# Patient Record
Sex: Female | Born: 1955 | Race: White | Hispanic: No | Marital: Married | Smoking: Never smoker
Health system: Southern US, Community
[De-identification: ages and names within clinical notes are randomized; demographics above are authoritative.]

## PROBLEM LIST (undated history)

## (undated) DIAGNOSIS — S02609A Fracture of mandible, unspecified, initial encounter for closed fracture: Secondary | ICD-10-CM

## (undated) DIAGNOSIS — M1990 Unspecified osteoarthritis, unspecified site: Secondary | ICD-10-CM

## (undated) DIAGNOSIS — F32A Depression, unspecified: Secondary | ICD-10-CM

## (undated) DIAGNOSIS — F419 Anxiety disorder, unspecified: Secondary | ICD-10-CM

## (undated) DIAGNOSIS — S0990XA Unspecified injury of head, initial encounter: Secondary | ICD-10-CM

## (undated) DIAGNOSIS — F329 Major depressive disorder, single episode, unspecified: Secondary | ICD-10-CM

## (undated) DIAGNOSIS — H269 Unspecified cataract: Secondary | ICD-10-CM

## (undated) DIAGNOSIS — I10 Essential (primary) hypertension: Secondary | ICD-10-CM

## (undated) HISTORY — DX: Depression, unspecified: F32.A

## (undated) HISTORY — DX: Unspecified injury of head, initial encounter: S09.90XA

## (undated) HISTORY — PX: COLONOSCOPY: SHX174

## (undated) HISTORY — DX: Major depressive disorder, single episode, unspecified: F32.9

## (undated) HISTORY — DX: Anxiety disorder, unspecified: F41.9

## (undated) HISTORY — PX: CATARACT EXTRACTION: SUR2

## (undated) HISTORY — PX: POLYPECTOMY: SHX149

## (undated) HISTORY — DX: Unspecified osteoarthritis, unspecified site: M19.90

## (undated) HISTORY — PX: ABDOMINAL HYSTERECTOMY: SHX81

## (undated) HISTORY — DX: Unspecified cataract: H26.9

## (undated) HISTORY — DX: Essential (primary) hypertension: I10

## (undated) HISTORY — PX: TOTAL HIP ARTHROPLASTY: SHX124

---

## 2004-03-07 ENCOUNTER — Other Ambulatory Visit: Admission: RE | Admit: 2004-03-07 | Discharge: 2004-03-07 | Payer: Self-pay | Admitting: Obstetrics & Gynecology

## 2005-01-27 ENCOUNTER — Ambulatory Visit: Payer: Self-pay | Admitting: Family Medicine

## 2005-02-13 ENCOUNTER — Ambulatory Visit: Payer: Self-pay | Admitting: Family Medicine

## 2005-03-24 ENCOUNTER — Ambulatory Visit: Payer: Self-pay | Admitting: Family Medicine

## 2005-03-24 ENCOUNTER — Encounter: Admission: RE | Admit: 2005-03-24 | Discharge: 2005-03-24 | Payer: Self-pay | Admitting: Family Medicine

## 2005-07-22 ENCOUNTER — Encounter: Admission: RE | Admit: 2005-07-22 | Discharge: 2005-07-22 | Payer: Self-pay | Admitting: Sports Medicine

## 2006-02-24 ENCOUNTER — Ambulatory Visit: Payer: Self-pay | Admitting: Family Medicine

## 2006-06-10 ENCOUNTER — Encounter: Admission: RE | Admit: 2006-06-10 | Discharge: 2006-06-10 | Payer: Self-pay | Admitting: Sports Medicine

## 2007-02-17 ENCOUNTER — Ambulatory Visit: Payer: Self-pay | Admitting: Family Medicine

## 2007-03-11 ENCOUNTER — Ambulatory Visit: Payer: Self-pay | Admitting: Family Medicine

## 2007-03-11 LAB — CONVERTED CEMR LAB
ALT: 25 units/L (ref 0–40)
AST: 21 units/L (ref 0–37)
Albumin: 3.8 g/dL (ref 3.5–5.2)
BUN: 7 mg/dL (ref 6–23)
Basophils Absolute: 0 10*3/uL (ref 0.0–0.1)
Eosinophils Absolute: 0.2 10*3/uL (ref 0.0–0.6)
Eosinophils Relative: 2.6 % (ref 0.0–5.0)
GFR calc Af Amer: 98 mL/min
GFR calc non Af Amer: 81 mL/min
HDL: 45.8 mg/dL (ref >39.0)
Hemoglobin: 15.7 g/dL — ABNORMAL HIGH (ref 12.0–15.0)
LDL Cholesterol: 46 mg/dL (ref 0–99)
Lymphocytes Relative: 15.5 % (ref 12.0–46.0)
MCHC: 34.3 g/dL (ref 30.0–36.0)
Monocytes Relative: 10.9 % (ref 3.0–11.0)
Neutro Abs: 4.2 10*3/uL (ref 1.4–7.7)
Neutrophils Relative %: 70.8 % (ref 43.0–77.0)
Platelets: 345 10*3/uL (ref 150–400)
Potassium: 3.9 meq/L (ref 3.5–5.1)
RBC: 4.91 M/uL (ref 3.87–5.11)
RDW: 12.4 % (ref 11.5–14.6)
TSH: 1.91 microintl units/mL (ref 0.35–5.50)
Total CHOL/HDL Ratio: 2.4
Total Protein: 6.7 g/dL (ref 6.0–8.3)
Triglycerides: 94 mg/dL (ref 0–149)
VLDL: 19 mg/dL (ref 0–40)

## 2007-04-05 ENCOUNTER — Ambulatory Visit: Payer: Self-pay | Admitting: Internal Medicine

## 2007-04-05 LAB — CONVERTED CEMR LAB
Amylase: 30 units/L (ref 27–131)
Basophils Relative: 0.3 % (ref 0.0–1.0)
H Pylori IgG: NEGATIVE
Hemoglobin: 15.3 g/dL — ABNORMAL HIGH (ref 12.0–15.0)
Lipase: 19 units/L (ref 11.0–59.0)
Lymphocytes Relative: 14.8 % (ref 12.0–46.0)
MCHC: 34.5 g/dL (ref 30.0–36.0)
Platelets: 338 10*3/uL (ref 150–400)
RBC: 4.7 M/uL (ref 3.87–5.11)
RDW: 12.5 % (ref 11.5–14.6)
WBC: 6.4 10*3/uL (ref 4.5–10.5)

## 2007-04-20 ENCOUNTER — Encounter: Admission: RE | Admit: 2007-04-20 | Discharge: 2007-04-20 | Payer: Self-pay | Admitting: Family Medicine

## 2007-04-21 ENCOUNTER — Ambulatory Visit: Payer: Self-pay | Admitting: Internal Medicine

## 2007-05-03 DIAGNOSIS — F418 Other specified anxiety disorders: Secondary | ICD-10-CM

## 2007-05-03 DIAGNOSIS — Z9079 Acquired absence of other genital organ(s): Secondary | ICD-10-CM | POA: Insufficient documentation

## 2007-05-03 DIAGNOSIS — F329 Major depressive disorder, single episode, unspecified: Secondary | ICD-10-CM | POA: Insufficient documentation

## 2007-05-03 DIAGNOSIS — J309 Allergic rhinitis, unspecified: Secondary | ICD-10-CM | POA: Insufficient documentation

## 2007-05-21 ENCOUNTER — Encounter: Payer: Self-pay | Admitting: Family Medicine

## 2007-06-02 ENCOUNTER — Encounter: Admission: RE | Admit: 2007-06-02 | Discharge: 2007-06-02 | Payer: Self-pay | Admitting: Occupational Medicine

## 2007-06-28 ENCOUNTER — Ambulatory Visit (HOSPITAL_COMMUNITY): Admission: RE | Admit: 2007-06-28 | Discharge: 2007-06-28 | Payer: Self-pay | Admitting: *Deleted

## 2007-06-28 ENCOUNTER — Encounter (INDEPENDENT_AMBULATORY_CARE_PROVIDER_SITE_OTHER): Payer: Self-pay | Admitting: *Deleted

## 2007-07-20 ENCOUNTER — Encounter: Payer: Self-pay | Admitting: Family Medicine

## 2007-11-12 ENCOUNTER — Ambulatory Visit: Payer: Self-pay | Admitting: Family Medicine

## 2007-11-12 DIAGNOSIS — J019 Acute sinusitis, unspecified: Secondary | ICD-10-CM | POA: Insufficient documentation

## 2007-12-02 ENCOUNTER — Ambulatory Visit: Payer: Self-pay | Admitting: Internal Medicine

## 2007-12-02 ENCOUNTER — Telehealth (INDEPENDENT_AMBULATORY_CARE_PROVIDER_SITE_OTHER): Payer: Self-pay | Admitting: *Deleted

## 2007-12-02 DIAGNOSIS — R300 Dysuria: Secondary | ICD-10-CM | POA: Insufficient documentation

## 2007-12-02 DIAGNOSIS — N390 Urinary tract infection, site not specified: Secondary | ICD-10-CM | POA: Insufficient documentation

## 2007-12-02 LAB — CONVERTED CEMR LAB
Bilirubin Urine: NEGATIVE
Nitrite: POSITIVE
Protein, U semiquant: NEGATIVE
Specific Gravity, Urine: 1.02
pH: 6.5

## 2007-12-04 ENCOUNTER — Encounter: Payer: Self-pay | Admitting: Internal Medicine

## 2007-12-10 ENCOUNTER — Telehealth (INDEPENDENT_AMBULATORY_CARE_PROVIDER_SITE_OTHER): Payer: Self-pay | Admitting: *Deleted

## 2007-12-16 HISTORY — PX: CHOLECYSTECTOMY: SHX55

## 2007-12-22 ENCOUNTER — Telehealth (INDEPENDENT_AMBULATORY_CARE_PROVIDER_SITE_OTHER): Payer: Self-pay | Admitting: *Deleted

## 2008-06-12 ENCOUNTER — Telehealth (INDEPENDENT_AMBULATORY_CARE_PROVIDER_SITE_OTHER): Payer: Self-pay | Admitting: *Deleted

## 2008-06-12 ENCOUNTER — Ambulatory Visit: Payer: Self-pay | Admitting: Family Medicine

## 2008-06-12 DIAGNOSIS — M79609 Pain in unspecified limb: Secondary | ICD-10-CM

## 2008-06-12 DIAGNOSIS — M109 Gout, unspecified: Secondary | ICD-10-CM | POA: Insufficient documentation

## 2008-06-13 ENCOUNTER — Encounter: Payer: Self-pay | Admitting: Family Medicine

## 2008-06-14 ENCOUNTER — Encounter (INDEPENDENT_AMBULATORY_CARE_PROVIDER_SITE_OTHER): Payer: Self-pay | Admitting: *Deleted

## 2008-06-15 ENCOUNTER — Encounter (INDEPENDENT_AMBULATORY_CARE_PROVIDER_SITE_OTHER): Payer: Self-pay | Admitting: *Deleted

## 2008-07-18 ENCOUNTER — Ambulatory Visit: Payer: Self-pay | Admitting: Internal Medicine

## 2008-07-22 ENCOUNTER — Encounter: Payer: Self-pay | Admitting: Internal Medicine

## 2008-10-02 ENCOUNTER — Telehealth (INDEPENDENT_AMBULATORY_CARE_PROVIDER_SITE_OTHER): Payer: Self-pay | Admitting: *Deleted

## 2008-10-02 ENCOUNTER — Ambulatory Visit: Payer: Self-pay | Admitting: Family Medicine

## 2008-10-02 DIAGNOSIS — I10 Essential (primary) hypertension: Secondary | ICD-10-CM

## 2008-10-10 ENCOUNTER — Encounter (INDEPENDENT_AMBULATORY_CARE_PROVIDER_SITE_OTHER): Payer: Self-pay | Admitting: *Deleted

## 2008-10-25 ENCOUNTER — Ambulatory Visit: Payer: Self-pay | Admitting: Family Medicine

## 2008-10-29 LAB — CONVERTED CEMR LAB
ALT: 18 units/L (ref 0–35)
AST: 16 units/L (ref 0–37)
Albumin: 3.5 g/dL (ref 3.5–5.2)
Alkaline Phosphatase: 60 units/L (ref 39–117)
HDL: 42.3 mg/dL (ref >39.0)
LDL Cholesterol: 46 mg/dL (ref 0–99)
Total Protein: 6.6 g/dL (ref 6.0–8.3)
Triglycerides: 77 mg/dL (ref 0–149)
VLDL: 15 mg/dL (ref 0–40)

## 2008-10-30 ENCOUNTER — Encounter (INDEPENDENT_AMBULATORY_CARE_PROVIDER_SITE_OTHER): Payer: Self-pay | Admitting: *Deleted

## 2008-11-29 LAB — HM MAMMOGRAPHY

## 2009-02-13 ENCOUNTER — Ambulatory Visit: Payer: Self-pay | Admitting: Family Medicine

## 2009-02-21 ENCOUNTER — Telehealth (INDEPENDENT_AMBULATORY_CARE_PROVIDER_SITE_OTHER): Payer: Self-pay | Admitting: *Deleted

## 2009-05-11 ENCOUNTER — Ambulatory Visit: Payer: Self-pay | Admitting: Family Medicine

## 2009-05-12 ENCOUNTER — Encounter: Payer: Self-pay | Admitting: Family Medicine

## 2009-05-23 ENCOUNTER — Telehealth: Payer: Self-pay | Admitting: Family Medicine

## 2009-06-05 ENCOUNTER — Ambulatory Visit: Payer: Self-pay | Admitting: Family Medicine

## 2009-06-07 ENCOUNTER — Encounter (INDEPENDENT_AMBULATORY_CARE_PROVIDER_SITE_OTHER): Payer: Self-pay | Admitting: *Deleted

## 2009-08-07 ENCOUNTER — Ambulatory Visit: Payer: Self-pay | Admitting: Family Medicine

## 2009-08-28 ENCOUNTER — Telehealth: Payer: Self-pay | Admitting: Family Medicine

## 2009-08-30 ENCOUNTER — Ambulatory Visit: Payer: Self-pay | Admitting: Family Medicine

## 2009-09-20 ENCOUNTER — Ambulatory Visit: Payer: Self-pay | Admitting: Family Medicine

## 2009-09-20 DIAGNOSIS — M2550 Pain in unspecified joint: Secondary | ICD-10-CM | POA: Insufficient documentation

## 2009-10-03 ENCOUNTER — Telehealth (INDEPENDENT_AMBULATORY_CARE_PROVIDER_SITE_OTHER): Payer: Self-pay | Admitting: *Deleted

## 2009-10-05 ENCOUNTER — Ambulatory Visit: Payer: Self-pay | Admitting: Family Medicine

## 2009-10-05 LAB — CONVERTED CEMR LAB
Glucose, Urine, Semiquant: NEGATIVE
Protein, U semiquant: NEGATIVE

## 2009-10-06 ENCOUNTER — Encounter: Payer: Self-pay | Admitting: Family Medicine

## 2009-10-09 ENCOUNTER — Telehealth: Payer: Self-pay | Admitting: Family Medicine

## 2009-10-12 ENCOUNTER — Ambulatory Visit: Payer: Self-pay | Admitting: Family Medicine

## 2009-10-12 LAB — CONVERTED CEMR LAB
ALT: 22 units/L (ref 0–35)
AST: 18 units/L (ref 0–37)
Albumin: 3.7 g/dL (ref 3.5–5.2)
BUN: 11 mg/dL (ref 6–23)
Basophils Absolute: 0 10*3/uL (ref 0.0–0.1)
Basophils Relative: 0.6 % (ref 0.0–3.0)
Bilirubin, Direct: 0 mg/dL (ref 0.0–0.3)
Calcium: 8.5 mg/dL (ref 8.4–10.5)
Chloride: 107 meq/L (ref 96–112)
Eosinophils Absolute: 0.1 10*3/uL (ref 0.0–0.7)
GFR calc non Af Amer: 93.08 mL/min (ref >60)
Glucose, Bld: 88 mg/dL (ref 70–99)
HCT: 44 % (ref 36.0–46.0)
HDL: 43.3 mg/dL (ref >39.00)
Hemoglobin: 15.2 g/dL — ABNORMAL HIGH (ref 12.0–15.0)
LDL Cholesterol: 37 mg/dL (ref 0–99)
Lymphocytes Relative: 15.5 % (ref 12.0–46.0)
Lymphs Abs: 1.1 10*3/uL (ref 0.7–4.0)
Monocytes Relative: 7.5 % (ref 3.0–12.0)
Neutrophils Relative %: 75.1 % (ref 43.0–77.0)
Potassium: 3.7 meq/L (ref 3.5–5.1)
RBC: 4.45 M/uL (ref 3.87–5.11)
RDW: 11.6 % (ref 11.5–14.6)
TSH: 1.76 microintl units/mL (ref 0.35–5.50)
Total Bilirubin: 0.6 mg/dL (ref 0.3–1.2)
Total CHOL/HDL Ratio: 2
Total Protein: 6.6 g/dL (ref 6.0–8.3)
Uric Acid, Serum: 5.7 mg/dL (ref 2.4–7.0)
VLDL: 22.6 mg/dL (ref 0.0–40.0)
WBC: 7.2 10*3/uL (ref 4.5–10.5)

## 2009-10-15 ENCOUNTER — Telehealth (INDEPENDENT_AMBULATORY_CARE_PROVIDER_SITE_OTHER): Payer: Self-pay | Admitting: *Deleted

## 2009-10-15 ENCOUNTER — Encounter (INDEPENDENT_AMBULATORY_CARE_PROVIDER_SITE_OTHER): Payer: Self-pay | Admitting: *Deleted

## 2009-10-16 ENCOUNTER — Encounter (INDEPENDENT_AMBULATORY_CARE_PROVIDER_SITE_OTHER): Payer: Self-pay | Admitting: *Deleted

## 2009-11-02 ENCOUNTER — Telehealth: Payer: Self-pay | Admitting: Family Medicine

## 2009-12-03 ENCOUNTER — Telehealth: Payer: Self-pay | Admitting: Family Medicine

## 2009-12-21 ENCOUNTER — Ambulatory Visit: Payer: Self-pay | Admitting: Family Medicine

## 2010-01-22 ENCOUNTER — Ambulatory Visit: Payer: Self-pay | Admitting: Family Medicine

## 2010-02-06 ENCOUNTER — Telehealth: Payer: Self-pay | Admitting: Family Medicine

## 2010-02-06 ENCOUNTER — Ambulatory Visit: Payer: Self-pay | Admitting: Internal Medicine

## 2010-02-06 DIAGNOSIS — L299 Pruritus, unspecified: Secondary | ICD-10-CM | POA: Insufficient documentation

## 2010-02-07 ENCOUNTER — Telehealth (INDEPENDENT_AMBULATORY_CARE_PROVIDER_SITE_OTHER): Payer: Self-pay | Admitting: *Deleted

## 2010-02-07 ENCOUNTER — Encounter: Payer: Self-pay | Admitting: Internal Medicine

## 2010-04-12 ENCOUNTER — Emergency Department (HOSPITAL_COMMUNITY): Admission: EM | Admit: 2010-04-12 | Discharge: 2010-04-12 | Payer: Self-pay | Admitting: Family Medicine

## 2010-04-12 ENCOUNTER — Telehealth (INDEPENDENT_AMBULATORY_CARE_PROVIDER_SITE_OTHER): Payer: Self-pay | Admitting: *Deleted

## 2010-06-04 ENCOUNTER — Ambulatory Visit: Payer: Self-pay | Admitting: Family Medicine

## 2010-06-04 DIAGNOSIS — R51 Headache: Secondary | ICD-10-CM

## 2010-06-04 DIAGNOSIS — R519 Headache, unspecified: Secondary | ICD-10-CM | POA: Insufficient documentation

## 2010-06-26 ENCOUNTER — Ambulatory Visit: Payer: Self-pay | Admitting: Family Medicine

## 2010-06-28 LAB — CONVERTED CEMR LAB
BUN: 18 mg/dL (ref 6–23)
CO2: 25 meq/L (ref 19–32)
Chloride: 105 meq/L (ref 96–112)

## 2010-07-03 ENCOUNTER — Telehealth (INDEPENDENT_AMBULATORY_CARE_PROVIDER_SITE_OTHER): Payer: Self-pay | Admitting: *Deleted

## 2010-07-08 ENCOUNTER — Ambulatory Visit: Payer: Self-pay | Admitting: Family Medicine

## 2010-07-08 ENCOUNTER — Encounter (INDEPENDENT_AMBULATORY_CARE_PROVIDER_SITE_OTHER): Payer: Self-pay | Admitting: *Deleted

## 2010-07-09 LAB — CONVERTED CEMR LAB
Calcium: 9.1 mg/dL (ref 8.4–10.5)
Chloride: 106 meq/L (ref 96–112)
Glucose, Bld: 88 mg/dL (ref 70–99)
Potassium: 4.3 meq/L (ref 3.5–5.1)
Sodium: 139 meq/L (ref 135–145)

## 2010-07-19 ENCOUNTER — Encounter (INDEPENDENT_AMBULATORY_CARE_PROVIDER_SITE_OTHER): Payer: Self-pay

## 2010-07-23 ENCOUNTER — Ambulatory Visit: Payer: Self-pay | Admitting: Gastroenterology

## 2010-07-31 ENCOUNTER — Ambulatory Visit: Payer: Self-pay | Admitting: Gastroenterology

## 2010-08-02 ENCOUNTER — Encounter: Payer: Self-pay | Admitting: Gastroenterology

## 2010-08-12 ENCOUNTER — Telehealth (INDEPENDENT_AMBULATORY_CARE_PROVIDER_SITE_OTHER): Payer: Self-pay | Admitting: *Deleted

## 2010-09-16 ENCOUNTER — Ambulatory Visit: Payer: Self-pay | Admitting: Family Medicine

## 2010-11-04 ENCOUNTER — Telehealth (INDEPENDENT_AMBULATORY_CARE_PROVIDER_SITE_OTHER): Payer: Self-pay | Admitting: *Deleted

## 2011-01-12 LAB — CONVERTED CEMR LAB
Anti Nuclear Antibody(ANA): NEGATIVE
BUN: 11 mg/dL (ref 6–23)
Chloride: 103 meq/L (ref 96–112)
Creatinine, Ser: 0.7 mg/dL (ref 0.4–1.2)
Pap Smear: NORMAL
Potassium: 3.4 meq/L — ABNORMAL LOW (ref 3.5–5.1)
Sed Rate: 22 mm/hr (ref 0–22)
Sodium: 141 meq/L (ref 135–145)

## 2011-01-14 NOTE — Assessment & Plan Note (Signed)
Summary: FOR A BP CHECK//PH   Vital Signs:  Patient profile:   55 year old female Weight:      229 pounds Temp:     98.4 degrees F oral Pulse rate:   78 / minute Pulse rhythm:   regular BP sitting:   132 / 86  (left arm) Cuff size:   large  Vitals Entered By: Army Fossa CMA (January 22, 2010 4:06 PM) CC: Pt here for follow up on BP    History of Present Illness:  Hypertension follow-up      This is a 55 year old woman who presents for Hypertension follow-up.  The patient denies lightheadedness, urinary frequency, headaches, edema, impotence, rash, and fatigue.  The patient denies the following associated symptoms: chest pain, chest pressure, exercise intolerance, dyspnea, palpitations, syncope, leg edema, and pedal edema.  Compliance with medications (by patient report) has been sporadic.  The patient reports that dietary compliance has been fair.  Adjunctive measures currently used by the patient include salt restriction.    Current Medications (verified): 1)  Cymbalta 30 Mg  Cpep (Duloxetine Hcl) .... Take Three Times A Day 2)  Wellbutrin Xl 150 Mg Xr24h-Tab (Bupropion Hcl) .Marland Kitchen.. 1 By Mouth Once Daily 3)  Ambien 10 Mg  Tabs (Zolpidem Tartrate) .... Prn 4)  Adderall Xr 10 Mg Xr24h-Cap (Amphetamine-Dextroamphetamine) .... Take One Tablet Daily. 5)  Claritin 10 Mg Tabs (Loratadine) .Marland Kitchen.. 1 By Mouth Once Daily 6)  Diovan Hct 320-12.5 Mg Tabs (Valsartan-Hydrochlorothiazide) .Marland Kitchen.. 1 By Mouth Once Daily 7)  Vitamin D (Ergocalciferol) 50000 Unit Caps (Ergocalciferol) .... Take 1 Tab Weekly 8)  Celebrex 200 Mg Caps (Celecoxib) .Marland Kitchen.. 1 By Mouth Once Daily Prn  Allergies (verified): No Known Drug Allergies  Past History:  Past medical, surgical, family and social histories (including risk factors) reviewed for relevance to current acute and chronic problems.  Past Medical History: Reviewed history from 10/02/2008 and no changes required. Depression Allergic  rhinitis Hypertension  Family History: Reviewed history from 10/02/2008 and no changes required. M-- CLL Family History Hypertension  Social History: Reviewed history from 10/02/2008 and no changes required. 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 Lungs are clear to auscultation, no crackles or wheezes. Heart:  normal rate and no murmur.   Extremities:  No clubbing, cyanosis, edema, or deformity noted with normal full range of motion of all joints.   Psych:  Oriented X3 and normally interactive.     Impression & Recommendations:  Problem # 1:  HYPERTENSION (ICD-401.9)  Her updated medication list for this problem includes:    Diovan Hct 320-12.5 Mg Tabs (Valsartan-hydrochlorothiazide) .Marland Kitchen... 1 by mouth once daily  BP today: 132/86 Prior BP: 126/86 (12/21/2009)  Labs Reviewed: K+: 3.7 (10/05/2009) Creat: : 0.7 (10/05/2009)   Chol: 103 (10/05/2009)   HDL: 43.30 (10/05/2009)   LDL: 37 (10/05/2009)   TG: 113.0 (10/05/2009)  Complete Medication List: 1)  Cymbalta 30 Mg Cpep (Duloxetine hcl) .... Take three times a day 2)  Wellbutrin Xl 150 Mg Xr24h-tab (Bupropion hcl) .Marland Kitchen.. 1 by mouth once daily 3)  Ambien 10 Mg Tabs (Zolpidem tartrate) .... Prn 4)  Adderall Xr 10 Mg Xr24h-cap (Amphetamine-dextroamphetamine) .... Take one tablet daily. 5)  Claritin 10 Mg Tabs (Loratadine) .Marland Kitchen.. 1 by mouth once daily 6)  Diovan Hct 320-12.5 Mg Tabs (Valsartan-hydrochlorothiazide) .Marland Kitchen.. 1 by mouth once daily 7)  Vitamin D (ergocalciferol) 50000 Unit Caps (Ergocalciferol) .... Take 1 tab weekly 8)  Celebrex 200 Mg Caps (Celecoxib) .Marland Kitchen.. 1 by mouth once daily prn  Patient Instructions: 1)  rto 2-3 weeks Prescriptions: DIOVAN  HCT 320-12.5 MG TABS (VALSARTAN-HYDROCHLOROTHIAZIDE) 1 by mouth once daily  #30 x 0   Entered and Authorized by:   Loreen Freud DO   Signed by:   Loreen Freud DO on 01/22/2010   Method used:   Historical   RxID:   [id number]

## 2011-01-14 NOTE — Assessment & Plan Note (Signed)
Summary: rto bp/cbs   Vital Signs:  Patient profile:   55 year old female Weight:      223 pounds Pulse rate:   114 / minute BP sitting:   140 / 98  (left arm)  Vitals Entered By: Doristine Devoid CMA (July 08, 2010 1:21 PM) CC: roa bp    History of Present Illness: 55 yo woman here today to f/u BP.  HAs improved w/ addition Losartan, especially after doubling meds.  BP still running 150s/90s.  no CP, SOB, visual changes, edema.  has never been on HCTZ.  Current Medications (verified): 1)  Cymbalta 30 Mg  Cpep (Duloxetine Hcl) .... Take Three Times A Day 2)  Wellbutrin Xl 150 Mg Xr24h-Tab (Bupropion Hcl) .Marland Kitchen.. 1 By Mouth Once Daily 3)  Ambien 10 Mg  Tabs (Zolpidem Tartrate) .... Prn 4)  Adderall Xr 10 Mg Xr24h-Cap (Amphetamine-Dextroamphetamine) .... Take One Tablet Daily. 5)  Vitamin D (Ergocalciferol) 50000 Unit Caps (Ergocalciferol) .... Take 1 Tab Weekly 6)  Fexofenadine Hcl 180 Mg Tabs (Fexofenadine Hcl) .Marland Kitchen.. 1 Once Daily 7)  Knee Sleeve R Knee .... Dx Knee Pain 8)  Losartan Potassium 100 Mg Tabs (Losartan Potassium) .... Take One Tablet Daily 9)  Hydrochlorothiazide 12.5 Mg  Tabs (Hydrochlorothiazide) .... Take 1 Tab  By Mouth Every Morning  Allergies (verified): 1)  ! Lisinopril (Lisinopril) 2)  ! Metoprolol Tartrate (Metoprolol Tartrate) 3)  ! 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 Lungs are clear to auscultation, no crackles or wheezes. Heart:  normal rate and no murmur.   Pulses:  +2 carotid, radial,  DP Extremities:  no C/C/E   Impression & Recommendations:  Problem # 1:  HYPERTENSION (ICD-401.9) Assessment Unchanged pt now asymptomatic but BP control not adequate.  add HCTZ and follow closely.  repeat BMP to assess Cr since starting ARB. Her updated medication list for this problem includes:    Losartan Potassium 100 Mg Tabs (Losartan potassium) .Marland Kitchen... Take one tablet daily    Hydrochlorothiazide 12.5 Mg Tabs (Hydrochlorothiazide) .Marland Kitchen... Take 1 tab  by mouth every morning  Orders: Venipuncture (04540) Specimen Handling (98119) TLB-BMP (Basic Metabolic Panel-BMET) (80048-METABOL)  Problem # 2:  SCREENING, COLON CANCER (ICD-V76.51) Assessment: New needs referral, would like to have this done before school starts. Orders: Gastroenterology Referral (GI)  Complete Medication List: 1)  Cymbalta 30 Mg Cpep (Duloxetine hcl) .... Take three times a day 2)  Wellbutrin Xl 150 Mg Xr24h-tab (Bupropion hcl) .Marland Kitchen.. 1 by mouth once daily 3)  Ambien 10 Mg Tabs (Zolpidem tartrate) .... Prn 4)  Adderall Xr 10 Mg Xr24h-cap (Amphetamine-dextroamphetamine) .... Take one tablet daily. 5)  Vitamin D (ergocalciferol) 50000 Unit Caps (Ergocalciferol) .... Take 1 tab weekly 6)  Fexofenadine Hcl 180 Mg Tabs (Fexofenadine hcl) .Marland Kitchen.. 1 once daily 7)  Knee Sleeve R Knee  .... Dx knee pain 8)  Losartan Potassium 100 Mg Tabs (Losartan potassium) .... Take one tablet daily 9)  Hydrochlorothiazide 12.5 Mg Tabs (Hydrochlorothiazide) .... Take 1 tab  by mouth every morning  Patient Instructions: 1)  Please schedule a follow-up appointment in 1 month to recheck BP. 2)  Continue the Losartan 3)  Add the Hydrochlorothiazide 4)  Continue to drink plenty of fluids 5)  Someone will call you with your GI appt 6)  Hang in there!!  Prescriptions: HYDROCHLOROTHIAZIDE 12.5 MG  TABS (HYDROCHLOROTHIAZIDE) Take 1 tab  by mouth every morning  #30 x 3   Entered and Authorized by:   Neena Rhymes MD   Signed by:    Neena Rhymes MD on 07/08/2010   Method used:   Electronically to        [facility] [address] #[id number]* (retail)       [address]       [place], [place]  [address]       Ph: [phone]       Fax: [phone]   RxID:   [id number]

## 2011-01-14 NOTE — Procedures (Signed)
Summary: Colonoscopy  Patient: Diana Vincent Note: All result statuses are Final unless otherwise noted.  Tests: (1) Colonoscopy (COL)   COL Colonoscopy           DONE     [facility]     [address]     [place], [place]  [address]           COLONOSCOPY PROCEDURE REPORT           PATIENT:  Diana Vincent, Diana Vincent  MR#:  [id number]     BIRTHDATE:  19-Apr-1956, 53 yrs. old  GENDER:  female     ENDOSCOPIST:  Vania Rea. Jarold Motto, MD, [facility]     REF. BY:  Marga Melnick, M.D.     PROCEDURE DATE:  07/31/2010     PROCEDURE:  Colonoscopy with snare polypectomy     ASA CLASS:  Class II     INDICATIONS:  Routine Risk Screening     MEDICATIONS:   Fentanyl 75 mcg IV, Versed 7 mg IV           DESCRIPTION OF PROCEDURE:   After the risks benefits and     alternatives of the procedure were thoroughly explained, informed     consent was obtained.  Digital rectal exam was performed and     revealed no abnormalities.   The LB [id number] endoscope     was introduced through the anus and advanced to the cecum, which     was identified by both the appendix and ileocecal valve, without     limitations.  The quality of the prep was adequate, using     MoviPrep.  The instrument was then slowly withdrawn as the colon     was fully examined.     <<PROCEDUREIMAGES>>           FINDINGS:  A sessile polyp was found in the descending colon. 4mm     flat polyp cold snare excised.  Scattered diverticula were found     in the sigmoid colon.  This was otherwise a normal examination of     the colon.   Retroflexed views in the rectum revealed no     abnormalities.    The scope was then withdrawn from the patient     and the procedure completed.           COMPLICATIONS:  None     ENDOSCOPIC IMPRESSION:     1) Sessile polyp in the descending colon     2) Diverticula, scattered in the sigmoid colon     3) Otherwise normal examination     R/O ADENOMA.     RECOMMENDATIONS:     1) Repeat colonoscopy in 5 years  if polyp adenomatous; otherwise     10 years     2) metamucil or benefiber     3) high fiber diet     REPEAT EXAM:  No           ______________________________     Vania Rea. Jarold Motto, MD, [facility]           CC:           n.     eSIGNED:   Vania Rea. Patterson at 07/31/2010 11:27 AM           [name], [name], [id number]  Note: An exclamation mark (!) indicates a result that was not dispersed into the flowsheet. Document Creation Date: 07/31/2010 11:29 AM _______________________________________________________________________  (1) Order result status: Final Collection  or observation date-time: 07/31/2010 11:21 Requested date-time:  Receipt date-time:  Reported date-time:  Referring Physician:   Ordering Physician: Sheryn Bison [phone]) Specimen Source:  Source: Launa Grill Order Number: [id number] Lab site:   Appended Document: Colonoscopy CC: Dr. Beverely Low  Appended Document: Colonoscopy 5Y F/U  Appended Document: Colonoscopy     Procedures Next Due Date:    Colonoscopy: 07/2015

## 2011-01-14 NOTE — Progress Notes (Signed)
Summary: BP MED NOT WORKING  Phone Note Call from Patient Call back at Home Phone [phone]   Caller: Patient Call For: Loreen Freud DO Reason for Call: Talk to Nurse Summary of Call: PATIENT CALLING, WANTS AN APPT FOR BP RECHECK, STATES THE CURRENT BP MED DIOVAN SHE IS ON IS "WEARING HER OUT".  PATIENT IS ALSO SICK W/FLU LIKE SYPMTOMS.  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 Initial call taken by: Magdalen Spatz [facility],  February 06, 2010 9:45 AM  Follow-up for Phone Call        pt c/o of itching all over body and scalp, falling into deep sleep easily since starting diovan. pt states that all symptoms started a week after starting new BP med. Diovan is controlling BP. pt does have pending OV this afternoon with dr hopper to discuss reaction to med. pls advise ....................Marland KitchenFelecia Deloach CMA  February 06, 2010 10:01 AM

## 2011-01-14 NOTE — Progress Notes (Signed)
Summary: wants to increase bp med dose  Phone Note Call from Patient Call back at Home Phone [phone]   Summary of Call: patient started taking losartin 50 mg - bp 155/98 she wants to know if she can increase dose Initial call taken by: Okey Regal Spring,  July 03, 2010 4:44 PM  Follow-up for Phone Call        can double up and take 2 pills (100mg  daily) Follow-up by: Neena Rhymes MD,  July 03, 2010 4:50 PM  Additional Follow-up for Phone Call Additional follow up Details #1::        spoke w/ patient aware she can increase to 100mg  of losartan and let us know how that improves bp....Marland KitchenMarland KitchenDoristine Devoid CMA  July 03, 2010 4:55 PM     New/Updated Medications: LOSARTAN POTASSIUM 100 MG TABS (LOSARTAN POTASSIUM) take one tablet daily

## 2011-01-14 NOTE — Letter (Signed)
Summary: Patient Notice- Polyp Results  [facility]  [address] [place], [place] [address]   Phone: [phone]  Fax: [phone]        August 02, 2010 MRN: [id number]    [facility] [address] [place], [place]  [address]    Dear Ms. Vane,  I am pleased to inform you that the colon polyp(s) removed during your recent colonoscopy was (were) found to be benign (no cancer detected) upon pathologic examination.  I recommend you have a repeat colonoscopy examination in 5_ years to look for recurrent polyps, as having colon polyps increases your risk for having recurrent polyps or even colon cancer in the future.  Should you develop new or worsening symptoms of abdominal pain, bowel habit changes or bleeding from the rectum or bowels, please schedule an evaluation with either your primary care physician or with me.  Additional information/recommendations:  _X_ No further action with gastroenterology is needed at this time. Please      follow-up with your primary care physician for your other healthcare      needs.  __ Please call [phone] to schedule a return visit to review your      situation.  __ Please keep your follow-up visit as already scheduled.  __ Continue treatment plan as outlined the day of your exam.  Please call us if you are having persistent problems or have questions about your condition that have not been fully answered at this time.  Sincerely,  Mardella Layman MD [facility]  This letter has been electronically signed by your physician.  Appended Document: Patient Notice- Polyp Results letter mailed

## 2011-01-14 NOTE — Progress Notes (Signed)
Summary: hctz refill   Phone Note Refill Request Message from:  Fax from Pharmacy on November 04, 2010 8:29 AM  Refills Requested: Medication #1:  HYDROCHLOROTHIAZIDE 12.5 MG  TABS Take 1 tab  by mouth every morning. [facility] [id number] Jani Files [id number]  Initial call taken by: Okey Regal Spring,  November 04, 2010 8:30 AM    Prescriptions: HYDROCHLOROTHIAZIDE 12.5 MG  TABS (HYDROCHLOROTHIAZIDE) Take 1 tab  by mouth every morning  #30 x 6   Entered by:   Doristine Devoid CMA   Authorized by:   Neena Rhymes MD   Signed by:   Doristine Devoid CMA on 11/04/2010   Method used:   Electronically to        [facility] [address] #[id number]* (retail)       [address]       [place], [place]  [address]       Ph: [phone]       Fax: [phone]   RxID:   [id number]

## 2011-01-14 NOTE — Assessment & Plan Note (Signed)
Summary: FOR A BP CHECK//PH   Vital Signs:  Patient profile:   55 year old female Weight:      229 pounds Temp:     100 degrees F oral Pulse rate:   77 / minute Pulse rhythm:   regular BP sitting:   126 / 86  (left arm) Cuff size:   large  Vitals Entered By: Army Fossa CMA (December 21, 2009 3:57 PM) CC: check bp   History of Present Illness:  Hypertension follow-up      This is a 55 year old woman who presents for Hypertension follow-up.  The patient denies lightheadedness, urinary frequency, headaches, edema, impotence, rash, and fatigue.  Associated symptoms include leg edema.  The patient denies the following associated symptoms: chest pain, chest pressure, exercise intolerance, dyspnea, palpitations, syncope, and pedal edema.  Compliance with medications (by patient report) has been near 100%.  The patient reports that dietary compliance has been good.  Adjunctive measures currently used by the patient include salt restriction.    Current Medications (verified): 1)  Cymbalta 30 Mg  Cpep (Duloxetine Hcl) .... Take Three Times A Day 2)  Wellbutrin Xl 300 Mg  Tb24 (Bupropion Hcl) .... Take One Tablet Daily 3)  Ambien 10 Mg  Tabs (Zolpidem Tartrate) .... Prn 4)  Adderall Xr 10 Mg Xr24h-Cap (Amphetamine-Dextroamphetamine) .... Take One Tablet Daily. 5)  Claritin 10 Mg Tabs (Loratadine) .Marland Kitchen.. 1 By Mouth Once Daily 6)  Diovan Hct 160-12.5 Mg Tabs (Valsartan-Hydrochlorothiazide) .Marland Kitchen.. 1 By Mouth Once Daily 7)  Vitamin D (Ergocalciferol) 50000 Unit Caps (Ergocalciferol) .... Take 1 Tab Weekly 8)  Celebrex 200 Mg Caps (Celecoxib) .Marland Kitchen.. 1 By Mouth Once Daily Prn  Allergies (verified): No Known Drug Allergies  Past History:  Past medical, surgical, family and social histories (including risk factors) reviewed for relevance to current acute and chronic problems.  Past Medical History: Reviewed history from 10/02/2008 and no changes required. Depression Allergic  rhinitis Hypertension  Family History: Reviewed history from 10/02/2008 and no changes required. M-- CLL Family History Hypertension  Social History: Reviewed history from 10/02/2008 and no changes required. 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   Psych:  Oriented X3 and normally interactive.     Impression & Recommendations:  Problem # 1:  HYPERTENSION (ICD-401.9)  The following medications were removed from the medication list:    Toprol Xl 50 Mg Xr24h-tab (Metoprolol succinate) .Marland Kitchen... 1/2  by mouth once daily Her updated medication list for this problem includes:    Diovan Hct 160-12.5 Mg Tabs (Valsartan-hydrochlorothiazide) .Marland Kitchen... 1 by mouth once daily  Problem # 2:  PAIN IN JOINT, MULTIPLE SITES (ICD-719.49) ? OA-- celebrex 200mg  once daily---    Complete Medication List: 1)  Cymbalta 30 Mg Cpep (Duloxetine hcl) .... Take three times a day 2)  Wellbutrin Xl 300 Mg Tb24 (Bupropion hcl) .... Take one tablet daily 3)  Ambien 10 Mg Tabs (Zolpidem tartrate) .... Prn 4)  Adderall Xr 10 Mg Xr24h-cap (Amphetamine-dextroamphetamine) .... Take one tablet daily. 5)  Claritin 10 Mg Tabs (Loratadine) .Marland Kitchen.. 1 by mouth once daily 6)  Diovan Hct 160-12.5 Mg Tabs (Valsartan-hydrochlorothiazide) .Marland Kitchen.. 1 by mouth once daily 7)  Vitamin D (ergocalciferol) 50000 Unit Caps (Ergocalciferol) .... Take 1 tab weekly 8)  Celebrex 200  Mg Caps (Celecoxib) .Marland Kitchen.. 1 by mouth once daily prn  Patient Instructions: 1)  rto 2-3 weeks Prescriptions: CELEBREX 200 MG CAPS (CELECOXIB) 1 by mouth once daily prn  #30 x 5   Entered and Authorized by:   Loreen Freud DO   Signed by:   Loreen Freud DO on 12/21/2009   Method used:   Print  then Give to Patient   RxID:   [id number]

## 2011-01-14 NOTE — Letter (Signed)
Summary: Previsit letter  [facility]  [address] [place], [place] [address]   Phone: [phone]  Fax: [phone]       07/08/2010 MRN: [id number]  [facility] [address] [place], [place]  [address]  Dear Diana Vincent,  Welcome to the Gastroenterology Division at [facility].    You are scheduled to see a nurse for your pre-procedure visit on 07/23/2010 at 8:00AM on the 3rd floor at [facility], [address]  We ask that you try to arrive at our office 15 minutes prior to your appointment time to allow for check-in.  Your nurse visit will consist of discussing your medical and surgical history, your immediate family medical history, and your medications.    Please bring a complete list of all your medications or, if you prefer, bring the medication bottles and we will list them.  We will need to be aware of both prescribed and over the counter drugs.  We will need to know exact dosage information as well.  If you are on blood thinners (Coumadin, Plavix, Aggrenox, Ticlid, etc.) please call our office today/prior to your appointment, as we need to consult with your physician about holding your medication.   Please be prepared to read and sign documents such as consent forms, a financial agreement, and acknowledgement forms.  If necessary, and with your consent, a friend or relative is welcome to sit-in on the nurse visit with you.  Please bring your insurance card so that we may make a copy of it.  If your insurance requires a referral to see a specialist, please bring your referral form from your primary care physician.  No co-pay is required for this nurse visit.     If you cannot keep your appointment, please call [phone] to cancel or reschedule prior to your appointment date.  This allows [place] the opportunity to schedule an appointment for another patient in need of care.    Thank you for choosing [facility] for your medical needs.   We appreciate the opportunity to care for you.  Please visit [place] at our website  to learn more about our practice.                     Sincerely.                                                                                                                   The Gastroenterology Division

## 2011-01-14 NOTE — Assessment & Plan Note (Signed)
Summary: reaction to Bp med//fd   Vital Signs:  Patient profile:   55 year old female Weight:      228 pounds BMI:     35.84 Temp:     98.6 degrees F oral Pulse rate:   94 / minute Resp:     15 per minute BP sitting:   122 / 80  (left arm) Cuff size:   large  Vitals Entered By: Shonna Chock (February 06, 2010 4:24 PM) CC: 1.) Patient  ?'s if she is having a reaction to Diovan (still taking as of today)  2.) Cold/ Sinus Infection x 2-3 days, URI symptoms Comments REVIEWED MED LIST, PATIENT AGREED DOSE AND INSTRUCTION CORRECT    Primary Care Provider:  Laury Axon  CC:  1.) Patient  ?'s if she is having a reaction to Diovan (still taking as of today)  2.) Cold/ Sinus Infection x 2-3 days and URI symptoms.  History of Present Illness: She is having pruritis w/o visable rash  of scalp X 1 week ;no treatment except Scalpitan.  Diovan was initiated 3 weeks ago. No skin issues beyond scalp. Lice  infestations @ her school last week; Beautician saw no nits. PMH of seasonal allergies; Claritin as needed.  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  The patient denies fever, stiff neck, dyspnea, wheezing, rash, vomiting, and diarrhea.  The patient also reports itchy watery eyes, itchy throat, sneezing, seasonal symptoms, headache, muscle aches, and severe fatigue.  The patient denies response to antihistamine (Claritin) but Nasachrom did somewhat.  Risk factors for Strep sinusitis include bilateral facial pain.  The patient denies the following risk factors for Strep sinusitis: unilateral nasal discharge, double sickening, tooth pain, Strep exposure, and tender adenopathy.  Tinnitus worse with URI. Issues with BP meds  reviewed & entered into Allery List  Allergies (verified): 1)  ! Lisinopril (Lisinopril) 2)  ! Metoprolol Tartrate  (Metoprolol Tartrate) 3)  ! Amlodipine Besylate (Amlodipine Besylate)  Review of Systems Derm:  Complains of changes in color of skin; denies lesion(s).  Physical Exam  General:  Appears fatigued well-nourished,in no acute distress; alert,appropriate and cooperative throughout examination Ears:  External ear exam shows no significant lesions or deformities.  Otoscopic examination reveals clear canals, tympanic membranes are intact bilaterally without bulging, retraction, inflammation or discharge. Hearing is grossly normal bilaterally. Nose:  External nasal examination shows no deformity or inflammation. Nasal mucosa are dry  without lesions or exudates. Constantly sniffling. Mouth:  Oral mucosa and oropharynx without lesions or exudates.  Teeth in good repair. Lungs:  Normal respiratory effort, chest expands symmetrically. Lungs are clear to auscultation, no crackles or wheezes. Skin:  Intact without suspicious lesions or rashes; scalp clear Cervical Nodes:  No lymphadenopathy noted Axillary Nodes:  No palpable lymphadenopathy   Impression & Recommendations:  Problem # 1:  PRURITUS (ICD-698.9) of scalp, doubt drug reaction  Problem # 2:  ALLERGIC RHINITIS (ICD-477.9) doubt sinusitis Her updated medication list for this problem includes:    Claritin 10 Mg Tabs (Loratadine) .Marland Kitchen... 1 by mouth once daily    Fexofenadine Hcl 180 Mg Tabs (Fexofenadine hcl) .Marland Kitchen... 1 once daily  Problem # 3:  HYPERTENSION (ICD-401.9)  The following medications were removed from the medication list:    Diovan Hct 320-12.5 Mg Tabs (Valsartan-hydrochlorothiazide) .Marland Kitchen... 1 by mouth once daily Her updated  medication list for this problem includes:    Clonidine Hcl 0.1 Mg Tabs (Clonidine hcl) .Marland Kitchen... 1 two times a day  Complete Medication List: 1)  Cymbalta 30 Mg Cpep (Duloxetine hcl) .... Take three times a day 2)  Wellbutrin Xl 150 Mg Xr24h-tab (Bupropion hcl) .Marland Kitchen.. 1 by mouth once daily 3)  Ambien 10 Mg Tabs  (Zolpidem tartrate) .... Prn 4)  Adderall Xr 10 Mg Xr24h-cap (Amphetamine-dextroamphetamine) .... Take one tablet daily. 5)  Claritin 10 Mg Tabs (Loratadine) .Marland Kitchen.. 1 by mouth once daily 6)  Vitamin D (ergocalciferol) 50000 Unit Caps (Ergocalciferol) .... Take 1 tab weekly 7)  Clonidine Hcl 0.1 Mg Tabs (Clonidine hcl) .Marland Kitchen.. 1 two times a day 8)  Fexofenadine Hcl 180 Mg Tabs (Fexofenadine hcl) .Marland Kitchen.. 1 once daily  Patient Instructions: 1)  Drink as much fluid as you can tolerate for the next few days. Neti pot once daily for head congestion. Patanase sample 2 sprays two times a day ; Singulair 10 mg once daily .Substitute Fexofenedine for Loratidine. Coal Tar shampoo 2X/week for itching scalp Prescriptions: FEXOFENADINE HCL 180 MG TABS (FEXOFENADINE HCL) 1 once daily  #30 x 11   Entered and Authorized by:   Marga Melnick MD   Signed by:   Marga Melnick MD on 02/06/2010   Method used:   Faxed to ...       [facility] [phone]* (retail)       [address]       [place], [place]  [address]       Ph: [phone]       Fax: [phone]   RxID:   [id number] CLONIDINE HCL 0.1 MG TABS (CLONIDINE HCL) 1 two times a day  #60 x 2   Entered and Authorized by:   Marga Melnick MD   Signed by:   Marga Melnick MD on 02/06/2010   Method used:   Faxed to ...       [facility] #[id number]* (retail)       [address]       [place], [place]  [address]       Ph: [phone]       Fax: [phone]   RxID:   [id number]

## 2011-01-14 NOTE — Assessment & Plan Note (Signed)
Summary: HAVING BP PROBLEMS GOING UP, HAS HEADACHE///SPH   Vital Signs:  Patient profile:   55 year old female Weight:      228 pounds Pulse rate:   113 / minute BP sitting:   154 / 98  (left arm)  Vitals Entered By: Doristine Devoid CMA (June 26, 2010 2:28 PM) CC: BP ELEVATED and HA   History of Present Illness: 56 yo woman here today for  1) HTN- BP has been as high as 160/100.  now taking Clonidine 'religiously'.  denies AM HAs, they start later in the day.  c/o fatigue due to the clonidine.  'i'm so sleepy'.  pt reports 'i felt best on Lisinopril but i was coughing'.  no CP, SOB, visual changes, edema.  2) HAs- no N/V w/ HA.  notes HA w/ increased BP.  HA encompasses the whole head.  will have increased ringing in the ear w/ HA and elevated BP.  no photo or phonophobia.  no dizziness. no focal weakness.  Current Medications (verified): 1)  Cymbalta 30 Mg  Cpep (Duloxetine Hcl) .... Take Three Times A Day 2)  Wellbutrin Xl 150 Mg Xr24h-Tab (Bupropion Hcl) .Marland Kitchen.. 1 By Mouth Once Daily 3)  Ambien 10 Mg  Tabs (Zolpidem Tartrate) .... Prn 4)  Adderall Xr 10 Mg Xr24h-Cap (Amphetamine-Dextroamphetamine) .... Take One Tablet Daily. 5)  Vitamin D (Ergocalciferol) 50000 Unit Caps (Ergocalciferol) .... Take 1 Tab Weekly 6)  Fexofenadine Hcl 180 Mg Tabs (Fexofenadine Hcl) .Marland Kitchen.. 1 Once Daily 7)  Knee Sleeve R Knee .... Dx Knee Pain 8)  Losartan Potassium 50 Mg Tabs (Losartan Potassium) .Marland Kitchen.. 1 Tab By Mouth Daily  Allergies (verified): 1)  ! Lisinopril (Lisinopril) 2)  ! Metoprolol Tartrate (Metoprolol Tartrate) 3)  ! Amlodipine Besylate (Amlodipine Besylate)  Past History:  Past Medical History: Last updated: 10/02/2008 Depression Allergic rhinitis Hypertension  Social History: Last updated: 10/02/2008 Occupation:teacher Never Smoked Alcohol use-yes Drug use-no Regular exercise-yes  Review of Systems      See HPI  Physical Exam  General:  Well-developed,well-nourished,in no  acute distress; alert,appropriate and cooperative throughout examination Head:  Normocephalic and atraumatic without obvious abnormalities. No apparent alopecia or balding. Eyes:  vision grossly intact, pupils equal, pupils round, pupils reactive to light, and no injection.   Neck:  No deformities, masses, or tenderness noted. Lungs:  Normal respiratory effort, chest expands symmetrically. Lungs are clear to auscultation, no crackles or wheezes. Heart:  normal rate and no murmur.   Pulses:  +2 carotid, radial, DP Extremities:  no C/C/E Neurologic:  alert & oriented X3, cranial nerves II-XII intact, strength normal in all extremities, gait normal, and DTRs symmetrical and normal.     Impression & Recommendations:  Problem # 1:  HYPERTENSION (ICD-401.9) Assessment Unchanged BP again elevated.  clonidine not working and pt c/o side effects.  switch to ARB as pt felt 'good' on ACE but had cough.  will follow. The following medications were removed from the medication list:    Clonidine Hcl 0.1 Mg Tabs (Clonidine hcl) .Marland Kitchen... 1 two times a day Her updated medication list for this problem includes:    Losartan Potassium 50 Mg Tabs (Losartan potassium) .Marland Kitchen... 1 tab by mouth daily  Orders: Venipuncture (57846) TLB-BMP (Basic Metabolic Panel-BMET) (80048-METABOL)  Problem # 2:  HEADACHE (ICD-784.0) Assessment: Unchanged likely related to elevated BP.  pt feels this 'has to be the reason'.  neuro exam WNL.  no red flags on hx or PE.  will follow.  Complete  Medication List: 1)  Cymbalta 30 Mg Cpep (Duloxetine hcl) .... Take three times a day 2)  Wellbutrin Xl 150 Mg Xr24h-tab (Bupropion hcl) .Marland Kitchen.. 1 by mouth once daily 3)  Ambien 10 Mg Tabs (Zolpidem tartrate) .... Prn 4)  Adderall Xr 10 Mg Xr24h-cap (Amphetamine-dextroamphetamine) .... Take one tablet daily. 5)  Vitamin D (ergocalciferol) 50000 Unit Caps (Ergocalciferol) .... Take 1 tab weekly 6)  Fexofenadine Hcl 180 Mg Tabs (Fexofenadine hcl)  .Marland Kitchen.. 1 once daily 7)  Knee Sleeve R Knee  .... Dx knee pain 8)  Losartan Potassium 50 Mg Tabs (Losartan potassium) .Marland Kitchen.. 1 tab by mouth daily  Patient Instructions: 1)  Schedule a follow up with Dr Laury Axon in 1-2 weeks to recheck BP 2)  Start the Losartan (Cozaar) daily 3)  Drink plenty of fluids 4)  Tylenol/Ibuprofen as needed for headache 5)  Call with any questions or concerns 6)  Hang in there! Prescriptions: LOSARTAN POTASSIUM 50 MG TABS (LOSARTAN POTASSIUM) 1 tab by mouth daily  #30 x 3   Entered and Authorized by:   Neena Rhymes MD   Signed by:   Neena Rhymes MD on 06/26/2010   Method used:   Electronically to        [facility] #[id number]* (retail)       [address]       [place], [place]  [address]       Ph: [phone]       Fax: [phone]   RxID:   [id number]

## 2011-01-14 NOTE — Letter (Signed)
Summary: [facility] Instructions  [facility]  [address] [place], [place] [address]   Phone: [phone]  Fax: [phone]       Diana Vincent    [birth year]    MRN: [id number]        Procedure Day /Date:  07/31/10  Wednesday     Arrival Time:  9:30am         Procedure Time:  10:30am     Location of Procedure:                    _x _  [facility] (4th Floor)   PREPARATION FOR COLONOSCOPY WITH MOVIPREP   Starting 5 days prior to your procedure _8/12/11 _ do not eat nuts, seeds, popcorn, corn, beans, peas,  salads, or any raw vegetables.  Do not take any fiber supplements (e.g. Metamucil, Citrucel, and Benefiber).  THE DAY BEFORE YOUR PROCEDURE         DATE:   07/30/10   DAY:  Tuesday  1.  Drink clear liquids the entire day-NO SOLID FOOD  2.  Do not drink anything colored red or purple.  Avoid juices with pulp.  No orange juice.  3.  Drink at least 64 oz. (8 glasses) of fluid/clear liquids during the day to prevent dehydration and help the prep work efficiently.  CLEAR LIQUIDS INCLUDE: Water Jello Ice Popsicles Tea (sugar ok, no milk/cream) Powdered fruit flavored drinks Coffee (sugar ok, no milk/cream) Gatorade Juice: apple, white grape, white cranberry  Lemonade Clear bullion, consomm, broth Carbonated beverages (any kind) Strained chicken noodle soup Hard Candy                             4.  In the morning, mix first dose of MoviPrep solution:    Empty 1 Pouch A and 1 Pouch B into the disposable container    Add lukewarm drinking water to the top line of the container. Mix to dissolve    Refrigerate (mixed solution should be used within 24 hrs)  5.  Begin drinking the prep at 5:00 p.m. The MoviPrep container is divided by 4 marks.   Every 15 minutes drink the solution down to the next mark (approximately 8 oz) until the full liter is complete.   6.  Follow completed prep with 16 oz of clear liquid of your choice (Nothing red or  purple).  Continue to drink clear liquids until bedtime.  7.  Before going to bed, mix second dose of MoviPrep solution:    Empty 1 Pouch A and 1 Pouch B into the disposable container    Add lukewarm drinking water to the top line of the container. Mix to dissolve    Refrigerate  THE DAY OF YOUR PROCEDURE      DATE:   07/31/10  DAY:   Wednesday  Beginning at  5:30 a.m. (5 hours before procedure):         1. Every 15 minutes, drink the solution down to the next mark (approx 8 oz) until the full liter is complete.  2. Follow completed prep with 16 oz. of clear liquid of your choice.    3. You may drink clear liquids until   8:30am  (2 HOURS BEFORE PROCEDURE).   MEDICATION INSTRUCTIONS  Unless otherwise instructed, you should take regular prescription medications with a small sip of water   as early as possible the morning of your procedure.  Additional medication instructions: Do not take HCTZ am of procedure.         OTHER INSTRUCTIONS  You will need a responsible adult at least 54 years of age to accompany you and drive you home.   This person must remain in the waiting room during your procedure.  Wear loose fitting clothing that is easily removed.  Leave jewelry and other valuables at home.  However, you may wish to bring a book to read or  an iPod/MP3 player to listen to music as you wait for your procedure to start.  Remove all body piercing jewelry and leave at home.  Total time from sign-in until discharge is approximately 2-3 hours.  You should go home directly after your procedure and rest.  You can resume normal activities the  day after your procedure.  The day of your procedure you should not:   Drive   Make legal decisions   Operate machinery   Drink alcohol   Return to work  You will receive specific instructions about eating, activities and medications before you leave.    The above instructions have been reviewed and explained to me by    Ulis Rias RN  July 30, 2010 7:27 AM     I fully understand and can verbalize these instructions _____________________________ Date _________

## 2011-01-14 NOTE — Progress Notes (Signed)
Summary: losartan refill   Phone Note Refill Request Call back at [phone] Message from:  Pharmacy on August 12, 2010 8:32 AM  Refills Requested: Medication #1:  LOSARTAN POTASSIUM 100 MG TABS take one tablet daily   Dosage confirmed as above?Dosage Confirmed   Supply Requested: 1 month   Last Refilled: 06/26/2010   Notes: Patient states she should be on 100 mg. Is this correct? [facility]  Next Appointment Scheduled: none Initial call taken by: Lavell Islam,  August 12, 2010 8:33 AM    Prescriptions: LOSARTAN POTASSIUM 100 MG TABS (LOSARTAN POTASSIUM) take one tablet daily  #30 x 6   Entered by:   Doristine Devoid CMA   Authorized by:   Neena Rhymes MD   Signed by:   Doristine Devoid CMA on 08/12/2010   Method used:   Electronically to        [facility] #[id number]* (retail)       [address]       [place], [place]  [address]       Ph: [phone]       Fax: [phone]   RxID:   [id number]

## 2011-01-14 NOTE — Progress Notes (Signed)
Summary: congested ref ov  Phone Note Call from Patient Call back at Home Phone [phone]   Caller: Patient Summary of Call: patient is congested - cough / headache ear pressure - wanted to know if med could be called - [facility] - i her [facility] is [facility] - she doesnt want to leave him  Initial call taken by: Okey Regal [place],  April 12, 2010 10:52 AM  Follow-up for Phone Call        left message to call office................Marland KitchenFelecia Deloach CMA  April 12, 2010 11:26 AM  pt return call, advise pt that OV will be needed in order for med to be rx. Offer appt pt decline stating that she does not want to leave husband. Advise pt to explain symptoms so that i can possible suggest some OTC meds to see if she can get some relief. Pt refused stating that she did not want to try anything OTC and will tough it out. Pt thank me for trying.................Marland KitchenFelecia Deloach CMA  April 12, 2010 11:54 AM

## 2011-01-14 NOTE — Miscellaneous (Signed)
Summary: Lec previsit  Clinical Lists Changes  Medications: Added new medication of MOVIPREP 100 GM  SOLR (PEG-KCL-NACL-NASULF-NA ASC-C) As per prep instructions. - Signed Rx of MOVIPREP 100 GM  SOLR (PEG-KCL-NACL-NASULF-NA ASC-C) As per prep instructions.;  #1 x 0;  Signed;  Entered by: Ulis Rias RN;  Authorized by: Mardella Layman MD [facility];  Method used: Electronically to [facility] #[id number]*, [address] [place], [place], [place]  [address], Ph: [phone], Fax: [phone] Observations: Added new observation of ALLERGY REV: Done (07/23/2010 7:55)    Prescriptions: MOVIPREP 100 GM  SOLR (PEG-KCL-NACL-NASULF-NA ASC-C) As per prep instructions.  #1 x 0   Entered by:   Ulis Rias RN   Authorized by:   Mardella Layman MD [facility]   Signed by:   Ulis Rias RN on 07/23/2010   Method used:   Electronically to        [facility] #[id number]* (retail)       [address]       [place], [place]  [address]       Ph: [phone]       Fax: [phone]   RxID:   [id number]

## 2011-01-14 NOTE — Assessment & Plan Note (Signed)
Summary: bp check//lch   Vital Signs:  Patient profile:   55 year old female Weight:      224 pounds Pulse rate:   86 / minute Pulse rhythm:   regular BP sitting:   124 / 84  (left arm) Cuff size:   large  Vitals Entered By: Army Fossa CMA (June 04, 2010 1:38 PM) CC: Pt here for BP follow up, Headaches   History of Present Illness:  Headaches      This is a 55 year old woman who presents with Headaches.  Pt states headaches started shortly after starting clonidine. She admits to not taking it regularly--she forgets to take the 2nd dose.  The patient denies nausea, vomiting, sweats, tearing of eyes, nasal congestion, sinus pain, sinus pressure, photophobia, and phonophobia.  The headache is described as intermittent and band-like.  The location of the pain is bitemporal.  The patient denies the following high-risk features: fever, neck pain/stiffness, vision loss or change, focal weakness, altered mental status, rash, trauma, pain worse with exertion, new type of headache, age >50 years, immunosuppression, concomitant infection, and anticoagulation use.    Hypertension follow-up      The patient also presents for Hypertension follow-up.  The patient complains of headaches.  The patient denies the following associated symptoms: chest pain, chest pressure, exercise intolerance, dyspnea, palpitations, syncope, leg edema, and pedal edema.  Compliance with medications (by patient report) has been near 100%.  The patient reports that dietary compliance has been fair.  The patient reports exercising 3-4X per week.  Adjunctive measures currently used by the patient include salt restriction.    R knee  Pt c/o right knee pain for 6-8 months.  No known injury.  Pain is worse going up stairs.          Current Medications (verified): 1)  Cymbalta 30 Mg  Cpep (Duloxetine Hcl) .... Take Three Times A Day 2)  Wellbutrin Xl 150 Mg Xr24h-Tab (Bupropion Hcl) .Marland Kitchen.. 1 By Mouth Once Daily 3)  Ambien 10  Mg  Tabs (Zolpidem Tartrate) .... Prn 4)  Adderall Xr 10 Mg Xr24h-Cap (Amphetamine-Dextroamphetamine) .... Take One Tablet Daily. 5)  Vitamin D (Ergocalciferol) 50000 Unit Caps (Ergocalciferol) .... Take 1 Tab Weekly 6)  Clonidine Hcl 0.1 Mg Tabs (Clonidine Hcl) .Marland Kitchen.. 1 Two Times A Day 7)  Fexofenadine Hcl 180 Mg Tabs (Fexofenadine Hcl) .Marland Kitchen.. 1 Once Daily 8)  Knee Sleeve R Knee .... Dx Knee Pain  Allergies: 1)  ! Lisinopril (Lisinopril) 2)  ! Metoprolol Tartrate (Metoprolol Tartrate) 3)  ! Amlodipine Besylate (Amlodipine Besylate)  Past History:  Past medical, surgical, family and social histories (including risk factors) reviewed for relevance to current acute and chronic problems.  Past Medical History: Reviewed history from 10/02/2008 and no changes required. Depression Allergic rhinitis Hypertension  Family History: Reviewed history from 10/02/2008 and no changes required. M-- CLL Family History Hypertension  Social History: Reviewed history from 10/02/2008 and no changes required. 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 Lungs are clear to auscultation, no crackles or wheezes. Heart:  normal rate and no murmur.   Msk:  R knee ---normal ROM, no joint tenderness, no joint swelling, no joint warmth, and no redness over joints.  pain only with weight bearing Extremities:  No clubbing, cyanosis,  edema, or deformity noted with normal full range of motion of all joints.   Psych:  Oriented X3 and normally interactive.     Impression & Recommendations:  Problem # 1:  HYPERTENSION (ICD-401.9)  Her updated medication list for this problem includes:    Clonidine Hcl 0.1 Mg Tabs (Clonidine hcl) .Marland Kitchen... 1 two  times a day  BP today: 124/84 Prior BP: 122/80 (02/06/2010)  Labs Reviewed: K+: 3.7 (10/05/2009) Creat: : 0.7 (10/05/2009)   Chol: 103 (10/05/2009)   HDL: 43.30 (10/05/2009)   LDL: 37 (10/05/2009)   TG: 113.0 (10/05/2009)  Problem # 2:  HEADACHE (ICD-784.0) ? if secondary to pt not taking bp med consistently wanted to chang pt to bystolic but she is reluctant to change because her insurance will only pay for generics---pt will double check with ins and let us know  Problem # 3:  KNEE PAIN, RIGHT (ICD-719.46) knee sleeve Discussed strengthening exercises, use of ice or heat, and medications.   Complete Medication List: 1)  Cymbalta 30 Mg Cpep (Duloxetine hcl) .... Take three times a day 2)  Wellbutrin Xl 150 Mg Xr24h-tab (Bupropion hcl) .Marland Kitchen.. 1 by mouth once daily 3)  Ambien 10 Mg Tabs (Zolpidem tartrate) .... Prn 4)  Adderall Xr 10 Mg Xr24h-cap (Amphetamine-dextroamphetamine) .... Take one tablet daily. 5)  Vitamin D (ergocalciferol) 50000 Unit Caps (Ergocalciferol) .... Take 1 tab weekly 6)  Clonidine Hcl 0.1 Mg Tabs (Clonidine hcl) .Marland Kitchen.. 1 two times a day 7)  Fexofenadine Hcl 180 Mg Tabs (Fexofenadine hcl) .Marland Kitchen.. 1 once daily 8)  Knee Sleeve R Knee  .... Dx knee pain  Patient Instructions: 1)  check to see where bystolic is on your formulary--we may be able to change your bp med to that 2)  Please schedule a follow-up appointment in 6 months-- pt due for physical in October Prescriptions: KNEE SLEEVE R KNEE DX knee pain  #1 x 0   Entered and Authorized by:   Loreen Freud DO   Signed by:   Loreen Freud DO on 06/04/2010   Method used:   Print then Give to Patient   RxID:   [id number] CLONIDINE HCL 0.1 MG TABS (CLONIDINE HCL) 1 two times a day  #60 Each x 5   Entered and Authorized by:   Loreen Freud DO   Signed by:   Loreen Freud DO on 06/04/2010   Method used:   Electronically to        [facility] #[id number]* (retail)       [address]        [place], [place]  [address]       Ph: [phone]       Fax: [phone]   RxID:   [id number]

## 2011-01-14 NOTE — Progress Notes (Signed)
Summary: Prior Auth APPROVED FEXOFENADINE [employer]  Phone Note Refill Request Message from:  Pharmacy on [facility] on [address] Fax #: [phone]  Refills Requested: Medication #1:  FEXOFENADINE HCL 180 MG TABS 1 once daily.   Dosage confirmed as above?Dosage Confirmed   Supply Requested: 1 month   Last Refilled: 02/06/2010 Plan does not cover this medication. Please call plan at [phone] to initiate prior auth. Dennie Bible ID # is [id number].  Initial call taken by: Harold Barban,  February 07, 2010 9:28 AM  Follow-up for Phone Call        prior auth in process awaiting fax............Marland KitchenFelecia Deloach CMA  February 07, 2010 10:25 AM  prior auth faxed back awaiting response.........Marland KitchenFelecia Deloach CMA  February 07, 2010 10:54 AM   Additional Follow-up for Phone Call Additional follow up Details #1::        prior auth approved 01-17-10 until 02-07-11, pharmacy faxed, approval letter scan to chart................Marland KitchenFelecia Deloach CMA  February 08, 2010 8:29 AM

## 2011-01-14 NOTE — Assessment & Plan Note (Signed)
Summary: COUGHING ALL NIGHT, HEADACHE--BP MEDS CAUSING PROB??///SPH   Vital Signs:  Patient profile:   55 year old female Height:      67 inches Weight:      226 pounds BMI:     35.52 Temp:     97.7 degrees F oral BP sitting:   122 / 84  (left arm)  Vitals Entered By: Doristine Devoid CMA (September 16, 2010 2:42 PM) CC: cough w/ PND along having HA and pressure    History of Present Illness: 55 yo woman here today for cough.  having excessive PND- waking pt at night.  despite PND cough is nonproductive.  + hoarseness.  + facial pain and pressure over frontal sinuses.  + HA.  intermittant ear pressure.  taking Allegra regularly.  Current Medications (verified): 1)  Cymbalta 30 Mg  Cpep (Duloxetine Hcl) .... Take Three Times A Day 2)  Wellbutrin Xl 150 Mg Xr24h-Tab (Bupropion Hcl) .Marland Kitchen.. 1 By Mouth Once Daily 3)  Ambien 10 Mg  Tabs (Zolpidem Tartrate) .... Prn 4)  Vitamin D (Ergocalciferol) 50000 Unit Caps (Ergocalciferol) .... Take 1 Tab Weekly 5)  Fexofenadine Hcl 180 Mg Tabs (Fexofenadine Hcl) .Marland Kitchen.. 1 Once Daily 6)  Knee Sleeve R Knee .... Dx Knee Pain 7)  Losartan Potassium 100 Mg Tabs (Losartan Potassium) .... Take One Tablet Daily 8)  Hydrochlorothiazide 12.5 Mg  Tabs (Hydrochlorothiazide) .... Take 1 Tab  By Mouth Every Morning  Allergies (verified): 1)  ! Lisinopril (Lisinopril) 2)  ! Metoprolol Tartrate (Metoprolol Tartrate) 3)  ! Amlodipine Besylate (Amlodipine Besylate)  Past History:  Past Medical History: Last updated: 10/02/2008 Depression Allergic rhinitis Hypertension  Review of Systems      See HPI  Physical Exam  General:  Well-developed,well-nourished,in no acute distress; alert,appropriate and cooperative throughout examination Head:  NCAT, + TTP over frontal and maxillary sinuses Eyes:  no injxn or inflammation Ears:  External ear exam shows no significant lesions or deformities.  Otoscopic examination reveals clear canals, tympanic membranes are intact  bilaterally without bulging, retraction, inflammation or discharge. Hearing is grossly normal bilaterally. Nose:  + congestion and turbinate edema Mouth:  + PND Neck:  No deformities, masses, or tenderness noted. Lungs:  Normal respiratory effort, chest expands symmetrically. Lungs are clear to auscultation, no crackles or wheezes. Heart:  normal rate and no murmur.     Impression & Recommendations:  Problem # 1:  ACUTE SINUSITIS, UNSPECIFIED (ICD-461.9) Assessment Unchanged pt's hx and PE consistent w/ sinus infxn.  given turbinates and PND needs to resume nasal steroid spray during peak allergy season.  (has nasonex from ENT).  start amox.  reviewed supportive care and red flags that should prompt return.  Pt expresses understanding and is in agreement w/ this plan. Her updated medication list for this problem includes:    Amoxicillin 500 Mg Tabs (Amoxicillin) .Marland Kitchen... 2 tabs by mouth two times a day x10 days  Complete Medication List: 1)  Cymbalta 30 Mg Cpep (Duloxetine hcl) .... Take three times a day 2)  Wellbutrin Xl 150 Mg Xr24h-tab (Bupropion hcl) .Marland Kitchen.. 1 by mouth once daily 3)  Ambien 10 Mg Tabs (Zolpidem tartrate) .... Prn 4)  Vitamin D (ergocalciferol) 50000 Unit Caps (Ergocalciferol) .... Take 1 tab weekly 5)  Fexofenadine Hcl 180 Mg Tabs (Fexofenadine hcl) .Marland Kitchen.. 1 once daily 6)  Knee Sleeve R Knee  .... Dx knee pain 7)  Losartan Potassium 100 Mg Tabs (Losartan potassium) .... Take one tablet daily 8)  Hydrochlorothiazide 12.5  Mg Tabs (Hydrochlorothiazide) .... Take 1 tab  by mouth every morning 9)  Amoxicillin 500 Mg Tabs (Amoxicillin) .... 2 tabs by mouth two times a day x10 days  Patient Instructions: 1)  Follow up as previously scheduled 2)  Continue the Allegra- add the nasonex for the allergy component 3)  Take the Amoxicillin as directed for the sinus infection- take w/ food to avoid upset stomach 4)  Drink plenty of fluids 5)  Call with any questions or concerns 6)   Hang in there!!! Prescriptions: AMOXICILLIN 500 MG TABS (AMOXICILLIN) 2 tabs by mouth two times a day x10 days  #40 x 0   Entered and Authorized by:   Neena Rhymes MD   Signed by:   Neena Rhymes MD on 09/16/2010   Method used:   Electronically to        [facility] [address] #[id number]* (retail)       [address]       [place], [place]  [address]       Ph: [phone]       Fax: [phone]   RxID:   [id number]

## 2011-01-14 NOTE — Medication Information (Signed)
Summary: Prior Authorization & Approval for Fexofenadine/[employer]  Prior Authorization & Approval for Fexofenadine/[employer]   Imported By: Lanelle Bal 02/13/2010 13:08:20  _____________________________________________________________________  External Attachment:    Type:   Image     Comment:   External Document

## 2011-01-22 ENCOUNTER — Encounter: Payer: Self-pay | Admitting: Family Medicine

## 2011-01-22 ENCOUNTER — Other Ambulatory Visit: Payer: Self-pay | Admitting: Family Medicine

## 2011-01-22 ENCOUNTER — Ambulatory Visit (INDEPENDENT_AMBULATORY_CARE_PROVIDER_SITE_OTHER): Payer: BC Managed Care – PPO | Admitting: Family Medicine

## 2011-01-22 DIAGNOSIS — M2550 Pain in unspecified joint: Secondary | ICD-10-CM

## 2011-01-22 DIAGNOSIS — R5381 Other malaise: Secondary | ICD-10-CM

## 2011-01-22 DIAGNOSIS — R5383 Other fatigue: Secondary | ICD-10-CM

## 2011-01-22 DIAGNOSIS — F329 Major depressive disorder, single episode, unspecified: Secondary | ICD-10-CM

## 2011-01-22 DIAGNOSIS — G44209 Tension-type headache, unspecified, not intractable: Secondary | ICD-10-CM

## 2011-01-23 LAB — CBC WITH DIFFERENTIAL/PLATELET
Eosinophils Absolute: 0.1 10*3/uL (ref 0.0–0.7)
Lymphs Abs: 1.5 10*3/uL (ref 0.7–4.0)
Monocytes Relative: 6.3 % (ref 3.0–12.0)
Neutro Abs: 6.6 10*3/uL (ref 1.4–7.7)
Neutrophils Relative %: 75.3 % (ref 43.0–77.0)

## 2011-01-23 LAB — URIC ACID: Uric Acid, Serum: 4.6 mg/dL (ref 2.4–7.0)

## 2011-01-23 LAB — SEDIMENTATION RATE: Sed Rate: 17 mm/hr (ref 0–22)

## 2011-01-23 LAB — CONVERTED CEMR LAB
Anti Nuclear Antibody(ANA): NEGATIVE
Rhuematoid fact SerPl-aCnc: <10 intl units/mL (ref <=14)

## 2011-01-30 NOTE — Assessment & Plan Note (Signed)
Summary: headaches  (bp meds?)///sph  Medications Added ROBAXIN-750 750 MG TABS (METHOCARBAMOL) 1-2 tabs three times a day as needed for pain/spasm      Allergies Added:  Nurse Visit   Vital Signs:  Patient profile:   55 year old female Weight:      231 pounds BMI:     36.31 Pulse rate:   105 / minute BP sitting:   126 / 88  (left arm)  Vitals Entered By: Doristine Devoid CMA (January 22, 2011 3:33 PM) CC: HA daily x1 wk    History of Present Illness: 55 yo woman here today for daily HA.  sxs started last week.  HAs will start  ~2-3pm and last until bed.  having nasal congestion.  using Allegra daily.  no N/V associated w/ HAs.  no photo or phonophobia.  HAs will start in neck and creep up into head, eventually encircling head.  BP has been well controlled.  admits to increased stress- husband has been ill.  doesn't feel depression is well controlled.  has been in therapy before but not currently.  on both cymbalta and wellbutrin.  increased fatigue- will get home from work and fall asleep.  ? gout- will have intermittant pain in R big toe.  has nodules in DIP joints of fingers and on L medial surface of wrist.  nodules will be intermittantly painful.  unable to sleep on either side unless on tempurpedic mattress due to hip pain.  pain in fingers is worsening, nodules are progressive.   Current Medications (verified): 1)  Cymbalta 30 Mg  Cpep (Duloxetine Hcl) .... Take Three Times A Day 2)  Wellbutrin Xl 150 Mg Xr24h-Tab (Bupropion Hcl) .Marland Kitchen.. 1 By Mouth Once Daily 3)  Ambien 10 Mg  Tabs (Zolpidem Tartrate) .... Prn 4)  Fexofenadine Hcl 180 Mg Tabs (Fexofenadine Hcl) .Marland Kitchen.. 1 Once Daily 5)  Knee Sleeve R Knee .... Dx Knee Pain 6)  Losartan Potassium 100 Mg Tabs (Losartan Potassium) .... Take One Tablet Daily 7)  Hydrochlorothiazide 12.5 Mg  Tabs (Hydrochlorothiazide) .... Take 1 Tab  By Mouth Every Morning  Allergies (verified): 1)  ! Lisinopril (Lisinopril) 2)  ! Metoprolol  Tartrate (Metoprolol Tartrate) 3)  ! Amlodipine Besylate (Amlodipine Besylate)  Past History:  Past medical, surgical, family and social histories (including risk factors) reviewed for relevance to current acute and chronic problems.  Past Medical History: Reviewed history from 10/02/2008 and no changes required. Depression Allergic rhinitis Hypertension   Review of Systems      See HPI   Physical Exam  General:  Well-developed,well-nourished,in no acute distress; alert,appropriate and cooperative throughout examination Head:  NCAT Neck:  No deformities, masses, or tenderness noted. Lungs:  Normal respiratory effort, chest expands symmetrically. Lungs are clear to auscultation, no crackles or wheezes. Heart:  normal rate and no murmur.   Msk:  + trap spasm bilaterally  no joint warmth, no redness over joints, joint tenderness, joint swelling, enlarged DIP joints, and ulnar deviation of fingers.   Pulses:  +2 carotid, radial, DP Extremities:  nodules on DIP joints bilaterally, nodule on L wrist Neurologic:  No cranial nerve deficits noted. Station and gait are normal. Plantar reflexes are down-going bilaterally. DTRs are symmetrical throughout. Sensory, motor and coordinative functions appear intact. Psych:  Oriented X3, memory intact for recent and remote, normally interactive, good eye contact, not anxious appearing, and not depressed appearing.      Impression & Recommendations:  Problem # 1:  HEADACHE, TENSION (ICD-307.81)  Assessment New pt's HA is classic for tension.  normal neuro exam.  reviewed importance of stress relief, massage, good posture.  heat, muscle relaxers, and NSAIDs as needed.  reviewed supportive care and red flags that should prompt return.  Pt expresses understanding and is in agreement w/ this plan.  Problem # 2:  PAIN IN JOINT, MULTIPLE SITES (ICD-719.49) Assessment: Deteriorated pt w/ ? rheumatoid nodules.  given progressive nature of deformity  and worsening of sxs will refer to rheumatology.  check labs. Orders: Venipuncture ([address]) TLB-Uric Acid, Blood (84550-URIC) TLB-Sedimentation Rate (ESR) (85652-ESR) T-Antinuclear Antib (ANA) (60454-09811) T-Rheumatoid Factor (91478-29562) TLB-CBC Platelet - w/Differential (85025-CBCD) Specimen Handling (13086) Rheumatology Referral (Rheumatology)  Problem # 3:  FATIGUE (ICD-780.79) Assessment: New most likely related to poorly controlled depression.  check TSH to r/o thyroid involvement. Orders: TLB-TSH (Thyroid Stimulating Hormone) (84443-TSH) Specimen Handling (57846)  Problem # 4:  DEPRESSION (ICD-311) Assessment: Deteriorated pt's depression is poorly controlled.  seeing psych for meds.  encouraged her to discuss this w/ them.  encouraged counseling. Her updated medication list for this problem includes:    Cymbalta 30 Mg Cpep (Duloxetine hcl) .Marland Kitchen... Take three times a day    Wellbutrin Xl 150 Mg Xr24h-tab (Bupropion hcl) .Marland Kitchen... 1 by mouth once daily  Complete Medication List: 1)  Cymbalta 30 Mg Cpep (Duloxetine hcl) .... Take three times a day 2)  Wellbutrin Xl 150 Mg Xr24h-tab (Bupropion hcl) .Marland Kitchen.. 1 by mouth once daily 3)  Ambien 10 Mg Tabs (Zolpidem tartrate) .... Prn 4)  Fexofenadine Hcl 180 Mg Tabs (Fexofenadine hcl) .Marland Kitchen.. 1 once daily 5)  Knee Sleeve R Knee  .... Dx knee pain 6)  Losartan Potassium 100 Mg Tabs (Losartan potassium) .... Take one tablet daily 7)  Hydrochlorothiazide 12.5 Mg Tabs (Hydrochlorothiazide) .... Take 1 tab  by mouth every morning 8)  Robaxin-750 750 Mg Tabs (Methocarbamol) .Marland Kitchen.. 1-2 tabs three times a day as needed for pain/spasm   Patient Instructions: 1)  Please schedule your complete physical at your convenience- do not eat before this appt 2)  We will notify you of your lab results 3)  Your headaches are consistent w/ tension headaches 4)  Take the Robaxin as needed for muscle spasm 5)  Use Excedrin Migraine/Tension Headache or Aleve early  in the course of the headache 6)  Use heat or ice to relieve muscle spasm 7)  Try and get a massage 8)  We'll refer you to rheumatology for further work up of your joint pains/deformity 9)  Consider counseling as a stress outlet 10)  Hang in there!   Orders Added: 1)  Venipuncture [36415] 2)  TLB-Uric Acid, Blood [84550-URIC] 3)  TLB-Sedimentation Rate (ESR) [85652-ESR] 4)  T-Antinuclear Antib (ANA) [96295-28413] 5)  T-Rheumatoid Factor [24401-02725] 6)  TLB-CBC Platelet - w/Differential [85025-CBCD] 7)  TLB-TSH (Thyroid Stimulating Hormone) [84443-TSH] 8)  Specimen Handling [99000] 9)  Rheumatology Referral [Rheumatology] 10)  Est. Patient Level IV [36644] Prescriptions: ROBAXIN-750 750 MG TABS (METHOCARBAMOL) 1-2 tabs three times a day as needed for pain/spasm  #45 x 1   Entered and Authorized by:   Neena Rhymes MD   Signed by:   Neena Rhymes MD on 01/22/2011   Method used:   Electronically to        [facility] [address] [phone]* (retail)       [address]       [place], [place]  [address]  Ph: [phone]       Fax: [phone]   RxID:   [id number]

## 2011-02-05 ENCOUNTER — Encounter: Payer: Self-pay | Admitting: Family Medicine

## 2011-02-11 ENCOUNTER — Telehealth: Payer: Self-pay | Admitting: Family Medicine

## 2011-02-20 NOTE — Progress Notes (Signed)
Summary: reaction to med  Phone Note Refill Request Call back at Home Phone [phone] Message from:  Patient  Refills Requested: Medication #1:  HYDROCHLOROTHIAZIDE 12.5 MG  TABS Take 1 tab  by mouth every morning Pt states that med is causing her to be very fatigue, sleepy, headache, and hair loss. Pt would like to know if med can be decreased, Pt notes that when she was on losartan she did not experience any of these symptoms. Pls advise...........Marland KitchenFelecia Deloach CMA  February 11, 2011 12:18 PM   Call after 2:30 pm today.Marti Sleigh Deloach CMA  February 11, 2011 12:19 PM    Follow-up for Phone Call        pt has been on this med since July- is this the 1st she's experiencing these problems?  i saw her recently and she didn't mention any of these concerns.  if she would like to stop the HCTZ she can but she will need a visit in the next 2 weeks to check BP and make sure that it is controlled. Follow-up by: Neena Rhymes MD,  February 11, 2011 12:42 PM  Additional Follow-up for Phone Call Additional follow up Details #1::        Discuss with patient, will D/C med and call back to schedule f/u appt for BP check.Marti Sleigh Deloach CMA  February 11, 2011 12:58 PM

## 2011-02-25 NOTE — Consult Note (Signed)
Summary: [facility]   Imported By: Maryln Gottron 02/18/2011 12:41:08  _____________________________________________________________________  External Attachment:    Type:   Image     Comment:   External Document

## 2011-03-10 ENCOUNTER — Other Ambulatory Visit: Payer: Self-pay | Admitting: Family Medicine

## 2011-03-10 MED ORDER — LOSARTAN POTASSIUM 100 MG PO TABS: 100.0000 mg | ORAL_TABLET | Freq: Every day | ORAL | Status: DC

## 2011-03-10 NOTE — Telephone Encounter (Signed)
Done

## 2011-03-10 NOTE — Telephone Encounter (Signed)
Last BMP 06/2010. Please advise.

## 2011-03-10 NOTE — Telephone Encounter (Signed)
Ok for #30, no refills.  Pt was told in phone note from 2/28 that she needed to schedule f/u visit to check BP in 2 weeks.  Must schedule BP f/u.

## 2011-03-11 ENCOUNTER — Encounter: Payer: Self-pay | Admitting: Family Medicine

## 2011-03-13 ENCOUNTER — Ambulatory Visit (INDEPENDENT_AMBULATORY_CARE_PROVIDER_SITE_OTHER): Payer: BC Managed Care – PPO | Admitting: Family Medicine

## 2011-03-13 ENCOUNTER — Encounter: Payer: Self-pay | Admitting: Family Medicine

## 2011-03-13 VITALS — BP 140/100 | Temp 97.3°F | Ht 64.0 in | Wt 231.0 lb

## 2011-03-13 DIAGNOSIS — I10 Essential (primary) hypertension: Secondary | ICD-10-CM

## 2011-03-13 DIAGNOSIS — H6690 Otitis media, unspecified, unspecified ear: Secondary | ICD-10-CM

## 2011-03-13 MED ORDER — VALSARTAN 160 MG PO TABS: 160.0000 mg | ORAL_TABLET | Freq: Every day | ORAL | Status: DC

## 2011-03-13 MED ORDER — FLUOCINOLONE ACETONIDE 0.01 % OT OIL: 5.0000 [drp] | TOPICAL_OIL | Freq: Two times a day (BID) | OTIC | Status: DC

## 2011-03-13 MED ORDER — AMOXICILLIN 500 MG PO TABS: 500.0000 mg | ORAL_TABLET | Freq: Two times a day (BID) | ORAL | Status: AC

## 2011-03-13 NOTE — Patient Instructions (Signed)
Follow up in 3-4 weeks to recheck blood pressure STOP the Losartan START the Diovan daily Take the Amoxicillin (w/ food) for the ear infection Use the Dermotic ear drops for the itching and pain in the ear canal Try and limit your salt intake Increase your water intake Hang in there!  We'll get you where you want to be!

## 2011-03-13 NOTE — Progress Notes (Signed)
  Subjective:    Patient ID: Diana Vincent, female    DOB: [birth year], 55 y.o.   MRN: [id number]  HPI HTN- started HCTZ in July and called in Feb reporting fatigue, hairloss and attributed this to HCTZ use.  Stopped med 3 weeks ago.  HTN is chronic problem for pt, not well controlled.  Multiple intolerances to meds.  Hair loss has stopped.  Fatigue has mildly improved.  No CP, SOB, HAs, visual changes.  + edema.  Eating [employer] daily for lunch.  L ear pain- sxs started Saturday after shower.  Has used peroxide w/out results.  Unable to hear out of L ear.  Able to hear heartbeat in ear.  Denies fever.   Review of Systems For ROS see HPI     Objective:   Physical Exam  Constitutional: She appears well-developed and well-nourished. No distress.  HENT:  Head: Normocephalic and atraumatic.  Nose: Nose normal.       R TM normal L TM erythematous, dull w/ visible clear fluid L canal erythematous, outer tender w/ manipulation  Neck: Normal range of motion. Neck supple.  Cardiovascular: Normal rate, regular rhythm and normal heart sounds.   Pulmonary/Chest: Effort normal and breath sounds normal. No respiratory distress. She has no wheezes.  Lymphadenopathy:    She has no cervical adenopathy.          Assessment & Plan:

## 2011-03-13 NOTE — Assessment & Plan Note (Signed)
Pt w/ otitis media and given pain w/ manipulation of outer ear- likely early otitis externa.  Start oral Amox and topical dermotic to tx sxs.  Reviewed supportive care and red flags that should prompt return.  Pt expressed understanding and is in agreement w/ plan.

## 2011-03-13 NOTE — Assessment & Plan Note (Signed)
BP not well controlled.  Switch from losartan to Diovan given pt's multiple med intolerances.  Will follow closely and adjust meds prn.

## 2011-04-25 ENCOUNTER — Ambulatory Visit: Payer: BC Managed Care – PPO | Admitting: Family Medicine

## 2011-04-25 DIAGNOSIS — Z0289 Encounter for other administrative examinations: Secondary | ICD-10-CM

## 2011-04-29 NOTE — Op Note (Signed)
Diana Vincent, Diana Vincent                 ACCOUNT NO.:  [id number]   MEDICAL RECORD NO.:  [id number]          PATIENT TYPE:  AMB   LOCATION:  DAY                          FACILITY:  [facility]   PHYSICIAN:  Alfonse Ras, MD   DATE OF BIRTH:  04-Mar-1956   DATE OF PROCEDURE:  06/28/2007  DATE OF DISCHARGE:                               OPERATIVE REPORT   PREOPERATIVE DIAGNOSIS:  Symptomatic cholelithiasis   POSTOPERATIVE DIAGNOSIS:  Symptomatic cholelithiasis   PROCEDURE:  Laparoscopic cholecystectomy with intraoperative  cholangiogram.   FINDINGS:  Normal cholangiogram.   ANESTHESIA:  General.   SURGEON:  Baruch Merl.   ASSISTANT:  Kendrick Ranch.   DESCRIPTION:  The patient was taken to the operating room and placed in  the supine position.  After adequate general anesthesia was induced  using the endotracheal tube, the abdomen was prepped and draped in a  normal sterile fashion.  Using a transverse infraumbilical incision, I  dissected down to a very small umbilical hernia sac which was dissected  free.  A 0 Vicryl pursestring suture was placed around the fascial  defect.  Peritoneal access was obtained.  Hassan trocar was placed in  the abdomen.  Pneumoperitoneum was obtained.  Under direct vision, an 11-  mm trocar was placed in subxiphoid region, and two 5-mm trocars were  placed in the right abdomen.  The gallbladder was identified and  retracted cephalad.  There were a number of adhesions from the duodenum  to the gallbladder which were taken down with sharp dissection.  There  were a number of omental adhesions that were taken down with blunt  dissection.  The gallbladder was then retracted far cephalad and  laterally.  A tedious dissection of the cystic duct was then undertaken  until a critical view was obtained.  A clip was placed in the proximal  cystic duct, and a small ductotomy was made.  There were few stones that  were released from the cystic duct.  Those were  extracted from the  abdomen.  A Reddick angiocatheter was placed down to the cystic duct,  clipped, and cholangiogram was performed.  This showed free flow into  the duodenum and good filling of both right and left hepatic ducts as  well as an accessory hepatic duct coming off the right hepatic duct.  There were no filling defects.  The catheter was removed.  The cystic  duct was then triply clipped and divided.  The cystic artery was  dissected in similar fashion, triply clipped and divided.  The  gallbladder was taken off the gallbladder bed using Bovie  electrocautery, placed in EndoCatch bag and removed through the  umbilical port.  That fascial defect was  closed with the previously placed 0 Vicryl pursestring suture.  Pneumoperitoneum was released.  Trocars were removed.  Skin incisions  were closed with subcuticular 4-0 Monocryl.  Steri-Strips and sterile  dressings were applied.  The patient tolerated the procedure well and  went to PACU in good condition.      Alfonse Ras, MD  Electronically Signed  [facility]/MEDQ  D:  06/28/2007  T:  06/29/2007  Job:  [id number]

## 2011-05-02 NOTE — Assessment & Plan Note (Signed)
[facility]                        [place] OFFICE NOTE   NAME:Battiste, EVAN OSBURN                        MRN:          [id number]  DATE:04/05/2007                            DOB:          [birth year]    Asherah Lavoy was seen on April 05, 2007, to evaluate epigastric and right  upper quadrant pain.   The initial episode was April 17, and manifested as severe pain, more so  in the right upper quadrant, but also in the epigastrium with radiation  to the back.The pain was present all night and prevented sleep.   She had a second episode on April 19, which was associated with some  shortness of breath and inability to achieve a comfortable position. She  has had some residual soreness in the abdomen since. This has been  associated with nausea, bloating, belching and dyspepsia.   She denies dysphagia or significant weight loss. Stool has been somewhat  looser than usual.   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 Her mother had similar bowel  problems as well as irritable bowel and also had a cholecystectomy.  There is no family history of coronary artery disease.   Mrs. Duarte does not exercise on a regular basis, but when active,  denies any chest pain.   She will have an occasional ibuprofen and an occasional glass of wine.  She will have 2 to 3 cups of coffee daily; she did have chocolate before  both of these episodes.   She used TUMS, Rolaids and Prilosec over-the-counter with no benefit.   Constitutional symptoms are negative except for night sweats.   Her other past history includes:  1. Total abdominal hysterectomy and bilateral salpingo-oophorectomy.  2. Tubal ligation.   She is presently on:  1. Cymbalta 30 mg daily.  2. Wellbutrin XL 300 mg daily.  3. Claritin D daily.  4. Estratest at bedtime.  5. Lorazepam at bedtime.  6. She takes Ambien as  needed.   She has no known drug allergies.   She does not smoke.   Weight is essentially stable. She was afebrile. Pulse 64. Respiratory  rate 16, blood pressure 140/100.  She has no scleral icterus or jaundice.  Skin turgor is normal;oral mucosa reveals no evidence of dehydration.  CHEST: Clear; she has an S4 without significant murmurs or gallops.   She has tenderness particularly in the right upper quadrant, less so in  the epigastrium. There is no chest wall tenderness. Homan's sign is  negative and all pulses are intact.   EKG: Is normal.   Her symptoms are very suggestive of gallbladder disease; differential  would include hiatal hernia with esophageal reflux.   CBC, stool cards, amylase, lipase, H. pylori will all be collected. She  will be placed on Nexium before breakfast and evening meals. She will be  asked to avoid the triggers of coffee, ibuprofen, wine or chocolate  pending further evaluation. Ultrasound of the gallbladder will be  completed with subsequent referral to gastroenterologist or general  surgeon based on  those findings.     Titus Dubin. Alwyn Ren, MD,FACP,FCCP  Electronically Signed    [facility]  DD: 04/05/2007  DT: 04/05/2007  Job #: [id number]

## 2011-05-02 NOTE — Assessment & Plan Note (Signed)
[facility]                                 ON-CALL NOTE   NAME:Vincent, Diana                          MRN:          [id number]  DATE:04/04/2007                            DOB:          [birth year]    TELEPHONE NUMBER:  [phone]   PRIMARY CARE PHYSICIAN:  Dr. Laury Axon.   SUBJECTIVE:  Starting this evening at around 7pm, she began having  extreme abdominal pain over her epigastric area, as well as right upper  quadrant. She denies fever, chills, and diarrhea. She does have nausea.  She has taken ibuprofen without relief. She is unable to get comfortable  and cannot sit still. She denies any blood in her stool or any other  abdominal pain. She has no history of gall bladder disease or other  abdominal issues. She did have a previous episode on Thursday night that  was similar, but lasted several hours.   ASSESSMENT AND PLAN:  Possible gall bladder colic, gastritis,  pancreatitis, or other etiology of epigastric and right upper quadrant  abdominal pain. Given the fact that her pain has continued from 7pm now  until 3am without relief, I recommended evaluation at the emergency room  to make sure that there is no evidence of cholecystitis or other  concern. If she decides not to go to the emergency room, she will  contact Dr. Laury Axon for further follow up on Monday.     Kerby Nora, MD  Electronically Signed    AB/MedQ  DD: 04/04/2007  DT: 04/04/2007  Job #: [id number]

## 2011-05-30 ENCOUNTER — Encounter: Payer: Self-pay | Admitting: Family Medicine

## 2011-06-02 ENCOUNTER — Ambulatory Visit (INDEPENDENT_AMBULATORY_CARE_PROVIDER_SITE_OTHER): Payer: BC Managed Care – PPO | Admitting: Family Medicine

## 2011-06-02 DIAGNOSIS — H7390 Unspecified disorder of tympanic membrane, unspecified ear: Secondary | ICD-10-CM

## 2011-06-02 DIAGNOSIS — I10 Essential (primary) hypertension: Secondary | ICD-10-CM

## 2011-06-02 NOTE — Progress Notes (Signed)
  Subjective:    Patient ID: Diana Vincent, female    DOB: Mar 09, 1956, 55 y.o.   MRN: [id number]  HPI L TM rupture- saw Dr Annalee Genta at [facility] and a paper patch was applied after 6 weeks of non-healing.  Some pain intermittently.  Continued ear fullness.  Has ENT f/u in a few weeks.  HTN- chronic problem for pt, taking Diovan daily.  No CP, SOB, visual changes, edema.  + HAs- 'it's like a vise around my head'.  [place] and often has this w/ sinus pressure.  Has been taking BP at home and values consistently running 140/80s.     Review of Systems For ROS see HPI     Objective:   Physical Exam  Constitutional: She is oriented to person, place, and time. She appears well-developed and well-nourished. No distress.  HENT:  Head: Normocephalic and atraumatic.       R TM WNL L TM w/ visible paper patch in place.  No evidence of infxn  Eyes: Conjunctivae and EOM are normal. Pupils are equal, round, and reactive to light.  Neck: Normal range of motion. Neck supple. No thyromegaly present.  Cardiovascular: Normal rate, regular rhythm, normal heart sounds and intact distal pulses.   No murmur heard. Pulmonary/Chest: Effort normal and breath sounds normal. No respiratory distress.  Abdominal: Soft. She exhibits no distension. There is no tenderness.  Musculoskeletal: She exhibits no edema.  Lymphadenopathy:    She has no cervical adenopathy.  Neurological: She is alert and oriented to person, place, and time.  Skin: Skin is warm and dry.  Psychiatric: She has a normal mood and affect. Her behavior is normal.          Assessment & Plan:

## 2011-06-02 NOTE — Patient Instructions (Signed)
Schedule your complete physical at your convenience- do not eat before this appt Continue the Diovan daily- if your blood pressures are consistently higher than 140/90, call me and we'll adjust the meds!!! Call with any questions or concerns Hang in there!  You look great!!!

## 2011-06-03 NOTE — Assessment & Plan Note (Signed)
No evidence of current OM.  TM patch in place.  Pt to f/u w/ ENT as scheduled.

## 2011-06-03 NOTE — Assessment & Plan Note (Signed)
BP control is not ideal but pt reports it typically runs lower at home.  Asymptomatic.  No med changes at this time but pt to continue checking at home and let me know if BP remains elevated.

## 2011-07-15 ENCOUNTER — Encounter: Payer: Self-pay | Admitting: Family Medicine

## 2011-07-15 ENCOUNTER — Ambulatory Visit (INDEPENDENT_AMBULATORY_CARE_PROVIDER_SITE_OTHER): Payer: BC Managed Care – PPO | Admitting: Family Medicine

## 2011-07-15 DIAGNOSIS — I10 Essential (primary) hypertension: Secondary | ICD-10-CM

## 2011-07-15 DIAGNOSIS — Z0000 Encounter for general adult medical examination without abnormal findings: Secondary | ICD-10-CM | POA: Insufficient documentation

## 2011-07-15 DIAGNOSIS — R5383 Other fatigue: Secondary | ICD-10-CM

## 2011-07-15 DIAGNOSIS — R5381 Other malaise: Secondary | ICD-10-CM

## 2011-07-15 LAB — CBC WITH DIFFERENTIAL/PLATELET
Basophils Absolute: 0 10*3/uL (ref 0.0–0.1)
Eosinophils Absolute: 0.4 10*3/uL (ref 0.0–0.7)
HCT: 47.3 % — ABNORMAL HIGH (ref 36.0–46.0)
Lymphs Abs: 1.3 10*3/uL (ref 0.7–4.0)
MCV: 96.3 fl (ref 78.0–100.0)
Monocytes Absolute: 0.8 10*3/uL (ref 0.1–1.0)
Neutrophils Relative %: 67.3 % (ref 43.0–77.0)
Platelets: 327 10*3/uL (ref 150.0–400.0)
RDW: 13.5 % (ref 11.5–14.6)

## 2011-07-15 LAB — LDL CHOLESTEROL, DIRECT: Direct LDL: 66.2 mg/dL

## 2011-07-15 LAB — LIPID PANEL
HDL: 44.4 mg/dL (ref >39.00)
Triglycerides: 208 mg/dL — ABNORMAL HIGH (ref 0.0–149.0)
VLDL: 41.6 mg/dL — ABNORMAL HIGH (ref 0.0–40.0)

## 2011-07-15 LAB — BASIC METABOLIC PANEL
Calcium: 8.5 mg/dL (ref 8.4–10.5)
GFR: 80.41 mL/min (ref >60.00)
Sodium: 140 mEq/L (ref 135–145)

## 2011-07-15 LAB — HEPATIC FUNCTION PANEL
Albumin: 3.8 g/dL (ref 3.5–5.2)
Alkaline Phosphatase: 43 U/L (ref 39–117)
Bilirubin, Direct: 0.1 mg/dL (ref 0.0–0.3)
Total Bilirubin: 0.6 mg/dL (ref 0.3–1.2)

## 2011-07-15 NOTE — Patient Instructions (Signed)
Your exam looks good- keep up the good work! We'll notify you of your lab results Try and get regular exercise- this will be hard at the beginning but will hopefully pay big dividends Call with any questions or concerns Hang in there!!!

## 2011-07-15 NOTE — Assessment & Plan Note (Signed)
Excellent control today.  Asymptomatic.  No changes. 

## 2011-07-15 NOTE — Progress Notes (Signed)
  Subjective:    Patient ID: Diana Vincent, female    DOB: February 05, 1956, 55 y.o.   MRN: [id number]  HPI CPE- concerned that she is developing DM b/c of 'my weight, i'm tired, moody- i just don't feel right'.  UTD on pap and mammo Ronna Polio).  UTD on colonoscopy.   Review of Systems Patient reports no vision/ hearing changes, adenopathy, fever, weight change,  persistant/recurrent hoarseness, swallowing issues, chest pain, palpitations, edema, persistant/recurrent cough, hemoptysis, dyspnea (rest/exertional/paroxysmal nocturnal), gastrointestinal bleeding (melena, rectal bleeding), abdominal pain, significant heartburn, bowel changes, GU symptoms (dysuria, hematuria, incontinence), Gyn symptoms (abnormal  bleeding, pain),  syncope, focal weakness, memory loss, numbness & tingling, skin/hair/nail changes, abnormal bruising or bleeding, anxiety, or depression.     Objective:   Physical Exam  General Appearance:    Alert, cooperative, no distress, appears stated age, overweight  Head:    Normocephalic, without obvious abnormality, atraumatic  Eyes:    PERRL, conjunctiva/corneas clear, EOM's intact, fundi    benign, both eyes  Ears:    Normal TM on R, clot obscuring view of L TM (f/u w/ ENT today)  Nose:   Nares normal, septum midline, mucosa normal, no drainage    or sinus tenderness  Throat:   Lips, mucosa, and tongue normal; teeth and gums normal  Neck:   Supple, symmetrical, trachea midline, no adenopathy;    Thyroid: no enlargement/tenderness/nodules  Back:     Symmetric, no curvature, ROM normal, no CVA tenderness  Lungs:     Clear to auscultation bilaterally, respirations unlabored  Chest Wall:    No tenderness or deformity   Heart:    Regular rate and rhythm, S1 and S2 normal, no murmur, rub   or gallop  Breast Exam:    Deferred to GYN  Abdomen:     Soft, non-tender, bowel sounds active all four quadrants,    no masses, no organomegaly  Genitalia:    Deferred to GYN  Rectal:     Deferred to GYN  Extremities:   Extremities normal, atraumatic, no cyanosis or edema  Pulses:   2+ and symmetric all extremities  Skin:   Skin color, texture, turgor normal, no rashes or lesions  Lymph nodes:   Cervical, supraclavicular, and axillary nodes normal  Neurologic:   CNII-XII intact, normal strength, sensation and reflexes    throughout          Assessment & Plan:   No problem-specific assessment & plan notes found for this encounter.

## 2011-07-15 NOTE — Assessment & Plan Note (Signed)
Pt's PE WNL w/ exception of weight.  UTD on health maintenance.  Check labs.  Anticipatory guidance provided.  

## 2011-07-15 NOTE — Assessment & Plan Note (Signed)
No obvious abnormalities on PE.  Will check labs to assess for underlying cause.  Encouraged regular exercise to improve energy levels.  Will follow.

## 2011-07-16 ENCOUNTER — Encounter: Payer: Self-pay | Admitting: *Deleted

## 2011-07-18 LAB — VITAMIN D 1,25 DIHYDROXY
Vitamin D 1, 25 (OH)2 Total: 45 pg/mL (ref 18–72)
Vitamin D2 1, 25 (OH)2: <8 pg/mL
Vitamin D3 1, 25 (OH)2: 45 pg/mL

## 2011-08-15 ENCOUNTER — Encounter: Payer: Self-pay | Admitting: Family Medicine

## 2011-08-15 ENCOUNTER — Ambulatory Visit (INDEPENDENT_AMBULATORY_CARE_PROVIDER_SITE_OTHER): Payer: BC Managed Care – PPO | Admitting: Family Medicine

## 2011-08-15 VITALS — BP 130/84 | HR 100 | Temp 98.8°F | Wt 228.6 lb

## 2011-08-15 DIAGNOSIS — K5289 Other specified noninfective gastroenteritis and colitis: Secondary | ICD-10-CM

## 2011-08-15 DIAGNOSIS — N390 Urinary tract infection, site not specified: Secondary | ICD-10-CM

## 2011-08-15 LAB — POCT URINALYSIS DIPSTICK
Nitrite, UA: POSITIVE
Spec Grav, UA: 1.025
pH, UA: 5

## 2011-08-15 LAB — HEPATIC FUNCTION PANEL
AST: 29 U/L (ref 0–37)
Albumin: 3.4 g/dL — ABNORMAL LOW (ref 3.5–5.2)
Alkaline Phosphatase: 43 U/L (ref 39–117)
Total Protein: 6.6 g/dL (ref 6.0–8.3)

## 2011-08-15 LAB — CBC WITH DIFFERENTIAL/PLATELET
Basophils Absolute: 0 10*3/uL (ref 0.0–0.1)
Basophils Relative: 0 % (ref 0.0–3.0)
Hemoglobin: 15.7 g/dL — ABNORMAL HIGH (ref 12.0–15.0)
Lymphocytes Relative: 4.6 % — ABNORMAL LOW (ref 12.0–46.0)
Monocytes Relative: 4.8 % (ref 3.0–12.0)
Neutro Abs: 11.9 10*3/uL — ABNORMAL HIGH (ref 1.4–7.7)
RBC: 4.89 Mil/uL (ref 3.87–5.11)

## 2011-08-15 LAB — BASIC METABOLIC PANEL
Calcium: 8.6 mg/dL (ref 8.4–10.5)
GFR: 74.89 mL/min (ref >60.00)
Sodium: 140 mEq/L (ref 135–145)

## 2011-08-15 MED ORDER — CIPROFLOXACIN HCL 500 MG PO TABS: 500.0000 mg | ORAL_TABLET | Freq: Two times a day (BID) | ORAL | Status: DC

## 2011-08-15 MED ORDER — CEFTRIAXONE SODIUM 1 G IJ SOLR
1.0000 g | Freq: Once | INTRAMUSCULAR | Status: AC
  Administered 2011-08-15: 1 g via INTRAMUSCULAR

## 2011-08-15 MED ORDER — PROMETHAZINE HCL 25 MG PO TABS: 25.0000 mg | ORAL_TABLET | Freq: Four times a day (QID) | ORAL | Status: AC | PRN

## 2011-08-15 NOTE — Progress Notes (Signed)
  Subjective:     Diana Vincent is a 55 y.o. female who presents for evaluation of nonbilious vomiting 1 times per day, aching pain located in in the lower abdomen, diarrhea 2 times per day and nausea. Symptoms have been present for 1 day. Patient denies blood in stool, constipation, dark urine, dysuria, fever, heartburn, hematemesis, hematuria and melena. Patient's oral intake has been decreased for liquids and decreased for solids. Patient's urine output has been adequate. Other contacts with similar symptoms include: none. Patient denies recent travel history. Patient has had recent ingestion of possible contaminated food, toxic plants, or inappropriate medications/poisons.   + dysuria--- no more NV----last episode diarrhea early this am  The following portions of the patient's history were reviewed and updated as appropriate: allergies, current medications, past family history, past medical history, past social history, past surgical history and problem list.  Review of Systems Pertinent items are noted in HPI.    Objective:     BP 130/84  Pulse 100  Temp(Src) 98.8 F (37.1 C) (Oral)  Wt 228 lb 9.6 oz (103.692 kg)  SpO2 96% General appearance: alert, cooperative, appears stated age and no distress Throat: lips, mucosa, and tongue normal; teeth and gums normal Neck: no adenopathy, no carotid bruit, no JVD, supple, symmetrical, trachea midline and thyroid not enlarged, symmetric, no tenderness/mass/nodules Lungs: clear to auscultation bilaterally Heart: S1, S2 normal Skin: Skin color, texture, turgor normal. No rashes or lesions Lymph nodes: Cervical, supraclavicular, and axillary nodes normal.    Assessment:    Acute Gastroenteritis    Plan:    1. Discussed oral rehydration, reintroduction of solid foods, signs of dehydration. 2. Return or go to emergency department if worsening symptoms, blood or bile, signs of dehydration, diarrhea lasting longer than 5 days or any new  concerns. 3. Follow up in a few days or sooner as needed.

## 2011-08-17 LAB — URINE CULTURE

## 2011-08-26 ENCOUNTER — Other Ambulatory Visit: Payer: Self-pay | Admitting: *Deleted

## 2011-08-26 ENCOUNTER — Other Ambulatory Visit: Payer: Self-pay | Admitting: Family Medicine

## 2011-08-26 DIAGNOSIS — D7289 Other specified disorders of white blood cells: Secondary | ICD-10-CM

## 2011-08-26 MED ORDER — VALSARTAN 160 MG PO TABS: 160.0000 mg | ORAL_TABLET | Freq: Every day | ORAL | Status: DC

## 2011-08-26 NOTE — Telephone Encounter (Signed)
Rx sent 

## 2011-08-27 ENCOUNTER — Other Ambulatory Visit (INDEPENDENT_AMBULATORY_CARE_PROVIDER_SITE_OTHER): Payer: BC Managed Care – PPO

## 2011-08-27 DIAGNOSIS — D7289 Other specified disorders of white blood cells: Secondary | ICD-10-CM

## 2011-08-28 LAB — CBC WITH DIFFERENTIAL/PLATELET
Basophils Absolute: 0 10*3/uL (ref 0.0–0.1)
Eosinophils Absolute: 0.1 10*3/uL (ref 0.0–0.7)
Lymphocytes Relative: 14.7 % (ref 12.0–46.0)
Monocytes Relative: 7 % (ref 3.0–12.0)
Platelets: 340 10*3/uL (ref 150.0–400.0)
RDW: 13.1 % (ref 11.5–14.6)

## 2011-08-29 ENCOUNTER — Encounter: Payer: Self-pay | Admitting: *Deleted

## 2011-09-30 ENCOUNTER — Ambulatory Visit (INDEPENDENT_AMBULATORY_CARE_PROVIDER_SITE_OTHER): Payer: BC Managed Care – PPO | Admitting: Family Medicine

## 2011-09-30 ENCOUNTER — Other Ambulatory Visit: Payer: Self-pay | Admitting: Family Medicine

## 2011-09-30 ENCOUNTER — Ambulatory Visit (HOSPITAL_BASED_OUTPATIENT_CLINIC_OR_DEPARTMENT_OTHER)
Admission: RE | Admit: 2011-09-30 | Discharge: 2011-09-30 | Disposition: A | Discharge status: Home / Self Care | Payer: BC Managed Care – PPO | Source: Ambulatory Visit | Attending: Family Medicine | Admitting: Family Medicine

## 2011-09-30 ENCOUNTER — Encounter: Payer: Self-pay | Admitting: Family Medicine

## 2011-09-30 DIAGNOSIS — R109 Unspecified abdominal pain: Secondary | ICD-10-CM | POA: Insufficient documentation

## 2011-09-30 DIAGNOSIS — N390 Urinary tract infection, site not specified: Secondary | ICD-10-CM

## 2011-09-30 DIAGNOSIS — R319 Hematuria, unspecified: Secondary | ICD-10-CM | POA: Insufficient documentation

## 2011-09-30 DIAGNOSIS — R110 Nausea: Secondary | ICD-10-CM

## 2011-09-30 DIAGNOSIS — R911 Solitary pulmonary nodule: Secondary | ICD-10-CM | POA: Insufficient documentation

## 2011-09-30 DIAGNOSIS — J984 Other disorders of lung: Secondary | ICD-10-CM

## 2011-09-30 LAB — COMPREHENSIVE METABOLIC PANEL
AST: 22
Albumin: 3.6
Alkaline Phosphatase: 62
Chloride: 104
Creatinine, Ser: 0.79
GFR calc Af Amer: >60
Potassium: 4
Total Bilirubin: 0.8
Total Protein: 6.4

## 2011-09-30 MED ORDER — HYDROCODONE-ACETAMINOPHEN 5-500 MG PO TABS: 1.0000 | ORAL_TABLET | ORAL | Status: DC | PRN

## 2011-09-30 MED ORDER — CIPROFLOXACIN HCL 500 MG PO TABS: 500.0000 mg | ORAL_TABLET | Freq: Two times a day (BID) | ORAL | Status: AC

## 2011-09-30 NOTE — Patient Instructions (Signed)
We'll notify you of your CT results- my guess is you have a stone Start the Cipro for presumed infection Take Vicodin as needed for pain We will likely need to refer you to urology Call with any questions or concerns Hang in there!!!

## 2011-09-30 NOTE — Progress Notes (Signed)
  Subjective:    Patient ID: Diana Vincent, female    DOB: 05/08/1956, 55 y.o.   MRN: [id number]  HPI Urinary urgency- started this AM, took AZO.  At 11 am felt 'a hot flush', got 'so weak i can't stand up', reports difficulty breathing right now.  This has happened 3 separate times.  Feels 'lightheaded'.  + LBP.  + body aches.  Some chest tightness but 'feels more respiratory'.  Episodes will last 'several hours'.  Back pain is most bothersome right now- pt unable to walk upright.   Review of Systems For ROS see HPI     Objective:   Physical Exam  Vitals reviewed. Constitutional: She is oriented to person, place, and time. She appears well-developed and well-nourished. She appears distressed (obviously uncomfortable).  HENT:  Head: Normocephalic and atraumatic.  Neck: Normal range of motion. Neck supple.  Cardiovascular: Normal rate, regular rhythm and normal heart sounds.   Pulmonary/Chest: Effort normal and breath sounds normal. No respiratory distress. She has no wheezes. She has no rales.  Abdominal: Soft. She exhibits no distension. There is tenderness (CVA tenderness bilaterally, no suprapubic pain). There is no rebound and no guarding.  Lymphadenopathy:    She has no cervical adenopathy.  Neurological: She is alert and oriented to person, place, and time.  Skin: Skin is warm and dry.          Assessment & Plan:

## 2011-10-01 ENCOUNTER — Telehealth: Payer: Self-pay

## 2011-10-01 LAB — BASIC METABOLIC PANEL
BUN: 13 mg/dL (ref 6–23)
CO2: 25 mEq/L (ref 19–32)
Calcium: 8.8 mg/dL (ref 8.4–10.5)
Chloride: 103 mEq/L (ref 96–112)
Creatinine, Ser: 0.7 mg/dL (ref 0.4–1.2)

## 2011-10-01 LAB — CBC WITH DIFFERENTIAL/PLATELET
Eosinophils Relative: 0.7 % (ref 0.0–5.0)
HCT: 47.7 % — ABNORMAL HIGH (ref 36.0–46.0)
Hemoglobin: 15.7 g/dL — ABNORMAL HIGH (ref 12.0–15.0)
Lymphs Abs: 0.5 10*3/uL — ABNORMAL LOW (ref 0.7–4.0)
MCV: 95.8 fl (ref 78.0–100.0)
Monocytes Absolute: 0.1 10*3/uL (ref 0.1–1.0)
Monocytes Relative: 0.5 % — ABNORMAL LOW (ref 3.0–12.0)
Neutro Abs: 16.2 10*3/uL — ABNORMAL HIGH (ref 1.4–7.7)
Platelets: 322 10*3/uL (ref 150.0–400.0)
WBC: 16.9 10*3/uL — ABNORMAL HIGH (ref 4.5–10.5)

## 2011-10-01 NOTE — Telephone Encounter (Signed)
Pt aware. Pt requesting referral for urology now.  Referral placed

## 2011-10-01 NOTE — Telephone Encounter (Signed)
Message copied by Beverely Low on Wed Oct 01, 2011  2:15 PM ------      Message from: Sheliah Hatch      Created: Tue Sep 30, 2011  7:34 PM       No evidence of kidney stone- this is good news.  She does have a small nodule at the base of her R lung- they recommend f/u CT in 6 months to reassess and make sure it has not grown or changed.

## 2011-10-02 NOTE — Assessment & Plan Note (Addendum)
Pt's level of pain and bright red appearance of urine (despite AZO) is concerning for kidney stone.  Pt's acute onset and multiple episodes in 6 weeks is also concerning.  Will need CT scan to assess.  Start Cipro to cover for possible infxn.  Abdominal exam benign- unlikely to have abdominal process causing pain.  vicodin provided for pain relief.  CT scan was negative for stone, cx was negative for infxn.  WBC indicates inflammatory/infectious process.  Unclear as to what is causing these episodes for pt.  Will need referral to Uro to r/o urinary tract issue.

## 2011-11-05 ENCOUNTER — Encounter: Payer: Self-pay | Admitting: Family Medicine

## 2011-11-05 ENCOUNTER — Ambulatory Visit (INDEPENDENT_AMBULATORY_CARE_PROVIDER_SITE_OTHER): Payer: BC Managed Care – PPO | Admitting: Family Medicine

## 2011-11-05 VITALS — BP 126/88 | HR 85 | Temp 99.3°F | Wt 220.0 lb

## 2011-11-05 DIAGNOSIS — R3915 Urgency of urination: Secondary | ICD-10-CM

## 2011-11-05 DIAGNOSIS — N390 Urinary tract infection, site not specified: Secondary | ICD-10-CM

## 2011-11-05 LAB — POCT URINALYSIS DIPSTICK
Bilirubin, UA: NEGATIVE
Glucose, UA: NEGATIVE
Ketones, UA: NEGATIVE
Spec Grav, UA: 1.02

## 2011-11-05 MED ORDER — CIPROFLOXACIN HCL 500 MG PO TABS: 500.0000 mg | ORAL_TABLET | Freq: Two times a day (BID) | ORAL | Status: AC

## 2011-11-05 NOTE — Progress Notes (Signed)
  Subjective:    Diana Vincent is a 55 y.o. female who complains of burning with urination and frequency. She has had symptoms for 2 days. Patient also complains of back pain. Patient denies congestion, cough, fever, headache, rhinitis, sorethroat, stomach ache and vaginal discharge. Patient does have a history of recurrent UTI. Patient does not have a history of pyelonephritis.   The following portions of the patient's history were reviewed and updated as appropriate: allergies, current medications, past family history, past medical history, past social history, past surgical history and problem list.  Review of Systems Pertinent items are noted in HPI.    Objective:    BP 126/88  Pulse 85  Temp(Src) 99.3 F (37.4 C) (Oral)  Wt 220 lb (99.791 kg)  SpO2 95% General appearance: alert, cooperative, appears stated age and no distress Lungs: clear to auscultation bilaterally Abdomen: abnormal findings:  mild tenderness in the lower abdomen  Laboratory:  Urine dipstick: trace for hemoglobin and trace for leukocyte esterase.   Micro exam: not done.    Assessment:    Acute cystitis and UTI     Plan:    Medications: ciprofloxacin. Maintain adequate hydration. Follow up if symptoms not improving, and as needed.

## 2011-11-05 NOTE — Patient Instructions (Signed)

## 2011-11-07 LAB — URINE CULTURE

## 2011-11-18 ENCOUNTER — Other Ambulatory Visit: Payer: Self-pay | Admitting: Family Medicine

## 2011-11-18 ENCOUNTER — Other Ambulatory Visit (INDEPENDENT_AMBULATORY_CARE_PROVIDER_SITE_OTHER): Payer: BC Managed Care – PPO

## 2011-11-18 DIAGNOSIS — N390 Urinary tract infection, site not specified: Secondary | ICD-10-CM

## 2011-11-18 LAB — POCT URINALYSIS DIPSTICK
Ketones, UA: NEGATIVE
Protein, UA: NEGATIVE
Spec Grav, UA: 1.015
pH, UA: 5

## 2011-11-19 ENCOUNTER — Other Ambulatory Visit: Payer: BC Managed Care – PPO

## 2011-11-21 ENCOUNTER — Telehealth: Payer: Self-pay | Admitting: *Deleted

## 2011-11-21 DIAGNOSIS — N3941 Urge incontinence: Secondary | ICD-10-CM

## 2011-11-21 DIAGNOSIS — IMO0001 Reserved for inherently not codable concepts without codable children: Secondary | ICD-10-CM

## 2011-11-21 NOTE — Telephone Encounter (Signed)
Urine showed no signs of infxn.  May have interstitial cystitis.  Needs referral to URO (in [place]) for 2nd opinion

## 2011-11-21 NOTE — Telephone Encounter (Signed)
Spoke to pt to advise results/instructions. Pt understood. Referral sent to Urology. Pt aware someone will call her at the beginning of the week about an appt with URO.pt understood

## 2011-11-21 NOTE — Telephone Encounter (Signed)
Pt called into to say she is still having urinary urgency-pressure-and frequency, she is post ABT, she came in on Tuesday to have a recheck of urine. What are the next steps she can take?

## 2011-12-11 ENCOUNTER — Ambulatory Visit (INDEPENDENT_AMBULATORY_CARE_PROVIDER_SITE_OTHER): Payer: BC Managed Care – PPO | Admitting: Family Medicine

## 2011-12-11 ENCOUNTER — Encounter: Payer: Self-pay | Admitting: Family Medicine

## 2011-12-11 VITALS — BP 120/82 | HR 77 | Temp 97.5°F | Ht 65.0 in | Wt 219.4 lb

## 2011-12-11 DIAGNOSIS — J019 Acute sinusitis, unspecified: Secondary | ICD-10-CM

## 2011-12-11 MED ORDER — CLARITHROMYCIN ER 500 MG PO TB24: 1000.0000 mg | ORAL_TABLET | Freq: Every day | ORAL | Status: AC

## 2011-12-11 NOTE — Patient Instructions (Signed)
You have a sinus infection Start the Biaxin- 2 tabs at the same time- w/ food Drink plenty of fluids REST! Hang in there! Happy New Year!

## 2011-12-11 NOTE — Progress Notes (Signed)
  Subjective:    Patient ID: Diana Vincent, female    DOB: [birth year], 55 y.o.   MRN: [id number]  HPI URI- sxs started w/ a cold 3 weeks ago.  Congestion has 'steadily worsened'.  Now w/ facial pain and pressure.  + tooth pain, ear pain.  No fevers.  + sick contacts.  Minimal cough.   Review of Systems For ROS see HPI     Objective:   Physical Exam  Vitals reviewed. Constitutional: She appears well-developed and well-nourished. No distress.  HENT:  Head: Normocephalic and atraumatic.  Right Ear: Tympanic membrane normal.  Left Ear: Tympanic membrane normal.  Nose: Mucosal edema and rhinorrhea present. Right sinus exhibits maxillary sinus tenderness and frontal sinus tenderness. Left sinus exhibits maxillary sinus tenderness and frontal sinus tenderness.  Mouth/Throat: Uvula is midline and mucous membranes are normal. Posterior oropharyngeal erythema present. No oropharyngeal exudate.  Eyes: Conjunctivae and EOM are normal. Pupils are equal, round, and reactive to light.  Neck: Normal range of motion. Neck supple.  Cardiovascular: Normal rate, regular rhythm and normal heart sounds.   Pulmonary/Chest: Effort normal and breath sounds normal. No respiratory distress. She has no wheezes.  Lymphadenopathy:    She has no cervical adenopathy.          Assessment & Plan:

## 2011-12-11 NOTE — Assessment & Plan Note (Signed)
Pt's sxs and PE consistent w/ infxn.  Start abx.  Reviewed supportive care and red flags that should prompt return.  Pt expressed understanding and is in agreement w/ plan.  

## 2011-12-15 ENCOUNTER — Other Ambulatory Visit: Payer: Self-pay | Admitting: Family Medicine

## 2011-12-15 MED ORDER — VALSARTAN 160 MG PO TABS: 160.0000 mg | ORAL_TABLET | Freq: Every day | ORAL | Status: DC

## 2011-12-15 NOTE — Telephone Encounter (Signed)
rx sent to pharmacy by e-script  

## 2012-04-19 ENCOUNTER — Other Ambulatory Visit: Payer: Self-pay | Admitting: Family Medicine

## 2012-04-19 DIAGNOSIS — R911 Solitary pulmonary nodule: Secondary | ICD-10-CM

## 2012-04-19 MED ORDER — VALSARTAN 160 MG PO TABS: 160.0000 mg | ORAL_TABLET | Freq: Every day | ORAL | Status: DC

## 2012-04-19 NOTE — Telephone Encounter (Signed)
rx sent to pharmacy by e-script Also contacted pt to advise that MD Beverely Low has set her up for another CT Scan per last CT Scan was abnormal and she is in need of a follow up CT Scan, someone will contact her about the scan apt.

## 2012-04-19 NOTE — Telephone Encounter (Signed)
refill diovan 160mg  tablets Qty 30 Take one tablet by mouth every day  Last filled 4.5.13 Last ov 12.27.12

## 2012-04-23 ENCOUNTER — Other Ambulatory Visit (HOSPITAL_BASED_OUTPATIENT_CLINIC_OR_DEPARTMENT_OTHER): Payer: BC Managed Care – PPO

## 2012-04-26 ENCOUNTER — Other Ambulatory Visit (HOSPITAL_BASED_OUTPATIENT_CLINIC_OR_DEPARTMENT_OTHER): Payer: BC Managed Care – PPO

## 2012-04-28 ENCOUNTER — Encounter: Payer: Self-pay | Admitting: *Deleted

## 2012-04-28 ENCOUNTER — Ambulatory Visit (HOSPITAL_BASED_OUTPATIENT_CLINIC_OR_DEPARTMENT_OTHER)
Admission: RE | Admit: 2012-04-28 | Discharge: 2012-04-28 | Disposition: A | Discharge status: Home / Self Care | Payer: BC Managed Care – PPO | Source: Ambulatory Visit | Attending: Family Medicine | Admitting: Family Medicine

## 2012-04-28 DIAGNOSIS — R911 Solitary pulmonary nodule: Secondary | ICD-10-CM

## 2012-04-28 DIAGNOSIS — J984 Other disorders of lung: Secondary | ICD-10-CM | POA: Insufficient documentation

## 2012-05-19 ENCOUNTER — Other Ambulatory Visit: Payer: Self-pay | Admitting: Obstetrics and Gynecology

## 2012-06-21 ENCOUNTER — Ambulatory Visit (INDEPENDENT_AMBULATORY_CARE_PROVIDER_SITE_OTHER): Payer: BC Managed Care – PPO | Admitting: Family Medicine

## 2012-06-21 ENCOUNTER — Encounter: Payer: Self-pay | Admitting: Family Medicine

## 2012-06-21 VITALS — BP 125/78 | HR 85 | Temp 98.1°F | Ht 65.5 in | Wt 224.0 lb

## 2012-06-21 DIAGNOSIS — S0100XA Unspecified open wound of scalp, initial encounter: Secondary | ICD-10-CM

## 2012-06-21 DIAGNOSIS — S0101XA Laceration without foreign body of scalp, initial encounter: Secondary | ICD-10-CM | POA: Insufficient documentation

## 2012-06-21 DIAGNOSIS — R6884 Jaw pain: Secondary | ICD-10-CM | POA: Insufficient documentation

## 2012-06-21 MED ORDER — CYCLOBENZAPRINE HCL 10 MG PO TABS: 10.0000 mg | ORAL_TABLET | Freq: Three times a day (TID) | ORAL | Status: AC | PRN

## 2012-06-21 NOTE — Patient Instructions (Addendum)
Call your dentist and discuss the jaw pain Start the flexeril to relax the large muscles of the jaw The incision looks great- no restrictions at this time Call with any questions or concerns Hang in there!!!

## 2012-06-21 NOTE — Progress Notes (Signed)
  Subjective:    Patient ID: Diana Vincent, female    DOB: [birth year], 56 y.o.   MRN: [id number]  HPI Fall- was in [place] visiting parents when porch swing gave way.  Fall backwards, scraping head down porch railing before hitting concrete.  Went to ER- had CT scan that was negative.  Required 6 staples.  Got tetanus shot and abx.  Pt reports persistent jaw pain bilaterally since falling.  HA has improved w/ time and ibuprofen- never filled script for pain meds.  Limited mouth opening due to pain.   Review of Systems For ROS see HPI     Objective:   Physical Exam  Constitutional: She appears well-developed and well-nourished. No distress.  HENT:       Well healing head lac w/out evidence of infxn- 6 staples present Some tenderness to palpation over masseter bilaterally          Assessment & Plan:

## 2012-06-28 ENCOUNTER — Encounter (HOSPITAL_COMMUNITY): Payer: Self-pay | Admitting: Pharmacist

## 2012-07-01 ENCOUNTER — Encounter (HOSPITAL_COMMUNITY): Payer: Self-pay

## 2012-07-01 ENCOUNTER — Encounter (HOSPITAL_COMMUNITY)
Admission: RE | Admit: 2012-07-01 | Discharge: 2012-07-01 | Disposition: A | Discharge status: Home / Self Care | Payer: BC Managed Care – PPO | Source: Ambulatory Visit | Attending: Obstetrics and Gynecology | Admitting: Obstetrics and Gynecology

## 2012-07-01 ENCOUNTER — Other Ambulatory Visit: Payer: Self-pay

## 2012-07-01 HISTORY — DX: Unspecified injury of head, initial encounter: S09.90XA

## 2012-07-01 HISTORY — DX: Fracture of mandible, unspecified, initial encounter for closed fracture: S02.609A

## 2012-07-01 LAB — BASIC METABOLIC PANEL
CO2: 30 mEq/L (ref 19–32)
Calcium: 9.3 mg/dL (ref 8.4–10.5)
GFR calc Af Amer: >90 mL/min (ref >90)
GFR calc non Af Amer: >90 mL/min (ref >90)
Sodium: 140 mEq/L (ref 135–145)

## 2012-07-01 LAB — CBC
HCT: 47.8 % — ABNORMAL HIGH (ref 36.0–46.0)
Hemoglobin: 15.8 g/dL — ABNORMAL HIGH (ref 12.0–15.0)
WBC: 8.4 10*3/uL (ref 4.0–10.5)

## 2012-07-01 NOTE — Patient Instructions (Addendum)
YOUR PROCEDURE IS SCHEDULED ON:07/07/12  ENTER THROUGH THE MAIN ENTRANCE OF [facility] AT:10 am  USE DESK PHONE AND DIAL [phone] TO INFORM us OF YOUR ARRIVAL  CALL [phone] IF YOU HAVE ANY QUESTIONS OR PROBLEMS PRIOR TO YOUR ARRIVAL.  REMEMBER: DO NOT EAT OR DRINK AFTER MIDNIGHT : Tuesday  Water ok until 7am on Wed.  YOU MAY BRUSH YOUR TEETH THE MORNING OF SURGERY   TAKE THESE MEDICINES THE DAY OF SURGERY WITH SIP OF WATER: Diovan, Cymbalta, Wellbutrin   DO NOT WEAR JEWELRY, EYE MAKEUP, LIPSTICK OR DARK FINGERNAIL POLISH DO NOT WEAR LOTIONS  DO NOT SHAVE FOR 48 HOURS PRIOR TO SURGERY  YOU WILL NOT BE ALLOWED TO DRIVE YOURSELF HOME.

## 2012-07-02 ENCOUNTER — Other Ambulatory Visit: Payer: Self-pay | Admitting: Obstetrics and Gynecology

## 2012-07-06 NOTE — H&P (Signed)
56 y.o. yo complains of SUI, cystocele and rectocele.  Pt c/o leaking urine with coughing, jumping, and orgasm.  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  She does not have any leaking with urge but has significant leaking with stress on the pelvic floor.  She also has to splint in order to have a bowel movement.  Past Medical History  Diagnosis Date  . Depression   . Hypertension   . Allergic rhinitis   . Head injury without skull fracture     no LOC  . Mandible fracture June 2013    hairline fx from fall out of swing-    Past Surgical History  Procedure Date  . Abdominal hysterectomy 1990s  . Cholecystectomy     History   Social History  . Marital Status: Married    Spouse Name: N/A    Number of Children: N/A  . Years of Education: N/A   Occupational History  . Not on file.   Social History Main Topics  . Smoking status: Never Smoker   . Smokeless tobacco: Not on file  . Alcohol Use: Yes     5 per week  . Drug Use: No  . Sexually Active:    Other Topics Concern  . Not on file   Social History Narrative  . No narrative on file    No current facility-administered medications on file prior to encounter.   Current Outpatient Prescriptions on File Prior to Encounter  Medication Sig Dispense Refill  . buPROPion (WELLBUTRIN XL) 150 MG 24 hr tablet Take 300 mg by mouth daily.       . fluticasone (FLONASE) 50 MCG/ACT nasal spray Place 1-2 sprays into the nose at bedtime as needed. For allergies      . loratadine (CLARITIN) 10 MG tablet Take 10 mg by mouth daily.      . valsartan (DIOVAN) 160 MG tablet Take 1 tablet (160 mg total) by mouth daily.  30 tablet  3  . VESICARE 10 MG tablet Take 10 mg by mouth daily.       Marland Kitchen zolpidem (AMBIEN) 10 MG tablet Take 5 mg by mouth at bedtime as needed.         Allergies  Allergen Reactions  . Amlodipine Besylate     REACTION: swimmy headed  . Lisinopril     REACTION:  cough  . Metoprolol Tartrate     REACTION: fatigue    @VITALS2 @  Lungs: clear to ascultation Cor:  RRR Abdomen:  soft, nontender, nondistended. Ex:  no cords, erythema Pelvic:  Surgically absent uterus; cuff suspended, cystocele to 0, rectocele to 0, >45 degree urethra.  No masses.  Cystometrics:  Detrusor unstable even on vessicare but not severe.  LPP 120; normal compliance and volume.  A:  For TVT/A and P repair and cysto for SUI, cystocele, rectocele.  Pt understands that DI may become worse for a while after surgery and we will continue vessicare.   P:  All risks, benefits and alternatives d/w patient and she desires to proceed .  Patient will receive preop antibiotics and SCDs during the operation.     Naw Lasala A

## 2012-07-06 NOTE — Assessment & Plan Note (Signed)
New.  Start flexeril to relax masseters and f/u w/ dentist regarding possible malocclusion since fall.  Pt expressed understanding and is in agreement w/ plan.

## 2012-07-06 NOTE — Assessment & Plan Note (Signed)
New.  Well healing.  6 staples successfully removed and pt tolerated procedure w/out difficulty.

## 2012-07-07 ENCOUNTER — Ambulatory Visit (HOSPITAL_COMMUNITY): Payer: BC Managed Care – PPO | Admitting: Anesthesiology

## 2012-07-07 ENCOUNTER — Encounter (HOSPITAL_COMMUNITY): Payer: Self-pay | Admitting: Anesthesiology

## 2012-07-07 ENCOUNTER — Observation Stay (HOSPITAL_COMMUNITY)
Admission: RE | Admit: 2012-07-07 | Discharge: 2012-07-07 | Disposition: A | Discharge status: Home / Self Care | Payer: BC Managed Care – PPO | Source: Ambulatory Visit | Attending: Obstetrics and Gynecology | Admitting: Obstetrics and Gynecology

## 2012-07-07 ENCOUNTER — Encounter (HOSPITAL_COMMUNITY)
Admission: RE | Disposition: A | Discharge status: Home / Self Care | Payer: Self-pay | Source: Ambulatory Visit | Attending: Obstetrics and Gynecology

## 2012-07-07 DIAGNOSIS — Z9889 Other specified postprocedural states: Secondary | ICD-10-CM

## 2012-07-07 DIAGNOSIS — N993 Prolapse of vaginal vault after hysterectomy: Secondary | ICD-10-CM | POA: Insufficient documentation

## 2012-07-07 DIAGNOSIS — N393 Stress incontinence (female) (male): Principal | ICD-10-CM | POA: Insufficient documentation

## 2012-07-07 DIAGNOSIS — Z01812 Encounter for preprocedural laboratory examination: Secondary | ICD-10-CM | POA: Insufficient documentation

## 2012-07-07 DIAGNOSIS — Z01818 Encounter for other preprocedural examination: Secondary | ICD-10-CM | POA: Insufficient documentation

## 2012-07-07 HISTORY — PX: CYSTOSCOPY: SHX5120

## 2012-07-07 HISTORY — PX: BLADDER SUSPENSION: SHX72

## 2012-07-07 HISTORY — PX: ANTERIOR AND POSTERIOR REPAIR: SHX5121

## 2012-07-07 SURGERY — TRANSVAGINAL TAPE (TVT) PROCEDURE
Anesthesia: General | Wound class: Clean Contaminated

## 2012-07-07 MED ORDER — SODIUM CHLORIDE 0.9 % IV SOLN: 250.0000 mL | INTRAVENOUS | Status: DC | PRN

## 2012-07-07 MED ORDER — MEPERIDINE HCL 25 MG/ML IJ SOLN: 6.2500 mg | INTRAMUSCULAR | Status: DC | PRN

## 2012-07-07 MED ORDER — KETOROLAC TROMETHAMINE 30 MG/ML IJ SOLN
15.0000 mg | Freq: Once | INTRAMUSCULAR | Status: AC | PRN
  Administered 2012-07-07: 30 mg via INTRAVENOUS

## 2012-07-07 MED ORDER — SODIUM CHLORIDE 0.9 % IJ SOLN: 3.0000 mL | INTRAMUSCULAR | Status: DC | PRN

## 2012-07-07 MED ORDER — ROCURONIUM BROMIDE 50 MG/5ML IV SOLN
INTRAVENOUS | Status: AC
  Filled 2012-07-07: qty 1

## 2012-07-07 MED ORDER — ESTRADIOL 0.1 MG/GM VA CREA
TOPICAL_CREAM | VAGINAL | Status: AC
  Filled 2012-07-07: qty 42.5

## 2012-07-07 MED ORDER — OXYCODONE-ACETAMINOPHEN 5-325 MG PO TABS
1.0000 | ORAL_TABLET | ORAL | Status: DC | PRN
  Administered 2012-07-07: 2 via ORAL

## 2012-07-07 MED ORDER — NEOSTIGMINE METHYLSULFATE 1 MG/ML IJ SOLN
INTRAMUSCULAR | Status: AC
  Filled 2012-07-07: qty 10

## 2012-07-07 MED ORDER — FENTANYL CITRATE 0.05 MG/ML IJ SOLN
INTRAMUSCULAR | Status: AC
  Filled 2012-07-07: qty 2

## 2012-07-07 MED ORDER — GLYCOPYRROLATE 0.2 MG/ML IJ SOLN
INTRAMUSCULAR | Status: AC
  Filled 2012-07-07: qty 1

## 2012-07-07 MED ORDER — MIDAZOLAM HCL 5 MG/5ML IJ SOLN
INTRAMUSCULAR | Status: DC | PRN
  Administered 2012-07-07: 2 mg via INTRAVENOUS

## 2012-07-07 MED ORDER — FENTANYL CITRATE 0.05 MG/ML IJ SOLN
INTRAMUSCULAR | Status: AC
  Filled 2012-07-07: qty 5

## 2012-07-07 MED ORDER — METOCLOPRAMIDE HCL 5 MG/ML IJ SOLN
INTRAMUSCULAR | Status: AC
  Administered 2012-07-07: 10 mg via INTRAVENOUS
  Filled 2012-07-07: qty 2

## 2012-07-07 MED ORDER — ONDANSETRON HCL 4 MG/2ML IJ SOLN
INTRAMUSCULAR | Status: DC | PRN
  Administered 2012-07-07: 4 mg via INTRAVENOUS

## 2012-07-07 MED ORDER — DEXAMETHASONE SODIUM PHOSPHATE 10 MG/ML IJ SOLN
INTRAMUSCULAR | Status: AC
  Filled 2012-07-07: qty 1

## 2012-07-07 MED ORDER — OXYCODONE-ACETAMINOPHEN 5-325 MG PO TABS
ORAL_TABLET | ORAL | Status: AC
  Administered 2012-07-07: 2 via ORAL
  Filled 2012-07-07: qty 2

## 2012-07-07 MED ORDER — FENTANYL CITRATE 0.05 MG/ML IJ SOLN
INTRAMUSCULAR | Status: DC | PRN
  Administered 2012-07-07 (×5): 50 ug via INTRAVENOUS

## 2012-07-07 MED ORDER — LIDOCAINE-EPINEPHRINE 0.5 %-1:200000 IJ SOLN
INTRAMUSCULAR | Status: DC | PRN
  Administered 2012-07-07: 6 mL

## 2012-07-07 MED ORDER — GLYCOPYRROLATE 0.2 MG/ML IJ SOLN
INTRAMUSCULAR | Status: AC
  Filled 2012-07-07: qty 2

## 2012-07-07 MED ORDER — STERILE WATER FOR IRRIGATION IR SOLN
Status: DC | PRN
  Administered 2012-07-07: 1000 mL

## 2012-07-07 MED ORDER — EPHEDRINE SULFATE 50 MG/ML IJ SOLN
INTRAMUSCULAR | Status: DC | PRN
  Administered 2012-07-07: 5 mg via INTRAVENOUS

## 2012-07-07 MED ORDER — INDIGOTINDISULFONATE SODIUM 8 MG/ML IJ SOLN
INTRAMUSCULAR | Status: DC | PRN
  Administered 2012-07-07: 3 mL via INTRAVENOUS

## 2012-07-07 MED ORDER — CEFAZOLIN SODIUM-DEXTROSE 2-3 GM-% IV SOLR
INTRAVENOUS | Status: AC
  Filled 2012-07-07: qty 50

## 2012-07-07 MED ORDER — ESTRADIOL 0.1 MG/GM VA CREA
TOPICAL_CREAM | VAGINAL | Status: DC | PRN
  Administered 2012-07-07: 1 via VAGINAL

## 2012-07-07 MED ORDER — FENTANYL CITRATE 0.05 MG/ML IJ SOLN
INTRAMUSCULAR | Status: AC
  Administered 2012-07-07: 50 ug via INTRAVENOUS
  Filled 2012-07-07: qty 2

## 2012-07-07 MED ORDER — DEXAMETHASONE SODIUM PHOSPHATE 4 MG/ML IJ SOLN
INTRAMUSCULAR | Status: DC | PRN
  Administered 2012-07-07: 10 mg via INTRAVENOUS

## 2012-07-07 MED ORDER — PROPOFOL 10 MG/ML IV EMUL
INTRAVENOUS | Status: AC
  Filled 2012-07-07: qty 20

## 2012-07-07 MED ORDER — LIDOCAINE-EPINEPHRINE 0.5 %-1:200000 IJ SOLN
INTRAMUSCULAR | Status: AC
  Filled 2012-07-07: qty 1

## 2012-07-07 MED ORDER — BUTORPHANOL TARTRATE 1 MG/ML IJ SOLN: 1.0000 mg | INTRAMUSCULAR | Status: DC | PRN

## 2012-07-07 MED ORDER — GLYCOPYRROLATE 0.2 MG/ML IJ SOLN
INTRAMUSCULAR | Status: DC | PRN
  Administered 2012-07-07: 0.1 mg via INTRAVENOUS

## 2012-07-07 MED ORDER — EPHEDRINE 5 MG/ML INJ
INTRAVENOUS | Status: AC
  Filled 2012-07-07: qty 10

## 2012-07-07 MED ORDER — SODIUM CHLORIDE 0.9 % IJ SOLN: 3.0000 mL | Freq: Two times a day (BID) | INTRAMUSCULAR | Status: DC

## 2012-07-07 MED ORDER — LACTATED RINGERS IV SOLN
INTRAVENOUS | Status: DC
  Administered 2012-07-07 (×2): via INTRAVENOUS

## 2012-07-07 MED ORDER — LIDOCAINE HCL (CARDIAC) 20 MG/ML IV SOLN
INTRAVENOUS | Status: AC
  Filled 2012-07-07: qty 5

## 2012-07-07 MED ORDER — ONDANSETRON HCL 4 MG/2ML IJ SOLN
INTRAMUSCULAR | Status: AC
  Filled 2012-07-07: qty 2

## 2012-07-07 MED ORDER — LIDOCAINE HCL (CARDIAC) 20 MG/ML IV SOLN
INTRAVENOUS | Status: DC | PRN
  Administered 2012-07-07: 80 mg via INTRAVENOUS

## 2012-07-07 MED ORDER — PRENATAL MULTIVITAMIN CH: 1.0000 | ORAL_TABLET | Freq: Every day | ORAL | Status: DC

## 2012-07-07 MED ORDER — KETOROLAC TROMETHAMINE 30 MG/ML IJ SOLN
INTRAMUSCULAR | Status: AC
  Administered 2012-07-07: 30 mg via INTRAVENOUS
  Filled 2012-07-07: qty 1

## 2012-07-07 MED ORDER — FENTANYL CITRATE 0.05 MG/ML IJ SOLN
25.0000 ug | INTRAMUSCULAR | Status: DC | PRN
  Administered 2012-07-07 (×4): 50 ug via INTRAVENOUS

## 2012-07-07 MED ORDER — OXYCODONE-ACETAMINOPHEN 5-325 MG PO TABS: 1.0000 | ORAL_TABLET | ORAL | Status: AC | PRN

## 2012-07-07 MED ORDER — PROPOFOL 10 MG/ML IV EMUL
INTRAVENOUS | Status: DC | PRN
  Administered 2012-07-07: 200 mg via INTRAVENOUS

## 2012-07-07 MED ORDER — MIDAZOLAM HCL 2 MG/2ML IJ SOLN: 0.5000 mg | Freq: Once | INTRAMUSCULAR | Status: DC | PRN

## 2012-07-07 MED ORDER — PROMETHAZINE HCL 25 MG/ML IJ SOLN: 6.2500 mg | INTRAMUSCULAR | Status: DC | PRN

## 2012-07-07 MED ORDER — CEFAZOLIN SODIUM-DEXTROSE 2-3 GM-% IV SOLR
2.0000 g | INTRAVENOUS | Status: AC
  Administered 2012-07-07: 2 g via INTRAVENOUS

## 2012-07-07 MED ORDER — MIDAZOLAM HCL 2 MG/2ML IJ SOLN
INTRAMUSCULAR | Status: AC
  Filled 2012-07-07: qty 2

## 2012-07-07 MED ORDER — METOCLOPRAMIDE HCL 5 MG/ML IJ SOLN
10.0000 mg | Freq: Once | INTRAMUSCULAR | Status: AC
  Administered 2012-07-07: 10 mg via INTRAVENOUS

## 2012-07-07 SURGICAL SUPPLY — 34 items
BLADE SURG 15 STRL LF C SS BP (BLADE) ×2 IMPLANT
BLADE SURG 15 STRL SS (BLADE) ×1
CANISTER SUCTION 2500CC (MISCELLANEOUS) ×3 IMPLANT
CATH BONANNO SUPRAPUBIC 14G (CATHETERS) IMPLANT
CLOTH BEACON ORANGE TIMEOUT ST (SAFETY) ×3 IMPLANT
CONT PATH 16OZ SNAP LID 3702 (MISCELLANEOUS) IMPLANT
DECANTER SPIKE VIAL GLASS SM (MISCELLANEOUS) IMPLANT
DERMABOND ADVANCED (GAUZE/BANDAGES/DRESSINGS) ×1
DERMABOND ADVANCED .7 DNX12 (GAUZE/BANDAGES/DRESSINGS) ×2 IMPLANT
GAUZE PACKING 1 X5 YD ST (GAUZE/BANDAGES/DRESSINGS) IMPLANT
GAUZE PACKING 2X5 YD STERILE (GAUZE/BANDAGES/DRESSINGS) ×3 IMPLANT
GLOVE BIO SURGEON STRL SZ7 (GLOVE) ×6 IMPLANT
GOWN PREVENTION PLUS LG XLONG (DISPOSABLE) ×12 IMPLANT
GOWN STRL REIN XL XLG (GOWN DISPOSABLE) ×3 IMPLANT
NEEDLE HYPO 22GX1.5 SAFETY (NEEDLE) ×3 IMPLANT
NEEDLE SPNL 22GX3.5 QUINCKE BK (NEEDLE) IMPLANT
NS IRRIG 1000ML POUR BTL (IV SOLUTION) ×3 IMPLANT
PACK VAGINAL WOMENS (CUSTOM PROCEDURE TRAY) ×3 IMPLANT
PLUG CATH AND CAP STER (CATHETERS) ×3 IMPLANT
SET CYSTO W/LG BORE CLAMP LF (SET/KITS/TRAYS/PACK) ×3 IMPLANT
SLING TRANS VAGINAL TAPE (Sling) ×1 IMPLANT
SLING UTERINE/ABD GYNECARE TVT (Sling) ×2 IMPLANT
SPONGE SURGIFOAM ABS GEL 12-7 (HEMOSTASIS) ×3 IMPLANT
SUT VIC AB 0 CT1 18XCR BRD8 (SUTURE) ×2 IMPLANT
SUT VIC AB 0 CT1 8-18 (SUTURE) ×1
SUT VIC AB 2-0 CT1 27 (SUTURE) ×4
SUT VIC AB 2-0 CT1 TAPERPNT 27 (SUTURE) ×8 IMPLANT
SUT VIC AB 2-0 SH 27 (SUTURE)
SUT VIC AB 2-0 SH 27XBRD (SUTURE) IMPLANT
SUT VIC AB 2-0 UR6 27 (SUTURE) IMPLANT
SYR 50ML LL SCALE MARK (SYRINGE) IMPLANT
TOWEL OR 17X24 6PK STRL BLUE (TOWEL DISPOSABLE) ×6 IMPLANT
TRAY FOLEY CATH 14FR (SET/KITS/TRAYS/PACK) ×3 IMPLANT
WATER STERILE IRR 1000ML POUR (IV SOLUTION) ×3 IMPLANT

## 2012-07-07 NOTE — Transfer of Care (Signed)
Immediate Anesthesia Transfer of Care Note  Patient: Diana Vincent  Procedure(s) Performed: Procedure(s) (LRB): TRANSVAGINAL TAPE (TVT) PROCEDURE (N/A) ANTERIOR (CYSTOCELE) AND POSTERIOR REPAIR (RECTOCELE) (N/A) CYSTOSCOPY ()  Patient Location: PACU  Anesthesia Type: General  Level of Consciousness: awake, alert  and oriented  Airway & Oxygen Therapy: Patient Spontanous Breathing and Patient connected to nasal cannula oxygen  Post-op Assessment: Report given to PACU RN and Post -op Vital signs reviewed and stable  Post vital signs: Reviewed and stable  Complications: No apparent anesthesia complications

## 2012-07-07 NOTE — Anesthesia Preprocedure Evaluation (Signed)
Anesthesia Evaluation  Patient identified by MRN, date of birth, ID band Patient awake    Reviewed: Allergy & Precautions, H&P , Patient's Chart, lab work & pertinent test results, reviewed documented beta blocker date and time   History of Anesthesia Complications Negative for: history of anesthetic complications  Airway Mallampati: III TM Distance: >3 FB Neck ROM: full    Dental No notable dental hx.    Pulmonary neg pulmonary ROS,  breath sounds clear to auscultation  Pulmonary exam normal       Cardiovascular Exercise Tolerance: Good hypertension, negative cardio ROS  Rhythm:regular Rate:Normal     Neuro/Psych  Headaches, PSYCHIATRIC DISORDERS Depression negative neurological ROS  negative psych ROS   GI/Hepatic negative GI ROS, Neg liver ROS,   Endo/Other  negative endocrine ROSMorbid obesity  Renal/GU negative Renal ROS     Musculoskeletal   Abdominal   Peds  Hematology negative hematology ROS (+)   Anesthesia Other Findings Allergic rhinitis     Head injury without skull fracture   no LOC    Mandible fracture June 2013 hairline fx from fall out of swing-     Reproductive/Obstetrics negative OB ROS                           Anesthesia Physical Anesthesia Plan  ASA: III  Anesthesia Plan: General LMA   Post-op Pain Management:    Induction:   Airway Management Planned:   Additional Equipment:   Intra-op Plan:   Post-operative Plan:   Informed Consent: I have reviewed the patients History and Physical, chart, labs and discussed the procedure including the risks, benefits and alternatives for the proposed anesthesia with the patient or authorized representative who has indicated his/her understanding and acceptance.   Dental Advisory Given  Plan Discussed with: CRNA, Surgeon and Anesthesiologist  Anesthesia Plan Comments:         Anesthesia Quick Evaluation

## 2012-07-07 NOTE — Brief Op Note (Signed)
07/07/2012  11:18 AM  PATIENT:  Diana Vincent  56 y.o. female  PRE-OPERATIVE DIAGNOSIS:  Stress Urinary Incontinence; Cystocele and Rectocele  POST-OPERATIVE DIAGNOSIS:  Stress Urinary Incontinence; Cystocele; Rectocele  PROCEDURE:  Procedure(s) (LRB): TRANSVAGINAL TAPE (TVT) PROCEDURE (N/Vincent) ANTERIOR (CYSTOCELE) AND POSTERIOR REPAIR (RECTOCELE) (N/Vincent) CYSTOSCOPY ()  SURGEON:  Surgeon(s) and Role:    * Loney Laurence, MD - Primary    * W Lodema Hong, MD - Assisting  ANESTHESIA:   general  EBL:  Total I/O In: 1000 [I.V.:1000] Out: 200 [Urine:100; Blood:100]   LOCAL MEDICATIONS USED:  LIDOCAINE, gelfoam   SPECIMEN:  No Specimen  DISPOSITION OF SPECIMEN:  N/Vincent  COUNTS:  YES  PLAN OF CARE: Discharge to home after PACU  PATIENT DISPOSITION:  PACU - hemodynamically stable.   Delay start of Pharmacological VTE agent (>24hrs) due to surgical blood loss or risk of bleeding: not applicable  Findings: cystocele to 0, rectocele to 0.  Bladder intact and ureters working after needles placed and after tape placed.  Meds:  Indigo carmine.  After general anesthesia was administered, the patient was prepped and draped in the usual sterile fashion.  The cuff was well suspended.  Vincent foley was placed in the urethra and the apex of the cystocele grasped with two allises.  Allises were used to grasp the vaginal mucosa in the midline superior to inferior just below the urethral opening.  The mucosa was injected with 1% lidocaine with epi in the midline and Vincent scalpel used to incise the vaginal mucosa vertically.  Allises were used to retract the vaginal mucosa as the vesico-vaginal fascia was removed from the mucosa with blunt and sharp dissection and adequate room midurethral to pelvic floor bilaterally was developed.  Two small puncture incisions were then made two cm from midline just above the pubic symphysis.  The abdominal needles from the Gynecare TVT set were introduced behind the pubic  bone, through the pelvic floor and out the vagina carefully on each respective side, being careful to hug the posterior of the pubic bone.  The foley was removed and indigo carmine given.  Cystoscopy revealed excellent bilateral spill from each ureteral orifice and NO NEEDLES IN THE BLADDER.   The urethra was clear as well.  The foley was replaced and the vaginal needles attached to the abdominal needles.  The tape was pulled into place through the abdominal incisions, the sheaths removed while Vincent kelly was in place under the mesh sling and the tape cut at the level of just below the skin.  The tape was checked and found to be tight enough and laying flat.  The cystoscope was replaced at this point because the ureteral orifaces were very low in the bladder on the first cystoscope.  Indigo carmine spilled at this point after the tape was in place from both orifaces and they looked completely normal. Gelfoam  was placed in the corners up by the pelvic floor to achieve hemostasis of some small bleeders out of reach and too close to the sling for stitches.  Once hemostasis was achieved, three mattress stitches of 0-vicryl were placed to close the vesico-vaginal fascia under the bladder.  The vaginal mucosa was trimmed and then closed with Vincent running lock stitch of 2-0 vicryl.   Attention was turned to the posterior vagina.  The perineum was normal and the posterior fourchette was normal caliber.  The posterior vagina was grasped with allises in the midline above the fourchette and the midline  injected with 1% lidocaine with epi.  The vaginal mucosa was incised in the midline to above the rectocele and then the mucosa was incised off the rectovaginal fascia laterally.  Two mattress stitches were placed in the fascia.  The vaginal caliber was noted at this point to be too narrow and the superior stitch was removed and replaced less laterally and this time the caliber was correct.  Gelfoam was placed under the vaginal  mucosa, the vaginal mucosa was trimmed and then closed with 2-0 vicryl in Vincent running stitch.     Vincent vaginal packing with estrace was placed and the patient returned to the recovery room in stable condition.      Diana Vincent

## 2012-07-07 NOTE — Progress Notes (Signed)
Pt is discharged  In the care of husband. Downstairs per  Wheelchair. Stable denies pain or discomfort. Pt was discharged with vaginal packing in place and foley catheter in place.  Instructed to visit Dr. Isidore Moos in am. For possible removal of both.Instructions were given on home care of foley. Questions asked and answered. Comperhended well.

## 2012-07-07 NOTE — Anesthesia Procedure Notes (Signed)
Procedure Name: LMA Insertion Date/Time: 07/07/2012 10:12 AM Performed by: Graciela Husbands Pre-anesthesia Checklist: Emergency Drugs available, Timeout performed, Suction available, Patient identified and Patient being monitored Patient Re-evaluated:Patient Re-evaluated prior to inductionOxygen Delivery Method: Circle system utilized Preoxygenation: Pre-oxygenation with 100% oxygen Intubation Type: IV induction Ventilation: Mask ventilation without difficulty LMA: LMA inserted LMA Size: 4.0 Number of attempts: 1 Placement Confirmation: positive ETCO2 and breath sounds checked- equal and bilateral Tube secured with: Tape Dental Injury: Teeth and Oropharynx as per pre-operative assessment

## 2012-07-07 NOTE — Anesthesia Postprocedure Evaluation (Signed)
Anesthesia Post Note  Patient: Diana Vincent  Procedure(s) Performed: Procedure(s) (LRB): TRANSVAGINAL TAPE (TVT) PROCEDURE (N/A) ANTERIOR (CYSTOCELE) AND POSTERIOR REPAIR (RECTOCELE) (N/A) CYSTOSCOPY ()  Anesthesia type: GA  Patient location: PACU  Post pain: Pain level controlled  Post assessment: Post-op Vital signs reviewed  Last Vitals:  Filed Vitals:   07/07/12 1210  BP:   Pulse: 97  Temp:   Resp:     Post vital signs: Reviewed  Level of consciousness: sedated  Complications: No apparent anesthesia complications

## 2012-07-07 NOTE — Op Note (Signed)
07/07/2012  11:18 AM  PATIENT:  Diana Vincent  56 y.o. female  PRE-OPERATIVE DIAGNOSIS:  Stress Urinary Incontinence; Cystocele and Rectocele  POST-OPERATIVE DIAGNOSIS:  Stress Urinary Incontinence; Cystocele; Rectocele  PROCEDURE:  Procedure(s) (LRB): TRANSVAGINAL TAPE (TVT) PROCEDURE (N/Vincent) ANTERIOR (CYSTOCELE) AND POSTERIOR REPAIR (RECTOCELE) (N/Vincent) CYSTOSCOPY ()  SURGEON:  Surgeon(s) and Role:    * Wendelyn Kiesling Vincent Donte Lenzo, MD - Primary    * W Scott Bowie, MD - Assisting  ANESTHESIA:   general  EBL:  Total I/O In: 1000 [I.V.:1000] Out: 200 [Urine:100; Blood:100]   LOCAL MEDICATIONS USED:  LIDOCAINE, gelfoam   SPECIMEN:  No Specimen  DISPOSITION OF SPECIMEN:  N/Vincent  COUNTS:  YES  PLAN OF CARE: Discharge to home after PACU  PATIENT DISPOSITION:  PACU - hemodynamically stable.   Delay start of Pharmacological VTE agent (>24hrs) due to surgical blood loss or risk of bleeding: not applicable  Findings: cystocele to 0, rectocele to 0.  Bladder intact and ureters working after needles placed and after tape placed.  Meds:  Indigo carmine.  After general anesthesia was administered, the patient was prepped and draped in the usual sterile fashion.  The cuff was well suspended.  Vincent foley was placed in the urethra and the apex of the cystocele grasped with two allises.  Allises were used to grasp the vaginal mucosa in the midline superior to inferior just below the urethral opening.  The mucosa was injected with 1% lidocaine with epi in the midline and Vincent scalpel used to incise the vaginal mucosa vertically.  Allises were used to retract the vaginal mucosa as the vesico-vaginal fascia was removed from the mucosa with blunt and sharp dissection and adequate room midurethral to pelvic floor bilaterally was developed.  Two small puncture incisions were then made two cm from midline just above the pubic symphysis.  The abdominal needles from the Gynecare TVT set were introduced behind the pubic  bone, through the pelvic floor and out the vagina carefully on each respective side, being careful to hug the posterior of the pubic bone.  The foley was removed and indigo carmine given.  Cystoscopy revealed excellent bilateral spill from each ureteral orifice and NO NEEDLES IN THE BLADDER.   The urethra was clear as well.  The foley was replaced and the vaginal needles attached to the abdominal needles.  The tape was pulled into place through the abdominal incisions, the sheaths removed while Vincent kelly was in place under the mesh sling and the tape cut at the level of just below the skin.  The tape was checked and found to be tight enough and laying flat.  The cystoscope was replaced at this point because the ureteral orifaces were very low in the bladder on the first cystoscope.  Indigo carmine spilled at this point after the tape was in place from both orifaces and they looked completely normal. Gelfoam  was placed in the corners up by the pelvic floor to achieve hemostasis of some small bleeders out of reach and too close to the sling for stitches.  Once hemostasis was achieved, three mattress stitches of 0-vicryl were placed to close the vesico-vaginal fascia under the bladder.  The vaginal mucosa was trimmed and then closed with Vincent running lock stitch of 2-0 vicryl.   Attention was turned to the posterior vagina.  The perineum was normal and the posterior fourchette was normal caliber.  The posterior vagina was grasped with allises in the midline above the fourchette and the midline   injected with 1% lidocaine with epi.  The vaginal mucosa was incised in the midline to above the rectocele and then the mucosa was incised off the rectovaginal fascia laterally.  Two mattress stitches were placed in the fascia.  The vaginal caliber was noted at this point to be too narrow and the superior stitch was removed and replaced less laterally and this time the caliber was correct.  Gelfoam was placed under the vaginal  mucosa, the vaginal mucosa was trimmed and then closed with 2-0 vicryl in Vincent running stitch.     Vincent vaginal packing with estrace was placed and the patient returned to the recovery room in stable condition.      Diana Vincent   

## 2012-07-07 NOTE — Progress Notes (Signed)
There has been no change in the patients history, status or exam since the history and physical.  149/94 T36.8 P95   Lab Results  Component Value Date   WBC 8.4 07/01/2012   HGB 15.8* 07/01/2012   HCT 47.8* 07/01/2012   MCV 94.8 07/01/2012   PLT 329 07/01/2012    Diana Vincent A

## 2012-07-08 ENCOUNTER — Encounter (HOSPITAL_COMMUNITY): Payer: Self-pay | Admitting: Obstetrics and Gynecology

## 2012-07-08 NOTE — Discharge Summary (Signed)
Physician Discharge Summary  Patient ID: Diana Vincent MRN: [id number] DOB/AGE: 1956/07/07 56 y.o.  Admit date: 07/07/2012 Discharge date: 07/08/2012  Admission Diagnoses:SUI, cystocele, rectocele  Discharge Diagnoses: same Active Problems:  * No active hospital problems. *    Discharged Condition: good  Hospital Course: uncomplicated, d/ced dos  Consults: None  Significant Diagnostic Studies: none  Treatments: surgery: TVT, A&P repair, cystoscopy  Discharge Exam: Blood pressure 121/80, pulse 106, temperature 98.1 F (36.7 C), temperature source Oral, resp. rate 18, height 5' 5.5" (1.664 m), weight 101.606 kg (224 lb), SpO2 97.00%.  Disposition: 01-Home or Self Care  Discharge Orders    Future Orders Please Complete By Expires   Diet - low sodium heart healthy      Discharge instructions      Comments:   No driving on narcotics, no sexual activity for 2 weeks.   Increase activity slowly      May shower / Bathe      Comments:   Shower, no bath for 2 weeks.   Sexual Activity Restrictions      Comments:   No sexual activity for 2 weeks.   Remove dressing in 24 hours      Call MD for:  temperature >100.4      Discharge instructions        Medication List  As of 07/08/2012 10:16 AM   TAKE these medications         AMBIEN 10 MG tablet   Generic drug: zolpidem   Take 5 mg by mouth at bedtime as needed.      buPROPion 150 MG 24 hr tablet   Commonly known as: WELLBUTRIN XL   Take 300 mg by mouth daily.      DULoxetine 60 MG capsule   Commonly known as: CYMBALTA   Take 60 mg by mouth daily.      estrogen-methylTESTOSTERone 1.25-2.5 MG per tablet   Commonly known as: ESTRATEST   Take 1 tablet by mouth daily.      fluticasone 50 MCG/ACT nasal spray   Commonly known as: FLONASE   Place 1-2 sprays into the nose at bedtime as needed. For allergies      loratadine 10 MG tablet   Commonly known as: CLARITIN   Take 10 mg by mouth daily.     oxyCODONE-acetaminophen 5-325 MG per tablet   Commonly known as: PERCOCET/ROXICET   Take 1 tablet by mouth every 4 (four) hours as needed for pain.      valsartan 160 MG tablet   Commonly known as: DIOVAN   Take 1 tablet (160 mg total) by mouth daily.      VESICARE 10 MG tablet   Generic drug: solifenacin   Take 10 mg by mouth daily.             Signed: Danniel Tones A 07/08/2012, 10:16 AM

## 2012-07-10 ENCOUNTER — Inpatient Hospital Stay (HOSPITAL_COMMUNITY)
Admission: AD | Admit: 2012-07-10 | Discharge: 2012-07-10 | Disposition: A | Discharge status: Home / Self Care | Payer: BC Managed Care – PPO | Source: Ambulatory Visit | Attending: Obstetrics and Gynecology | Admitting: Obstetrics and Gynecology

## 2012-07-10 ENCOUNTER — Encounter (HOSPITAL_COMMUNITY): Payer: Self-pay | Admitting: Obstetrics and Gynecology

## 2012-07-10 DIAGNOSIS — IMO0002 Reserved for concepts with insufficient information to code with codable children: Secondary | ICD-10-CM | POA: Insufficient documentation

## 2012-07-10 DIAGNOSIS — R339 Retention of urine, unspecified: Secondary | ICD-10-CM | POA: Insufficient documentation

## 2012-07-10 DIAGNOSIS — N9989 Other postprocedural complications and disorders of genitourinary system: Secondary | ICD-10-CM

## 2012-07-10 DIAGNOSIS — Y838 Other surgical procedures as the cause of abnormal reaction of the patient, or of later complication, without mention of misadventure at the time of the procedure: Secondary | ICD-10-CM | POA: Insufficient documentation

## 2012-07-10 LAB — URINALYSIS, ROUTINE W REFLEX MICROSCOPIC
Ketones, ur: NEGATIVE mg/dL
Leukocytes, UA: NEGATIVE
Nitrite: NEGATIVE
Protein, ur: NEGATIVE mg/dL
Urobilinogen, UA: 0.2 mg/dL (ref 0.0–1.0)

## 2012-07-10 MED ORDER — NITROFURANTOIN MACROCRYSTAL 50 MG PO CAPS: 50.0000 mg | ORAL_CAPSULE | Freq: Every day | ORAL | Status: AC

## 2012-07-10 NOTE — MAU Note (Signed)
Patient states had bladder tacking surgery on Wednesday, catheter removed yesterday, having bladders spasms today urinary retention.

## 2012-07-10 NOTE — MAU Note (Signed)
Patient attempted to urine and was unsuccessful.

## 2012-07-10 NOTE — MAU Provider Note (Signed)
Diana Vincent y.o. Chief Complaint  Patient presents with  . Urinary Retention     First Provider Initiated Contact with Patient 07/10/12 1152      SUBJECTIVE  HPI:HPI: Diana Vincent is a 56 y.o. year old female who presents to MAU reporting having bladders spasms today and urinary retention, 3 days S/P  bladder tack surgery. Catheter removed yesterday. Has only been able to void 1/4 cup urine a few times since D/C foley. Denies dysuria, fever, chills, flank pain.   Past Medical History  Diagnosis Date  . Depression   . Hypertension   . Allergic rhinitis   . Head injury without skull fracture     no LOC  . Mandible fracture June 2013    hairline fx from fall out of swing-    Past Surgical History  Procedure Date  . Abdominal hysterectomy 1990s  . Cholecystectomy   . Bladder suspension 07/07/2012    Procedure: TRANSVAGINAL TAPE (TVT) PROCEDURE;  Surgeon: Loney Laurence, MD;  Location: [facility];  Service: Gynecology;  Laterality: N/A;  . Anterior and posterior repair 07/07/2012    Procedure: ANTERIOR (CYSTOCELE) AND POSTERIOR REPAIR (RECTOCELE);  Surgeon: Loney Laurence, MD;  Location: [facility];  Service: Gynecology;  Laterality: N/A;  . Cystoscopy 07/07/2012    Procedure: CYSTOSCOPY;  Surgeon: Loney Laurence, MD;  Location: [facility];  Service: Gynecology;;   History   Social History  . Marital Status: Married    Spouse Name: N/A    Number of Children: N/A  . Years of Education: N/A   Occupational History  . Not on file.   Social History Main Topics  . Smoking status: Never Smoker   . Smokeless tobacco: Not on file  . Alcohol Use: Yes     5 per week  . Drug Use: No  . Sexually Active:    Other Topics Concern  . Not on file   Social History Narrative  . No narrative on file   No current facility-administered medications on file prior to encounter.   Current Outpatient Prescriptions on File Prior to Encounter  Medication Sig Dispense Refill  . buPROPion  (WELLBUTRIN XL) 150 MG 24 hr tablet Take 300 mg by mouth daily.       . DULoxetine (CYMBALTA) 60 MG capsule Take 60 mg by mouth daily.      Marland Kitchen estrogen-methylTESTOSTERone (ESTRATEST) 1.25-2.5 MG per tablet Take 1 tablet by mouth daily.      . fluticasone (FLONASE) 50 MCG/ACT nasal spray Place 1-2 sprays into the nose at bedtime as needed. For allergies      . loratadine (CLARITIN) 10 MG tablet Take 10 mg by mouth daily.      Marland Kitchen oxyCODONE-acetaminophen (ROXICET) 5-325 MG per tablet Take 1 tablet by mouth every 4 (four) hours as needed for pain.  30 tablet  0  . valsartan (DIOVAN) 160 MG tablet Take 1 tablet (160 mg total) by mouth daily.  30 tablet  3  . VESICARE 10 MG tablet Take 10 mg by mouth daily.       Marland Kitchen zolpidem (AMBIEN) 10 MG tablet Take 5 mg by mouth at bedtime as needed.        No Active Allergies  ROS: Pertinent items in HPI  OBJECTIVE Blood pressure 140/86, pulse 93, temperature 97.4 F (36.3 C), temperature source Oral, resp. rate 18, height 5\' 5"  (1.651 m), weight 102.331 kg (225 lb 9.6 oz).  GENERAL: Well-developed, well-nourished female in mild distress.  HEENT: Normocephalic, good dentition HEART: normal rate RESP: normal effort ABDOMEN: Soft, mildly tender, ?bladder palpated. Pt unable to void.  EXTREMITIES: Nontender, no edema NEURO: Alert and oriented SPECULUM EXAM: deferred  LAB RESULTS Recent Results (from the past 168 hour(s))  URINALYSIS, ROUTINE W REFLEX MICROSCOPIC   Collection Time   07/10/12 12:34 PM      Component Value Range   Color, Urine YELLOW  YELLOW   APPearance CLEAR  CLEAR   Specific Gravity, Urine <1.005 (*) 1.005 - 1.030   pH 7.0  5.0 - 8.0   Glucose, UA NEGATIVE  NEGATIVE mg/dL   Hgb urine dipstick NEGATIVE  NEGATIVE   Bilirubin Urine NEGATIVE  NEGATIVE   Ketones, ur NEGATIVE  NEGATIVE mg/dL   Protein, ur NEGATIVE  NEGATIVE mg/dL   Urobilinogen, UA 0.2  0.0 - 1.0 mg/dL   Nitrite NEGATIVE  NEGATIVE   Leukocytes, UA NEGATIVE  NEGATIVE    IMAGING Per bladder scanner ~1000cc urine in bladder.  ED COURSE: Foley cath placed per Dr.Ross. Drained ~1000 cc urine. UA sent.  ASSESSMENT 1. Postoperative Retention Of Urine    PLAN D/C home Macrobid for UTI prophylaxis.  Follow-up Information    Follow up with HORVATH,MICHELLE A, MD on 07/13/2012. (or MAU as needed if symptoms worsen)    Contact information:   [address] [place] [address] [phone]         Medication List  As of 07/12/2012  1:01 AM   TAKE these medications         AMBIEN 10 MG tablet   Generic drug: zolpidem   Take 5 mg by mouth at bedtime as needed.      buPROPion 150 MG 24 hr tablet   Commonly known as: WELLBUTRIN XL   Take 300 mg by mouth daily.      DULoxetine 60 MG capsule   Commonly known as: CYMBALTA   Take 60 mg by mouth daily.      estrogen-methylTESTOSTERone 1.25-2.5 MG per tablet   Commonly known as: ESTRATEST   Take 1 tablet by mouth daily.      fluticasone 50 MCG/ACT nasal spray   Commonly known as: FLONASE   Place 1-2 sprays into the nose at bedtime as needed. For allergies      loratadine 10 MG tablet   Commonly known as: CLARITIN   Take 10 mg by mouth daily.      nitrofurantoin 50 MG capsule   Commonly known as: MACRODANTIN   Take 1 capsule (50 mg total) by mouth daily.      oxyCODONE-acetaminophen 5-325 MG per tablet   Commonly known as: PERCOCET/ROXICET   Take 1 tablet by mouth every 4 (four) hours as needed for pain.      valsartan 160 MG tablet   Commonly known as: DIOVAN   Take 1 tablet (160 mg total) by mouth daily.      VESICARE 10 MG tablet   Generic drug: solifenacin   Take 10 mg by mouth daily.           Diana Vincent 07/10/2012 12:24 PM

## 2012-07-13 NOTE — Progress Notes (Signed)
Post discharge UR review completed. 

## 2012-08-02 ENCOUNTER — Encounter (HOSPITAL_COMMUNITY): Payer: Self-pay | Admitting: Emergency Medicine

## 2012-08-02 ENCOUNTER — Encounter: Payer: Self-pay | Admitting: Family Medicine

## 2012-08-02 ENCOUNTER — Emergency Department (HOSPITAL_COMMUNITY): Payer: BC Managed Care – PPO

## 2012-08-02 ENCOUNTER — Ambulatory Visit (INDEPENDENT_AMBULATORY_CARE_PROVIDER_SITE_OTHER): Payer: BC Managed Care – PPO | Admitting: Family Medicine

## 2012-08-02 ENCOUNTER — Emergency Department (HOSPITAL_COMMUNITY)
Admission: EM | Admit: 2012-08-02 | Discharge: 2012-08-02 | Disposition: A | Discharge status: Home / Self Care | Payer: BC Managed Care – PPO | Attending: Emergency Medicine | Admitting: Emergency Medicine

## 2012-08-02 VITALS — BP 114/62 | HR 72 | Temp 97.8°F | Wt 228.0 lb

## 2012-08-02 DIAGNOSIS — R079 Chest pain, unspecified: Secondary | ICD-10-CM

## 2012-08-02 DIAGNOSIS — I10 Essential (primary) hypertension: Secondary | ICD-10-CM | POA: Insufficient documentation

## 2012-08-02 DIAGNOSIS — R071 Chest pain on breathing: Secondary | ICD-10-CM

## 2012-08-02 DIAGNOSIS — R0602 Shortness of breath: Secondary | ICD-10-CM | POA: Insufficient documentation

## 2012-08-02 DIAGNOSIS — Z79899 Other long term (current) drug therapy: Secondary | ICD-10-CM | POA: Insufficient documentation

## 2012-08-02 DIAGNOSIS — R0781 Pleurodynia: Secondary | ICD-10-CM | POA: Insufficient documentation

## 2012-08-02 DIAGNOSIS — F3289 Other specified depressive episodes: Secondary | ICD-10-CM | POA: Insufficient documentation

## 2012-08-02 DIAGNOSIS — R791 Abnormal coagulation profile: Secondary | ICD-10-CM | POA: Insufficient documentation

## 2012-08-02 DIAGNOSIS — F329 Major depressive disorder, single episode, unspecified: Secondary | ICD-10-CM | POA: Insufficient documentation

## 2012-08-02 LAB — BASIC METABOLIC PANEL
CO2: 26 mEq/L (ref 19–32)
Calcium: 9.2 mg/dL (ref 8.4–10.5)
Chloride: 104 mEq/L (ref 96–112)
Creatinine, Ser: 0.65 mg/dL (ref 0.50–1.10)
Glucose, Bld: 102 mg/dL — ABNORMAL HIGH (ref 70–99)

## 2012-08-02 LAB — CBC
Hemoglobin: 14.4 g/dL (ref 12.0–15.0)
MCH: 31.9 pg (ref 26.0–34.0)
MCV: 95.6 fL (ref 78.0–100.0)
RBC: 4.51 MIL/uL (ref 3.87–5.11)
WBC: 14.7 10*3/uL — ABNORMAL HIGH (ref 4.0–10.5)

## 2012-08-02 LAB — TROPONIN I: Troponin I: <0.3 ng/mL (ref <0.30)

## 2012-08-02 LAB — D-DIMER, QUANTITATIVE: D-Dimer, Quant: 0.71 ug/mL-FEU — ABNORMAL HIGH (ref 0.00–0.48)

## 2012-08-02 MED ORDER — IOHEXOL 350 MG/ML SOLN
100.0000 mL | Freq: Once | INTRAVENOUS | Status: AC | PRN
  Administered 2012-08-02: 100 mL via INTRAVENOUS

## 2012-08-02 MED ORDER — IBUPROFEN 600 MG PO TABS: 600.0000 mg | ORAL_TABLET | Freq: Three times a day (TID) | ORAL | Status: AC | PRN

## 2012-08-02 MED ORDER — OXYCODONE-ACETAMINOPHEN 5-325 MG PO TABS
1.0000 | ORAL_TABLET | Freq: Once | ORAL | Status: AC
  Administered 2012-08-02: 1 via ORAL
  Filled 2012-08-02: qty 1

## 2012-08-02 MED ORDER — MORPHINE SULFATE 4 MG/ML IJ SOLN
4.0000 mg | Freq: Once | INTRAMUSCULAR | Status: DC
  Filled 2012-08-02: qty 1

## 2012-08-02 NOTE — ED Notes (Signed)
Patient transported to X-ray 

## 2012-08-02 NOTE — ED Notes (Signed)
Pt came from PCP off [address]. Pt started to have chest pain this morning and made a  Emergency appt with PCP. PCP decided for patient to be seen at the ER due to concerns with PE and any other cardiac issues. Pain at current time is a 4/10. Radiates to back of neck.  PCP administered 4 ASA to patient.

## 2012-08-02 NOTE — Assessment & Plan Note (Signed)
New.  EKG w/out obvious cardiac cause.  Given that pt had acute onset this AM while at work after period of relative immobility, is on HRT, and is short of breath, primary concern is for PE.  Due to pt's obvious discomfort and ongoing CP despite 4 81mg  ASA in office, EMS called for transport to ER for complete w/u.  Pt expressed understanding and is in agreement w/ plan.

## 2012-08-02 NOTE — Progress Notes (Signed)
  Subjective:    Patient ID: Diana Vincent, female    DOB: [birth year], 56 y.o.   MRN: [id number]  HPI Woke up this AM, 'feeling fine'.  Took dogs for walk- no problem.  While at work began having trouble breathing- painful to breathe, difficult to catch breath.  No coughing.  Pain is across anterior chest- occurs w/ expansion of lungs.  Had bladder tack the end of July, has been fairly immobile.  On Estrogen.  No redness or swelling of legs.  Denies N/V/D.   Review of Systems For ROS see HPI     Objective:   Physical Exam  Vitals reviewed. Constitutional: She is oriented to person, place, and time. She appears well-developed and well-nourished. She appears distressed (obviously uncomfortable).  HENT:  Head: Normocephalic and atraumatic.  Eyes: Conjunctivae and EOM are normal. Pupils are equal, round, and reactive to light.  Neck: Normal range of motion. Neck supple. No thyromegaly present.  Cardiovascular: Normal rate, regular rhythm, normal heart sounds and intact distal pulses.   No murmur heard. Pulmonary/Chest: Breath sounds normal. No respiratory distress (not struggling for air but not breathing easily). She has no wheezes. She has no rales. She exhibits tenderness (over anterior chest wall).  Abdominal: Soft. She exhibits no distension. There is no tenderness.  Musculoskeletal: She exhibits no edema and no tenderness.  Lymphadenopathy:    She has no cervical adenopathy.  Neurological: She is alert and oriented to person, place, and time.  Skin: Skin is warm and dry.  Psychiatric: She has a normal mood and affect. Her behavior is normal.          Assessment & Plan:

## 2012-08-02 NOTE — ED Provider Notes (Signed)
History     CSN: [id number]  Arrival date & time 08/02/12  1213   First MD Initiated Contact with Patient 08/02/12 1232      Chief Complaint  Patient presents with  . Chest Pain  . Shortness of Breath     The history is provided by the patient.   patient reports she went to work this afternoon and developed pleuritic chest pain shortness of breath.  She has no prior history of pulmonary and wasn't a DVT.  She was seen by her primary care physician who recommended she be seen in the emergency department for evaluation for possible pulmonary embolism.  She reports her discomfort in her chest is worse when she takes a deep breath.  She's had no recent fevers or chills.  She denies productive cough.  She has no unilateral leg swelling.  She did have bladder surgery approximately a month ago.  There is a family history of DVT in her father who had a traumatic brain injury.  She has no prior history of coronary artery disease.  She does have a history of hypertension.  She does not smoke  Past Medical History  Diagnosis Date  . Depression   . Hypertension   . Allergic rhinitis   . Head injury without skull fracture     no LOC  . Mandible fracture June 2013    hairline fx from fall out of swing-     Past Surgical History  Procedure Date  . Abdominal hysterectomy 1990s  . Cholecystectomy   . Bladder suspension 07/07/2012    Procedure: TRANSVAGINAL TAPE (TVT) PROCEDURE;  Surgeon: Loney Laurence, MD;  Location: [facility];  Service: Gynecology;  Laterality: N/A;  . Anterior and posterior repair 07/07/2012    Procedure: ANTERIOR (CYSTOCELE) AND POSTERIOR REPAIR (RECTOCELE);  Surgeon: Loney Laurence, MD;  Location: [facility];  Service: Gynecology;  Laterality: N/A;  . Cystoscopy 07/07/2012    Procedure: CYSTOSCOPY;  Surgeon: Loney Laurence, MD;  Location: [facility];  Service: Gynecology;;    Family History  Problem Relation Age of Onset  . Hypertension      History  Substance Use  Topics  . Smoking status: Never Smoker   . Smokeless tobacco: Not on file  . Alcohol Use: Yes     5 per week    OB History    Grav Para Term Preterm Abortions TAB SAB Ect Mult Living                  Review of Systems  All other systems reviewed and are negative.    Allergies  Review of patient's allergies indicates no known allergies.  Home Medications   Current Outpatient Rx  Name Route Sig Dispense Refill  . ARIPIPRAZOLE 2 MG PO TABS Oral Take 1 mg by mouth daily.    . BUPROPION HCL ER (XL) 150 MG PO TB24 Oral Take 75 mg by mouth daily.     . DULOXETINE HCL 60 MG PO CPEP Oral Take 60 mg by mouth daily.    Marland Kitchen EST ESTROGENS-METHYLTEST 1.25-2.5 MG PO TABS Oral Take 1 tablet by mouth daily.    Marland Kitchen FLUTICASONE PROPIONATE 50 MCG/ACT NA SUSP Nasal Place 1-2 sprays into the nose at bedtime as needed. For allergies    . LORATADINE 10 MG PO TABS Oral Take 10 mg by mouth daily.    Marland Kitchen VALSARTAN 160 MG PO TABS Oral Take 1 tablet (160 mg total) by mouth daily. 30 tablet  3  . ZOLPIDEM TARTRATE 10 MG PO TABS Oral Take 5 mg by mouth at bedtime as needed. For sleep    . IBUPROFEN 600 MG PO TABS Oral Take 1 tablet (600 mg total) by mouth every 8 (eight) hours as needed for pain. 15 tablet 0    BP 127/65  Pulse 109  Temp 98.3 F (36.8 C) (Oral)  Resp 29  SpO2 98%  Physical Exam  Nursing note and vitals reviewed. Constitutional: She is oriented to person, place, and time. She appears well-developed and well-nourished. No distress.  HENT:  Head: Normocephalic and atraumatic.  Eyes: EOM are normal.  Neck: Normal range of motion.  Cardiovascular: Normal rate, regular rhythm and normal heart sounds.   Pulmonary/Chest: Effort normal and breath sounds normal.  Abdominal: Soft. She exhibits no distension. There is no tenderness.  Musculoskeletal: Normal range of motion.  Neurological: She is alert and oriented to person, place, and time.  Skin: Skin is warm and dry.  Psychiatric: She has a  normal mood and affect. Judgment normal.    ED Course  Procedures (including critical care time)  Labs Reviewed  CBC - Abnormal; Notable for the following:    WBC 14.7 (*)     All other components within normal limits  BASIC METABOLIC PANEL - Abnormal; Notable for the following:    Glucose, Bld 102 (*)     All other components within normal limits  D-DIMER, QUANTITATIVE - Abnormal; Notable for the following:    D-Dimer, Quant 0.71 (*)     All other components within normal limits  TROPONIN I   Dg Chest 2 View  08/02/2012  *RADIOLOGY REPORT*  Clinical Data: Chest pain, shortness of breath.  CHEST - 2 VIEW  Comparison: Chest CT 04/28/2012  Findings: Airspace disease at the left lung base, question pneumonia.  Minimal density in the right base, atelectasis versus infiltrate.  Heart is normal size.  No effusions.  Degenerative changes in the thoracic spine.  IMPRESSION: Bibasilar opacities, left greater than right, atelectasis versus pneumonia.   Original Report Authenticated By: Cyndie Chime, M.D.    Ct Angio Chest W/cm &/or Wo Cm  08/02/2012  *RADIOLOGY REPORT*  Clinical Data: Shortness breath.  Chest pain.  Postop from bladder surgery several weeks ago.  CT ANGIOGRAPHY CHEST  Technique:  Multidetector CT imaging of the chest using the standard protocol during bolus administration of intravenous contrast. Multiplanar reconstructed images including MIPs were obtained and reviewed to evaluate the vascular anatomy.  Contrast: OMNIPAQUE IOHEXOL 350 MG/ML SOLN  Comparison: Noncontrast chest CT on 04/28/2012  Findings: Satisfactory opacification of the pulmonary arteries noted, and there is no evidence of pulmonary emboli.  No evidence of thoracic aortic aneurysm or dissection.  No evidence of mediastinal hematoma or mass.  No adenopathy seen elsewhere within the thorax.  Low lung volumes seen with mild bibasilar atelectasis.  No evidence of pulmonary consolidation.  No suspicious pulmonary  nodules or masses identified.  No central endobronchial lesion identified.  No evidence of pleural or pericardial effusion.  IMPRESSION:  1.  No evidence of acute pulmonary embolism. 2.  Low lung volumes with bibasilar atelectasis.   Original Report Authenticated By: Danae Orleans, M.D.     Date: 08/02/2012  Rate: 109  Rhythm: normal sinus rhythm  QRS Axis: normal  Intervals: normal  ST/T Wave abnormalities: normal  Conduction Disutrbances: none  Narrative Interpretation:   Old EKG Reviewed: No significant changes noted     1.  Chest pain       MDM  The patient's d-dimer is mildly elevated.  CT angiography obtained.  No evidence of pulmonary embolism.  EKG is normal.  Troponin is normal.  The patient has no hypoxia.  She feels much better at this time.  Ever-present pleurisy.      Lyanne Co, MD 08/02/12 [phone]

## 2012-08-17 ENCOUNTER — Telehealth: Payer: Self-pay | Admitting: Family Medicine

## 2012-08-17 MED ORDER — VALSARTAN 160 MG PO TABS: 160.0000 mg | ORAL_TABLET | Freq: Every day | ORAL | Status: DC

## 2012-08-17 NOTE — Telephone Encounter (Signed)
Refill: Diovan 160mg  tablets. Take 1 tablet by mouth daily. Qty 30. Last fill 07-18-12

## 2012-08-17 NOTE — Telephone Encounter (Signed)
rx sent to pharmacy by e-script  

## 2012-09-01 ENCOUNTER — Ambulatory Visit (INDEPENDENT_AMBULATORY_CARE_PROVIDER_SITE_OTHER): Payer: BC Managed Care – PPO | Admitting: Family Medicine

## 2012-09-01 ENCOUNTER — Encounter: Payer: Self-pay | Admitting: Family Medicine

## 2012-09-01 VITALS — BP 132/82 | HR 95 | Temp 98.4°F | Ht 64.25 in | Wt 225.0 lb

## 2012-09-01 DIAGNOSIS — M791 Myalgia, unspecified site: Secondary | ICD-10-CM

## 2012-09-01 DIAGNOSIS — R5383 Other fatigue: Secondary | ICD-10-CM

## 2012-09-01 DIAGNOSIS — IMO0001 Reserved for inherently not codable concepts without codable children: Secondary | ICD-10-CM

## 2012-09-01 DIAGNOSIS — R5381 Other malaise: Secondary | ICD-10-CM

## 2012-09-01 DIAGNOSIS — R0602 Shortness of breath: Secondary | ICD-10-CM

## 2012-09-01 NOTE — Progress Notes (Signed)
  Subjective:    Patient ID: Diana Vincent, female    DOB: 04/03/1956, 56 y.o.   MRN: [id number]  HPI Fatigue- 'exhausted all the time', 'achey, i can't get comfortable'.  1 month ago, had SOB and went to ER to r/o DVT.  Had negative w/u.  Pt is frustrated w/ lack of answers.  Taking Cymbalta 60mg  which is lower than previous.  sxs started over 1 month ago.  Pt is worries about pre-diabetes.  Pt feels worse, more lethargic when she has 'high carb days'.  Aching is described as more muscle than bone.  Pt reports she is better this week than last week, 'but it's not good'.  No longer having chest pain.  +SOB w/ exertion.  Cousin w/ heart transplant at age 31 for genetic condition.  Some swelling of feet and hands.  Pt reports face feels swollen.     Review of Systems For ROS see HPI     Objective:   Physical Exam  Vitals reviewed. Constitutional: She is oriented to person, place, and time. She appears well-developed and well-nourished. No distress.  HENT:  Head: Normocephalic and atraumatic.  Eyes: Conjunctivae normal and EOM are normal. Pupils are equal, round, and reactive to light.  Neck: Normal range of motion. Neck supple. No thyromegaly present.  Cardiovascular: Normal rate, regular rhythm, normal heart sounds and intact distal pulses.   No murmur heard. Pulmonary/Chest: Effort normal and breath sounds normal. No respiratory distress.  Abdominal: Soft. She exhibits no distension. There is no tenderness.  Musculoskeletal: She exhibits no edema.  Lymphadenopathy:    She has no cervical adenopathy.  Neurological: She is alert and oriented to person, place, and time.  Skin: Skin is warm and dry.  Psychiatric: She has a normal mood and affect. Her behavior is normal.          Assessment & Plan:

## 2012-09-01 NOTE — Patient Instructions (Addendum)
We'll notify you of your lab results to determine the next steps We'll call you with your cardiology appt Call with any questions or concerns We will get to the bottom of this!! Hang in there!!!

## 2012-09-02 ENCOUNTER — Encounter: Payer: Self-pay | Admitting: *Deleted

## 2012-09-02 LAB — BASIC METABOLIC PANEL
BUN: 16 mg/dL (ref 6–23)
Calcium: 8.7 mg/dL (ref 8.4–10.5)
GFR: 83.74 mL/min (ref >60.00)
Potassium: 3.9 mEq/L (ref 3.5–5.1)

## 2012-09-02 LAB — HEMOGLOBIN A1C: Hgb A1c MFr Bld: 5 % (ref 4.6–6.5)

## 2012-09-02 LAB — CBC WITH DIFFERENTIAL/PLATELET
Basophils Relative: 0.8 % (ref 0.0–3.0)
Eosinophils Absolute: 0.2 10*3/uL (ref 0.0–0.7)
MCHC: 33.2 g/dL (ref 30.0–36.0)
MCV: 94 fl (ref 78.0–100.0)
Monocytes Absolute: 0.8 10*3/uL (ref 0.1–1.0)
Neutrophils Relative %: 68.7 % (ref 43.0–77.0)
Platelets: 325 10*3/uL (ref 150.0–400.0)
RBC: 4.71 Mil/uL (ref 3.87–5.11)

## 2012-09-02 LAB — SEDIMENTATION RATE: Sed Rate: 13 mm/hr (ref 0–22)

## 2012-09-02 LAB — ANA: Anti Nuclear Antibody(ANA): NEGATIVE

## 2012-09-02 LAB — HEPATIC FUNCTION PANEL
AST: 30 U/L (ref 0–37)
Total Bilirubin: 0.7 mg/dL (ref 0.3–1.2)

## 2012-09-02 LAB — TSH: TSH: 1.5 u[IU]/mL (ref 0.35–5.50)

## 2012-09-02 LAB — CK: Total CK: 56 U/L (ref 7–177)

## 2012-09-07 NOTE — Assessment & Plan Note (Signed)
Recurrent issue w/out obvious cause.  Check labs to r/o metabolic, rheumatologic, or other issue.  Pt's Cymbalta was recently lowered and this may be a factor in pt's current sxs.  Will continue to follow closely.

## 2012-09-07 NOTE — Assessment & Plan Note (Signed)
New.  Check labs to r/o rheumatologic cause.  Suspect possible fibromyalgia as pt has fatigue, depression, and multiple muscle aches.  May need to increase her dose of cymbalta back to previous.  Will follow.

## 2012-09-07 NOTE — Assessment & Plan Note (Signed)
New.  No evidence of heart failure but given report of genetic heart condition in the family will refer to cards for complete evaluation.  Pt expressed understanding and is in agreement w/ plan.

## 2012-09-08 ENCOUNTER — Ambulatory Visit: Payer: BC Managed Care – PPO | Admitting: Cardiovascular Disease

## 2012-10-07 ENCOUNTER — Ambulatory Visit (INDEPENDENT_AMBULATORY_CARE_PROVIDER_SITE_OTHER): Payer: BC Managed Care – PPO | Admitting: Cardiovascular Disease

## 2012-10-07 ENCOUNTER — Encounter: Payer: Self-pay | Admitting: Cardiovascular Disease

## 2012-10-07 VITALS — BP 130/88 | HR 81 | Wt 230.0 lb

## 2012-10-07 DIAGNOSIS — R0989 Other specified symptoms and signs involving the circulatory and respiratory systems: Secondary | ICD-10-CM

## 2012-10-07 DIAGNOSIS — R0609 Other forms of dyspnea: Secondary | ICD-10-CM

## 2012-10-07 DIAGNOSIS — I10 Essential (primary) hypertension: Secondary | ICD-10-CM

## 2012-10-07 DIAGNOSIS — R0600 Dyspnea, unspecified: Secondary | ICD-10-CM

## 2012-10-07 NOTE — Patient Instructions (Signed)
Your physician recommends that you schedule a follow-up appointment in:  AS NEEDED Your physician recommends that you continue on your current medications as directed. Please refer to the Current Medication list given to you today.  Your physician has requested that you have an echocardiogram. Echocardiography is a painless test that uses sound waves to create images of your heart. It provides your doctor with information about the size and shape of your heart and how well your heart's chambers and valves are working. This procedure takes approximately one hour. There are no restrictions for this procedure. DX  DYSPNEA 

## 2012-10-07 NOTE — Assessment & Plan Note (Signed)
Functional Good w/u by primary with no evidence of PE, hypothyroidism, anemia or connective tissue disease.  F/U echo to assess LV and RV function r/o pulmonary hypertension

## 2012-10-07 NOTE — Progress Notes (Signed)
Patient ID: Diana Vincent, female   DOB: 09/01/1956, 56 y.o.   MRN: [id number] 56 yo referred by Tabori for malaise, fatigue and dyspnea.  Lab work recently done and reviewed Normal TSH, ESR not anemic and no signs of connective tissue disease CTA done 8/13 with no CE and no PE and just atelectasis  Dyspnea started at end of September after a bladder tach.  Some improvement over last few weeks.  No cough sputum Chest pain. No LE edema.  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  Nonsmoker She has one child that has Asperberers age 58 but she is doing well.    ROS: Denies fever, malais, weight loss, blurry vision, decreased visual acuity, cough, sputum, SOB, hemoptysis, pleuritic pain, palpitaitons, heartburn, abdominal pain, melena, lower extremity edema, claudication, or rash.  All other systems reviewed and negative   General: Affect appropriate Overweight white female HEENT: normal Neck supple with no adenopathy JVP normal no bruits no thyromegaly Lungs clear with no wheezing and good diaphragmatic motion Heart:  S1/S2 no murmur,rub, gallop or click PMI normal Abdomen: benighn, BS positve, no tenderness, no AAA no bruit.  No HSM or HJR Distal pulses intact with no bruits No edema Neuro non-focal Skin warm and dry No muscular weakness  Medications Current Outpatient Prescriptions  Medication Sig Dispense Refill  . DULoxetine (CYMBALTA) 60 MG capsule Take 60 mg by mouth daily.      Marland Kitchen estrogen-methylTESTOSTERone (ESTRATEST) 1.25-2.5 MG per tablet Take 1 tablet by mouth daily.      . fluticasone (FLONASE) 50 MCG/ACT nasal spray Place 1-2 sprays into the nose at bedtime as needed. For allergies      . LORazepam (ATIVAN) 0.5 MG tablet       . valsartan (DIOVAN) 160 MG tablet Take 1 tablet (160 mg total) by mouth daily.  30 tablet  3  . zolpidem (AMBIEN) 10 MG tablet Take 5 mg by mouth at bedtime as needed. For sleep        Allergies Review of patient's allergies indicates no known  allergies.  Family History: Family History  Problem Relation Age of Onset  . Hypertension      Social History: History   Social History  . Marital Status: Married    Spouse Name: N/A    Number of Children: N/A  . Years of Education: N/A   Occupational History  . Not on file.   Social History Main Topics  . Smoking status: Never Smoker   . Smokeless tobacco: Not on file  . Alcohol Use: Yes     5 per week  . Drug Use: No  . Sexually Active:    Other Topics Concern  . Not on file   Social History Narrative  . No narrative on file    Electrocardiogram:  SR rate 109 nonspecific ST/T wave changes  Assessment and Plan

## 2012-10-07 NOTE — Assessment & Plan Note (Signed)
Well controlled.  Continue current medications and low sodium Dash type diet.    

## 2012-10-13 ENCOUNTER — Ambulatory Visit (HOSPITAL_COMMUNITY): Payer: BC Managed Care – PPO | Attending: Cardiology | Admitting: Radiology

## 2012-10-13 DIAGNOSIS — R0609 Other forms of dyspnea: Secondary | ICD-10-CM | POA: Insufficient documentation

## 2012-10-13 DIAGNOSIS — I10 Essential (primary) hypertension: Secondary | ICD-10-CM | POA: Insufficient documentation

## 2012-10-13 DIAGNOSIS — R0989 Other specified symptoms and signs involving the circulatory and respiratory systems: Secondary | ICD-10-CM | POA: Insufficient documentation

## 2012-10-13 DIAGNOSIS — R0600 Dyspnea, unspecified: Secondary | ICD-10-CM

## 2012-10-13 NOTE — Progress Notes (Signed)
Echocardiogram performed.  

## 2012-10-15 ENCOUNTER — Telehealth: Payer: Self-pay | Admitting: Cardiovascular Disease

## 2012-10-15 NOTE — Telephone Encounter (Signed)
Echo results were given to pt. 

## 2012-10-15 NOTE — Telephone Encounter (Signed)
New Problem: ° ° ° °Patient returned your call.  Please call back. °

## 2012-12-16 ENCOUNTER — Other Ambulatory Visit: Payer: Self-pay | Admitting: *Deleted

## 2012-12-16 MED ORDER — VALSARTAN 160 MG PO TABS: 160.0000 mg | ORAL_TABLET | Freq: Every day | ORAL | Status: DC

## 2012-12-16 NOTE — Telephone Encounter (Signed)
Rx sent, Pt aware. 

## 2013-04-15 ENCOUNTER — Other Ambulatory Visit: Payer: Self-pay | Admitting: General Practice

## 2013-04-15 MED ORDER — VALSARTAN 160 MG PO TABS: 160.0000 mg | ORAL_TABLET | Freq: Every day | ORAL | Status: DC

## 2013-04-27 ENCOUNTER — Ambulatory Visit (INDEPENDENT_AMBULATORY_CARE_PROVIDER_SITE_OTHER): Payer: BC Managed Care – PPO | Admitting: Family Medicine

## 2013-04-27 ENCOUNTER — Encounter: Payer: Self-pay | Admitting: Family Medicine

## 2013-04-27 VITALS — BP 136/80 | HR 83 | Temp 98.1°F | Ht 65.0 in | Wt 229.0 lb

## 2013-04-27 DIAGNOSIS — I10 Essential (primary) hypertension: Secondary | ICD-10-CM

## 2013-04-27 MED ORDER — VALSARTAN 160 MG PO TABS: 160.0000 mg | ORAL_TABLET | Freq: Every day | ORAL | Status: DC

## 2013-04-27 NOTE — Addendum Note (Signed)
Addended by: Sheliah Hatch on: 04/27/2013 04:37 PM   Modules accepted: Orders

## 2013-04-27 NOTE — Progress Notes (Signed)
  Subjective:    Patient ID: Diana Vincent, female    DOB: 1956-11-22, 57 y.o.   MRN: [id number]  HPI HTN- chronic problem, adequate control.  On Diovan.  No CP, SOB, HAs, visual changes, edema, N/V/D.   Review of Systems For ROS see HPI     Objective:   Physical Exam  Vitals reviewed. Constitutional: She is oriented to person, place, and time. She appears well-developed and well-nourished. No distress.  HENT:  Head: Normocephalic and atraumatic.  Eyes: Conjunctivae and EOM are normal. Pupils are equal, round, and reactive to light.  Neck: Normal range of motion. Neck supple. No thyromegaly present.  Cardiovascular: Normal rate, regular rhythm, normal heart sounds and intact distal pulses.   No murmur heard. Pulmonary/Chest: Effort normal and breath sounds normal. No respiratory distress.  Abdominal: Soft. She exhibits no distension. There is no tenderness.  Musculoskeletal: She exhibits no edema.  Lymphadenopathy:    She has no cervical adenopathy.  Neurological: She is alert and oriented to person, place, and time.  Skin: Skin is warm and dry.  Psychiatric: She has a normal mood and affect. Her behavior is normal.          Assessment & Plan:

## 2013-04-27 NOTE — Patient Instructions (Addendum)
Schedule your complete physical in 6 months We'll notify you of your lab results Keep up the good work!  You look great! Call with any questions or concerns Have a great summer!!!

## 2013-04-27 NOTE — Assessment & Plan Note (Signed)
Chronic problem.  BP adequately controlled.  Asymptomatic.  Check BMP.  No anticipated med changes.

## 2013-04-28 ENCOUNTER — Encounter: Payer: Self-pay | Admitting: *Deleted

## 2013-04-28 LAB — BASIC METABOLIC PANEL
Calcium: 9.2 mg/dL (ref 8.4–10.5)
Chloride: 103 mEq/L (ref 96–112)
Creatinine, Ser: 0.7 mg/dL (ref 0.4–1.2)
GFR: 91.86 mL/min (ref >60.00)

## 2013-06-30 ENCOUNTER — Ambulatory Visit (INDEPENDENT_AMBULATORY_CARE_PROVIDER_SITE_OTHER): Payer: BC Managed Care – PPO | Admitting: Family Medicine

## 2013-06-30 ENCOUNTER — Encounter: Payer: Self-pay | Admitting: Family Medicine

## 2013-06-30 VITALS — BP 130/80 | HR 94 | Temp 98.4°F | Ht 65.0 in | Wt 232.6 lb

## 2013-06-30 DIAGNOSIS — L237 Allergic contact dermatitis due to plants, except food: Secondary | ICD-10-CM | POA: Insufficient documentation

## 2013-06-30 DIAGNOSIS — L255 Unspecified contact dermatitis due to plants, except food: Secondary | ICD-10-CM

## 2013-06-30 DIAGNOSIS — M62838 Other muscle spasm: Secondary | ICD-10-CM | POA: Insufficient documentation

## 2013-06-30 MED ORDER — TRIAMCINOLONE ACETONIDE 0.1 % EX OINT: TOPICAL_OINTMENT | Freq: Two times a day (BID) | CUTANEOUS | Status: DC

## 2013-06-30 MED ORDER — NAPROXEN 500 MG PO TABS: 500.0000 mg | ORAL_TABLET | Freq: Two times a day (BID) | ORAL | Status: DC

## 2013-06-30 MED ORDER — CYCLOBENZAPRINE HCL 5 MG PO TABS: 5.0000 mg | ORAL_TABLET | Freq: Three times a day (TID) | ORAL | Status: DC | PRN

## 2013-06-30 NOTE — Patient Instructions (Addendum)
Start the flexeril for neck spasm- 1/2 to 1 tab during the day and 1-2 tabs at night HEAT! Start the Naproxen twice daily- take w/ food Use the Triamcinolone ointment on the poison Call with any questions or concerns HAVE AN AMAZING TRIP!!!

## 2013-06-30 NOTE — Assessment & Plan Note (Signed)
New.  Start scheduled NSAIDs and muscle relaxer prn.  Heat.  Reviewed supportive care and red flags that should prompt return.  Pt expressed understanding and is in agreement w/ plan.

## 2013-06-30 NOTE — Progress Notes (Signed)
  Subjective:    Patient ID: Diana Vincent, female    DOB: 09/12/1956, 57 y.o.   MRN: [id number]  HPI Neck pain- recently working in yard and feels she 'strained muscles in my neck'.  Pain x2.5 weeks.  Alternating hot/cold.  Ibuprofen.  'it's killing me'.  Leaving for [employer] on Sunday.  Pain is central, radiates up into head.  Worse w/ movement.  Poison ivy- L wrist.  Very itchy.  1st noticed 'several days ago'.     Review of Systems For ROS see HPI     Objective:   Physical Exam  Vitals reviewed. Constitutional: She appears well-developed and well-nourished. No distress.  Neck: Normal range of motion. Neck supple.  + trap spasm bilaterally Discomfort w/ flexion/extension and rotation  Skin: Skin is warm and dry. Rash (contact dermatitis on flexor surface of L wrist) noted.          Assessment & Plan:

## 2013-06-30 NOTE — Assessment & Plan Note (Signed)
Start steroid ointment.  No evidence of infxn.  Pt expressed understanding and is in agreement w/ plan.

## 2013-08-15 LAB — HM MAMMOGRAPHY: HM Mammogram: NORMAL

## 2013-10-13 IMAGING — CR DG CHEST 2V
2 series · 2 of 2 positions shown · non-contrast
Comparison: Chest CT 04/28/2012

CLINICAL DATA: Chest pain, shortness of breath.

CHEST - 2 VIEW

[w chest pa]
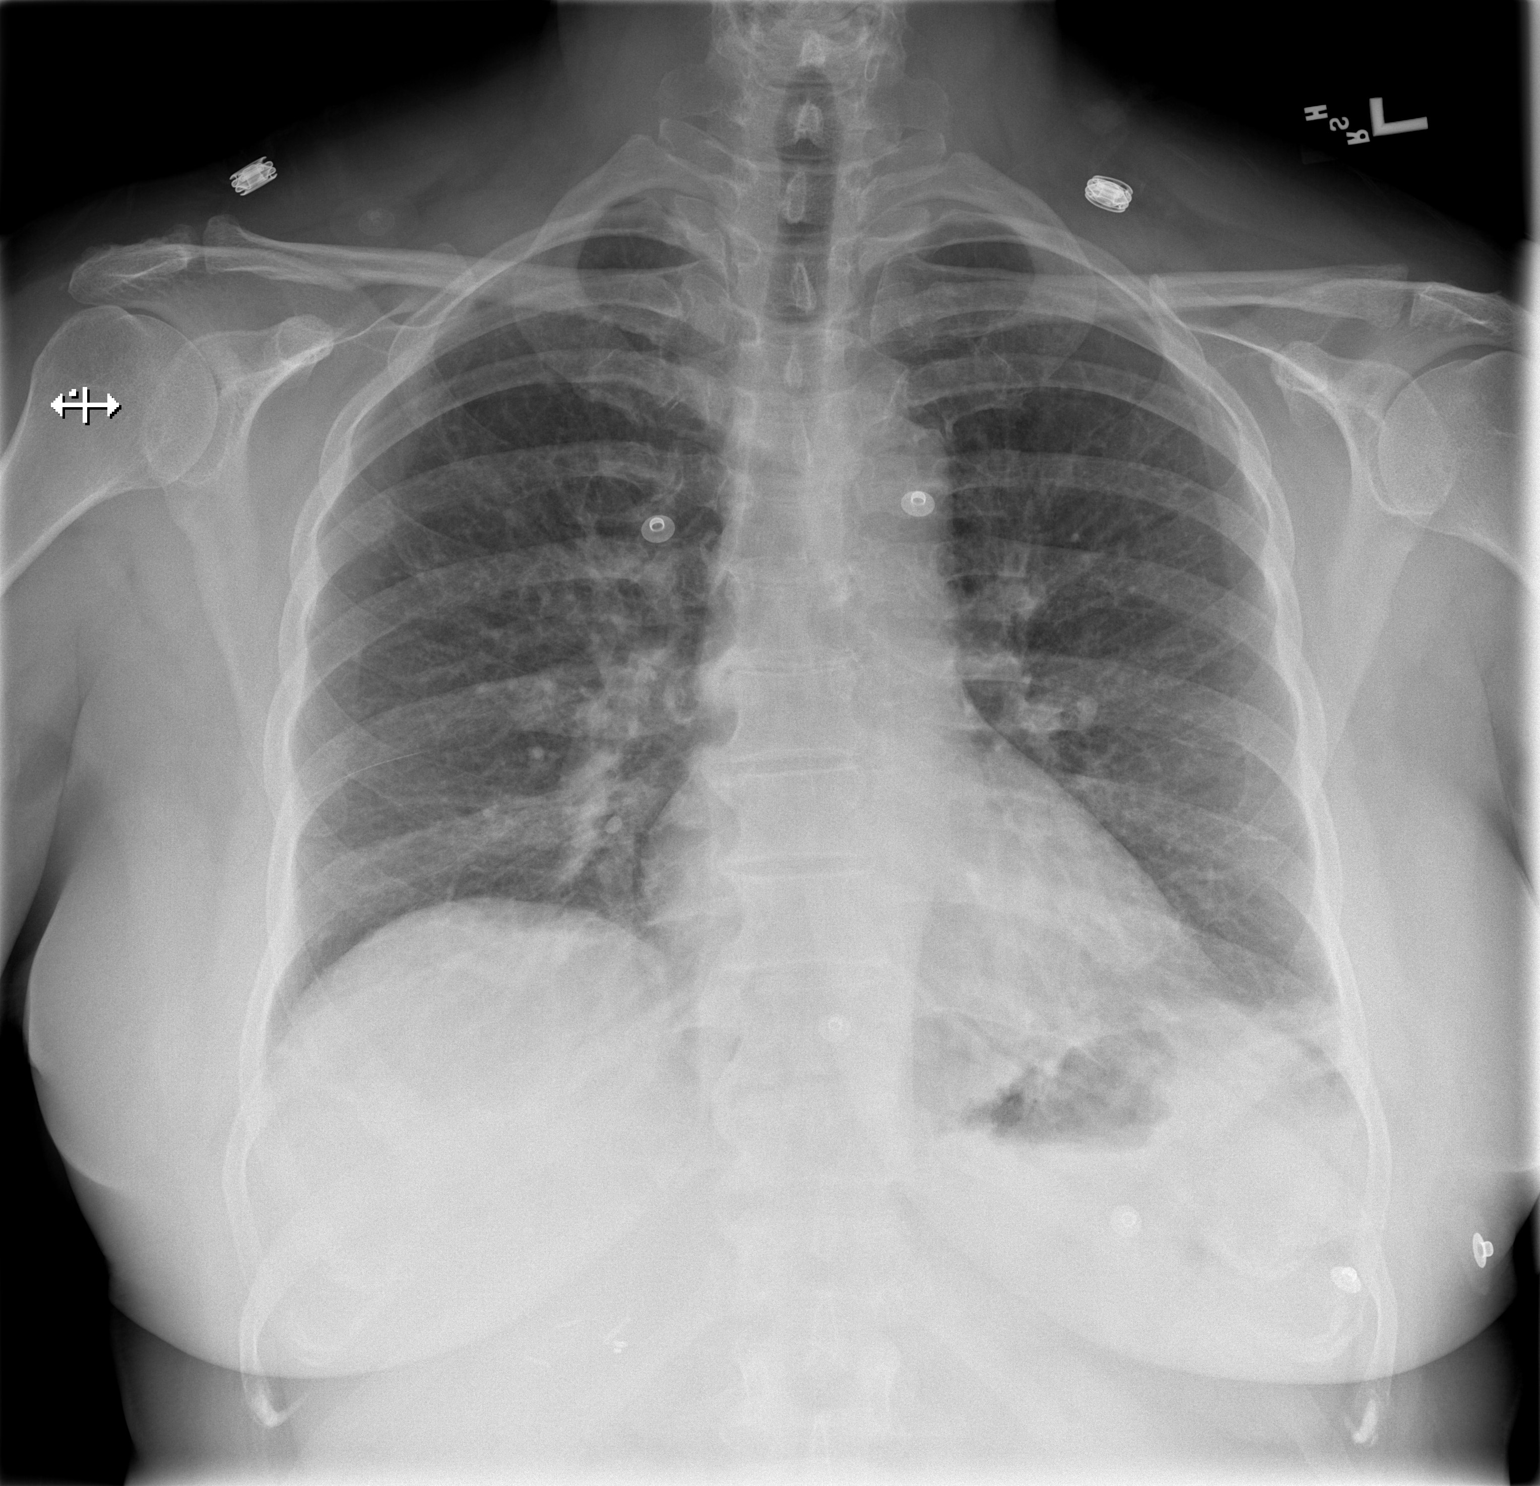

[w chest lat]
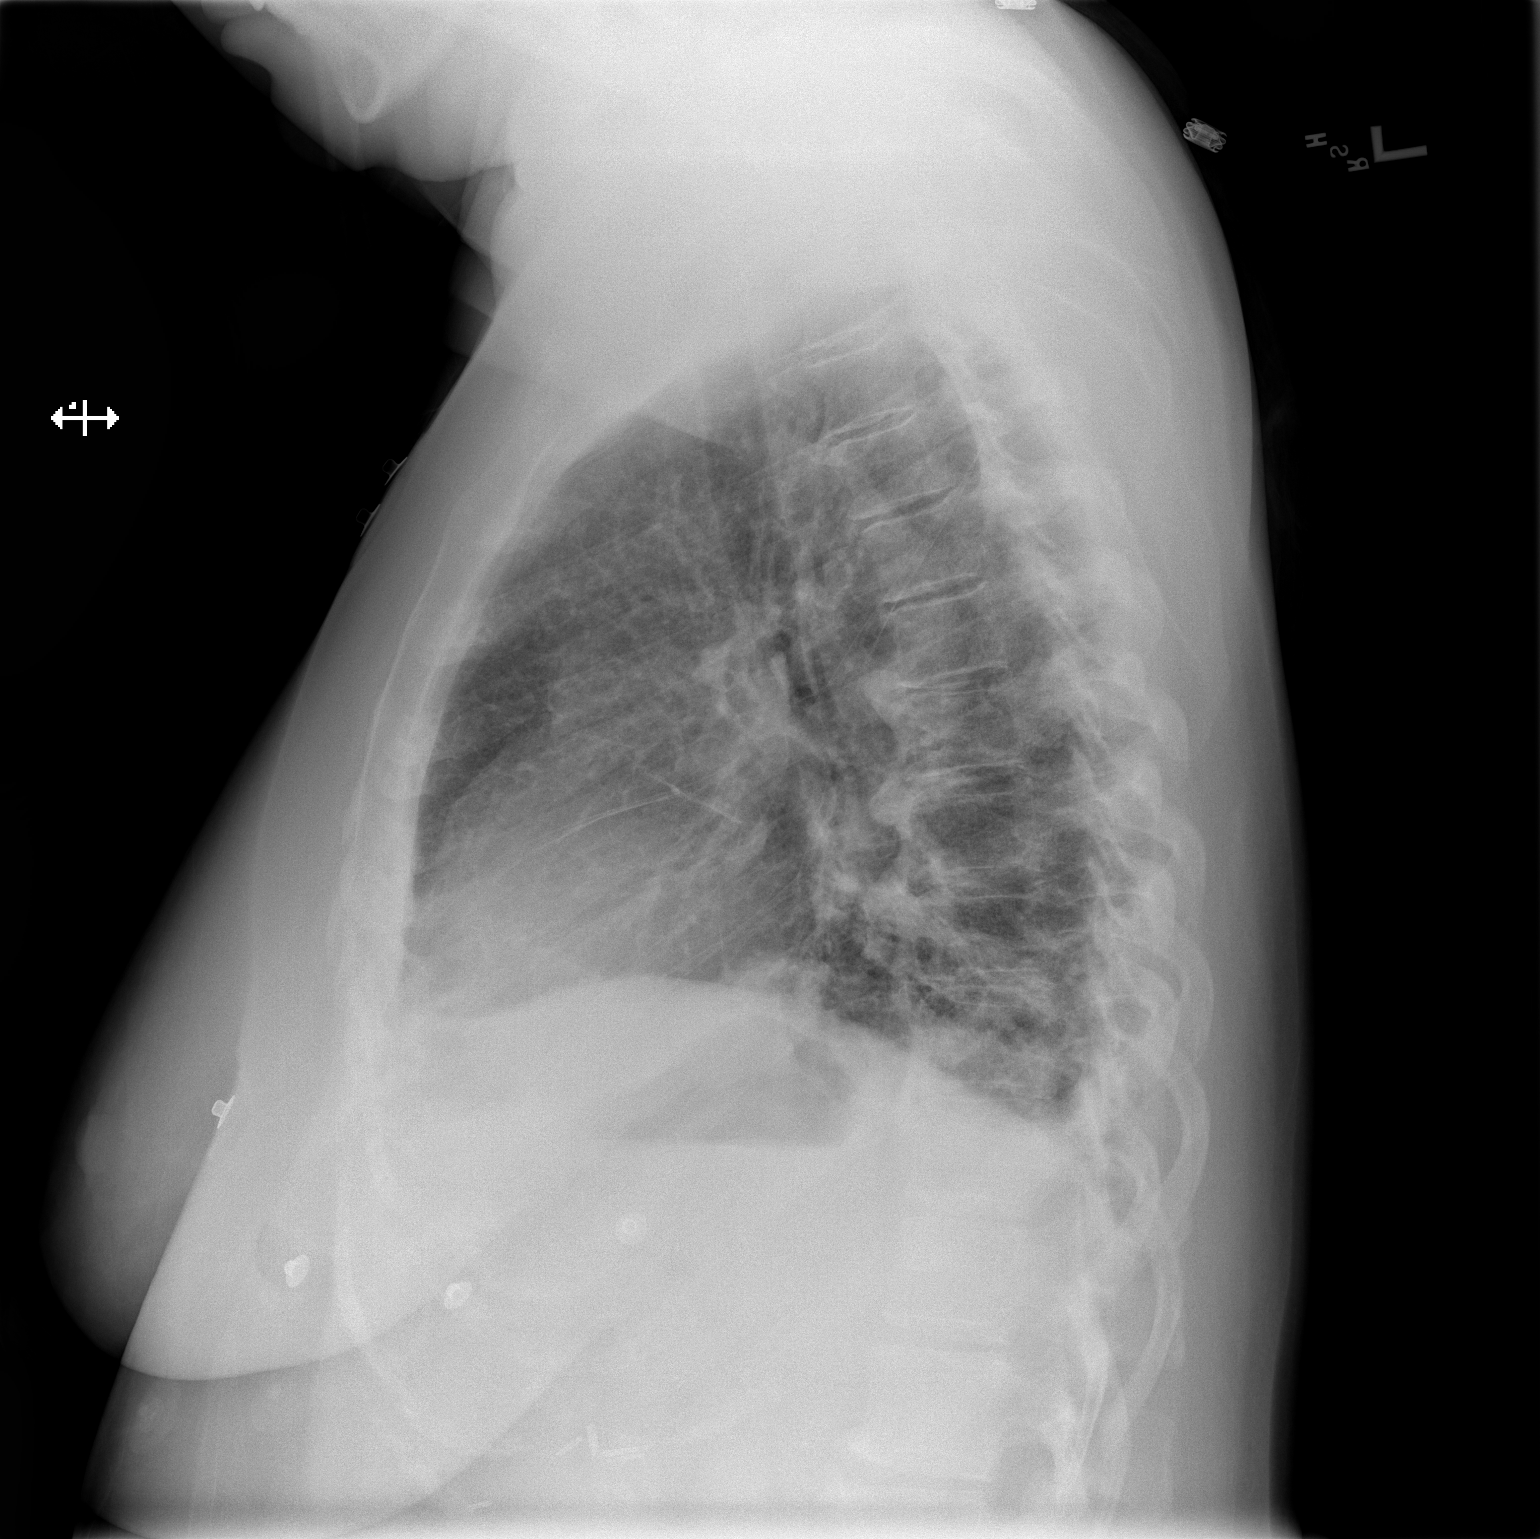

[2 of 2 positions shown; findings below may reference images not displayed]

FINDINGS: Airspace disease at the left lung base, question
pneumonia.  Minimal density in the right base, atelectasis versus
infiltrate.  Heart is normal size.  No effusions.  Degenerative
changes in the thoracic spine.
IMPRESSION: Bibasilar opacities, left greater than right, atelectasis versus
pneumonia.

## 2013-12-05 ENCOUNTER — Ambulatory Visit (INDEPENDENT_AMBULATORY_CARE_PROVIDER_SITE_OTHER): Payer: BC Managed Care – PPO | Admitting: Physician Assistant

## 2013-12-05 ENCOUNTER — Encounter: Payer: Self-pay | Admitting: Physician Assistant

## 2013-12-05 ENCOUNTER — Other Ambulatory Visit: Payer: Self-pay | Admitting: Physician Assistant

## 2013-12-05 VITALS — BP 118/80 | HR 83 | Temp 98.2°F | Resp 16 | Ht 65.0 in | Wt 227.5 lb

## 2013-12-05 DIAGNOSIS — J329 Chronic sinusitis, unspecified: Secondary | ICD-10-CM

## 2013-12-05 MED ORDER — FLUTICASONE PROPIONATE 50 MCG/ACT NA SUSP: 1.0000 | Freq: Every evening | NASAL | Status: DC | PRN

## 2013-12-05 MED ORDER — HYDROCODONE-HOMATROPINE 5-1.5 MG/5ML PO SYRP: 5.0000 mL | ORAL_SOLUTION | Freq: Three times a day (TID) | ORAL | Status: DC | PRN

## 2013-12-05 MED ORDER — AMOXICILLIN-POT CLAVULANATE 875-125 MG PO TABS: 1.0000 | ORAL_TABLET | Freq: Two times a day (BID) | ORAL | Status: DC

## 2013-12-05 NOTE — Patient Instructions (Signed)
Increase fluid intake.  Take antibiotic as prescribed.  Rest.  Saline nasal spray and Flonase.  Continue antihistamine.  Cough syrup at bedtime (can take 1/2 prescribed dose).

## 2013-12-05 NOTE — Progress Notes (Signed)
Patient ID: Diana Vincent, female   DOB: 07-15-1956, 57 y.o.   MRN: [id number]  Patient presents to clinic today complaining of one week of head congestion, sinus pressure, sinus pain, postnasal drainage, sore throat and cough. Patient states that cough has become slightly productive. Patient endorses occasional myalgias. Denies recent travel or sick contact. Patient does have history of seasonal allergies. Denies history of asthma. Patient denies shortness of breath and wheezing.  Past Medical History  Diagnosis Date  . Depression   . Hypertension   . Allergic rhinitis   . Head injury without skull fracture     no LOC  . Mandible fracture June 2013    hairline fx from fall out of swing-     Current Outpatient Prescriptions on File Prior to Visit  Medication Sig Dispense Refill  . amphetamine-dextroamphetamine (ADDERALL XR) 20 MG 24 hr capsule Take 1 capsule by mouth daily.      . DULoxetine (CYMBALTA) 60 MG capsule Take 60 mg by mouth daily.      Marland Kitchen estrogen-methylTESTOSTERone (ESTRATEST) 1.25-2.5 MG per tablet Take 1 tablet by mouth daily.      . valsartan (DIOVAN) 160 MG tablet Take 1 tablet (160 mg total) by mouth daily.  30 tablet  6  . zolpidem (AMBIEN) 10 MG tablet Take 5 mg by mouth at bedtime as needed. For sleep      . LORazepam (ATIVAN) 0.5 MG tablet        No current facility-administered medications on file prior to visit.    No Known Allergies  Family History  Problem Relation Age of Onset  . Hypertension      History   Social History  . Marital Status: Married    Spouse Name: N/A    Number of Children: N/A  . Years of Education: N/A   Social History Main Topics  . Smoking status: Never Smoker   . Smokeless tobacco: None  . Alcohol Use: Yes     Comment: 5 per week  . Drug Use: No  . Sexual Activity:    Other Topics Concern  . None   Social History Narrative  . None    Review of Systems - See HPI.  All other ROS are negative.  Filed Vitals:   12/05/13 1327  BP: 118/80  Pulse: 83  Temp: 98.2 F (36.8 C)  Resp: 16   Physical Exam  Vitals reviewed. Constitutional: She is oriented to person, place, and time and well-developed, well-nourished, and in no distress.  HENT:  Head: Normocephalic and atraumatic.  Right Ear: External ear normal.  Left Ear: External ear normal.  Nose: Nose normal.  Mouth/Throat: Oropharynx is clear and moist. No oropharyngeal exudate.  Tympanic membranes within normal limits bilaterally. Tenderness to percussion of sinuses noted.  Eyes: Conjunctivae are normal. Pupils are equal, round, and reactive to light.  Neck: Neck supple.  Cardiovascular: Normal rate, regular rhythm, normal heart sounds and intact distal pulses.   Pulmonary/Chest: Effort normal and breath sounds normal. No respiratory distress. She has no wheezes. She has no rales. She exhibits no tenderness.  Lymphadenopathy:    She has no cervical adenopathy.  Neurological: She is alert and oriented to person, place, and time.  Skin: Skin is warm and dry. No rash noted.  Psychiatric: Affect normal.    Assessment/Plan: Sinusitis Rx Augmentin. Rx Flonase. Rx Hycodan cough syrup. Increase fluid intake. Rest. Humidifier in bedroom. Over-the-counter antihistamine at bedtime. Coricidin HBP.

## 2013-12-05 NOTE — Progress Notes (Signed)
Pre visit review using our clinic review tool, if applicable. No additional management support is needed unless otherwise documented below in the visit note/SLS  

## 2013-12-06 DIAGNOSIS — J329 Chronic sinusitis, unspecified: Secondary | ICD-10-CM | POA: Insufficient documentation

## 2013-12-06 NOTE — Assessment & Plan Note (Signed)
Rx Augmentin. Rx Flonase. Rx Hycodan cough syrup. Increase fluid intake. Rest. Humidifier in bedroom. Over-the-counter antihistamine at bedtime. Coricidin HBP.

## 2013-12-11 ENCOUNTER — Other Ambulatory Visit: Payer: Self-pay | Admitting: Family Medicine

## 2013-12-12 NOTE — Telephone Encounter (Signed)
Med filled.  

## 2013-12-26 ENCOUNTER — Ambulatory Visit (INDEPENDENT_AMBULATORY_CARE_PROVIDER_SITE_OTHER): Payer: BC Managed Care – PPO | Admitting: Family Medicine

## 2013-12-26 ENCOUNTER — Encounter: Payer: Self-pay | Admitting: Family Medicine

## 2013-12-26 VITALS — BP 150/80 | HR 102 | Temp 98.2°F | Resp 16 | Wt 229.2 lb

## 2013-12-26 DIAGNOSIS — J019 Acute sinusitis, unspecified: Secondary | ICD-10-CM

## 2013-12-26 DIAGNOSIS — M6530 Trigger finger, unspecified finger: Secondary | ICD-10-CM

## 2013-12-26 MED ORDER — PREDNISONE 10 MG PO TABS: ORAL_TABLET | ORAL | Status: DC

## 2013-12-26 MED ORDER — FLUCONAZOLE 150 MG PO TABS: 150.0000 mg | ORAL_TABLET | Freq: Once | ORAL | Status: DC

## 2013-12-26 MED ORDER — PROMETHAZINE-DM 6.25-15 MG/5ML PO SYRP: 5.0000 mL | ORAL_SOLUTION | Freq: Four times a day (QID) | ORAL | Status: DC | PRN

## 2013-12-26 NOTE — Progress Notes (Signed)
   Subjective:    Patient ID: Diana Vincent, female    DOB: 1956/01/03, 58 y.o.   MRN: [id number]  HPI R hand pain- middle finger has been painful and tight for 6 weeks.  Decreased grip strength.  'it will catch and click'.  Sinus 'crap'- 3rd time in less than 2 months.  + sinus pain, nasal congestion, PND, hoarseness.  tx'd w/ Augmentin on 12/22.  No tooth pain.  No fevers.  No N/V/D.  Using Flonase and Allegra qAM.  Last night used Mucinex D.   Review of Systems For ROS see HPI     Objective:   Physical Exam  Vitals reviewed. Constitutional: She appears well-developed and well-nourished. No distress.  HENT:  Head: Normocephalic and atraumatic.  Right Ear: Tympanic membrane normal.  Left Ear: Tympanic membrane normal.  Nose: Mucosal edema and rhinorrhea present. Right sinus exhibits maxillary sinus tenderness (minimal) and frontal sinus tenderness (minimal). Left sinus exhibits maxillary sinus tenderness (minimal) and frontal sinus tenderness (minimal).  Mouth/Throat: Uvula is midline and mucous membranes are normal. Posterior oropharyngeal erythema present. No oropharyngeal exudate.  Eyes: Conjunctivae and EOM are normal. Pupils are equal, round, and reactive to light.  Neck: Normal range of motion. Neck supple.  Cardiovascular: Normal rate, regular rhythm and normal heart sounds.   Pulmonary/Chest: Effort normal and breath sounds normal. No respiratory distress. She has no wheezes.  Musculoskeletal: She exhibits no edema and no tenderness.  No triggering today of R hand Bilateral knuckle enlargement at PIP joints  Lymphadenopathy:    She has no cervical adenopathy.          Assessment & Plan:

## 2013-12-26 NOTE — Progress Notes (Signed)
Pre visit review using our clinic review tool, if applicable. No additional management support is needed unless otherwise documented below in the visit note. 

## 2013-12-26 NOTE — Patient Instructions (Signed)
Follow up as needed Start the Prednisone as directed- take w/ food- for sinus inflammation Cough syrup as needed- will cause drowsiness Mucinex DM for daytime cough Drink plenty of fluids REST! Hang in there!!!

## 2013-12-27 NOTE — Assessment & Plan Note (Signed)
New.  Refer to hand specialist for complete evaluation and tx

## 2013-12-27 NOTE — Assessment & Plan Note (Signed)
New.  Rather than start abx will tx w/ prednisone to control inflammation.  Cough meds prn.  Reviewed supportive care and red flags that should prompt return.  Pt expressed understanding and is in agreement w/ plan.

## 2014-01-05 ENCOUNTER — Telehealth: Payer: Self-pay

## 2014-01-05 NOTE — Telephone Encounter (Signed)
Prednisone can increase BP temporarily.  She should continue to monitor BP at home, schedule appt for early next week if remains high over the weekend.  The numbers she has are elevated for her but not cause for alarm.  We will definitely follow this w/ her

## 2014-01-05 NOTE — Telephone Encounter (Signed)
Call from patient and hs eststed her BP has been elevated since Sunday. She stated Monday 149/91 HR 95, Monday 154/87 HR  92, Yesterday 149/80 HR 102. Just completed prednisone on Monday and she is concerned about the elevated BP, she is taking Diovan 160 once a day and wants to know if she needs to increase the medication.  Please advise       KP

## 2014-01-05 NOTE — Telephone Encounter (Signed)
Patient has been made aware and voiced understanding, she will call if no improvement.      KP

## 2014-01-14 ENCOUNTER — Other Ambulatory Visit: Payer: Self-pay | Admitting: Family Medicine

## 2014-01-16 NOTE — Telephone Encounter (Signed)
Med filled.  

## 2014-01-19 ENCOUNTER — Ambulatory Visit: Payer: BC Managed Care – PPO | Admitting: Family Medicine

## 2014-01-23 ENCOUNTER — Encounter: Payer: Self-pay | Admitting: Family Medicine

## 2014-01-23 ENCOUNTER — Ambulatory Visit (INDEPENDENT_AMBULATORY_CARE_PROVIDER_SITE_OTHER): Payer: BC Managed Care – PPO | Admitting: Family Medicine

## 2014-01-23 VITALS — BP 150/90 | HR 91 | Temp 98.2°F | Resp 16 | Wt 226.1 lb

## 2014-01-23 DIAGNOSIS — F329 Major depressive disorder, single episode, unspecified: Secondary | ICD-10-CM

## 2014-01-23 DIAGNOSIS — I10 Essential (primary) hypertension: Secondary | ICD-10-CM

## 2014-01-23 DIAGNOSIS — M6530 Trigger finger, unspecified finger: Secondary | ICD-10-CM

## 2014-01-23 DIAGNOSIS — F3289 Other specified depressive episodes: Secondary | ICD-10-CM

## 2014-01-23 MED ORDER — VALSARTAN 320 MG PO TABS: 320.0000 mg | ORAL_TABLET | Freq: Every day | ORAL | Status: DC

## 2014-01-23 NOTE — Progress Notes (Signed)
Pre visit review using our clinic review tool, if applicable. No additional management support is needed unless otherwise documented below in the visit note. 

## 2014-01-23 NOTE — Patient Instructions (Signed)
Follow up in 3-4 weeks to recheck BP Increase the Diovan to 320mg - 2 of what you have at home and 1 of the new prescription Call Dr Arvil Persons office about the increased stress/depression We'll call you with your Hand Specialist appt Call with any questions or concerns Hang in there!!!

## 2014-01-23 NOTE — Progress Notes (Signed)
   Subjective:    Patient ID: Diana Vincent, female    DOB: [birth year], 58 y.o.   MRN: [id number]  HPI HTN- BP has been elevated for the last few weeks.  BP at home ranging 135-162/83-99.  + HAs.  Increased fatigue.  No CP, SOB, visual changes, edema.  Increased stress recently- husband lost job and parents having some medical issues.  On Diovan and this has previously controlled BP.  Depression- has been seeing Gala Murdoch (NP w/ Dr Caprice Beaver).  Was previously on Wellbutrin but has weaned off this.  Sleeping ok on 1/2 Ambien.    Review of Systems For ROS see HPI     Objective:   Physical Exam  Vitals reviewed. Constitutional: She is oriented to person, place, and time. She appears well-developed and well-nourished. No distress.  HENT:  Head: Normocephalic and atraumatic.  Eyes: Conjunctivae and EOM are normal. Pupils are equal, round, and reactive to light.  Neck: Normal range of motion. Neck supple. No thyromegaly present.  Cardiovascular: Normal rate, regular rhythm, normal heart sounds and intact distal pulses.   No murmur heard. Pulmonary/Chest: Effort normal and breath sounds normal. No respiratory distress.  Abdominal: Soft. She exhibits no distension. There is no tenderness.  Musculoskeletal: She exhibits no edema.  Lymphadenopathy:    She has no cervical adenopathy.  Neurological: She is alert and oriented to person, place, and time.  Skin: Skin is warm and dry.  Psychiatric: She has a normal mood and affect. Her behavior is normal.          Assessment & Plan:

## 2014-01-24 ENCOUNTER — Telehealth: Payer: Self-pay | Admitting: Family Medicine

## 2014-01-24 NOTE — Telephone Encounter (Signed)
Relevant patient education assigned to patient using Emmi. ° °

## 2014-01-24 NOTE — Assessment & Plan Note (Signed)
Chronic problem.  Deteriorated.  Suspect this is all due to stress and depression but will increase Divoan to 320mg  daily until pt's situation is not as stressful.  Will need repeat BMP and BP check in 3-4 weeks.  Pt to continue home BP monitoring and call if readings remain elevated.  Pt expressed understanding and is in agreement w/ plan.

## 2014-01-24 NOTE — Assessment & Plan Note (Signed)
Refer to hand specialist as was intended at previous visit.

## 2014-01-24 NOTE — Assessment & Plan Note (Signed)
Deteriorated.  Pt's Psych NP is retiring and pt is not sure what the next steps are.  Encouraged her to call psych office and let them know that sxs have worsened w/ recent home stress.  Will follow and assist as able.

## 2014-02-06 ENCOUNTER — Other Ambulatory Visit: Payer: Self-pay | Admitting: Physician Assistant

## 2014-02-22 ENCOUNTER — Encounter (INDEPENDENT_AMBULATORY_CARE_PROVIDER_SITE_OTHER): Payer: Self-pay

## 2014-02-22 ENCOUNTER — Encounter: Payer: Self-pay | Admitting: Family Medicine

## 2014-02-22 ENCOUNTER — Ambulatory Visit (INDEPENDENT_AMBULATORY_CARE_PROVIDER_SITE_OTHER): Payer: BC Managed Care – PPO | Admitting: Family Medicine

## 2014-02-22 VITALS — BP 130/82 | HR 96 | Temp 98.4°F | Resp 16 | Wt 225.4 lb

## 2014-02-22 DIAGNOSIS — I10 Essential (primary) hypertension: Secondary | ICD-10-CM

## 2014-02-22 NOTE — Patient Instructions (Signed)
Follow up as scheduled in June Continue the higher dose of Diovan- the BP looks MUCH better! We'll notify you of your lab results and make any changes if needed Keep up the good work! Happy Spring!!!

## 2014-02-22 NOTE — Progress Notes (Signed)
Pre visit review using our clinic review tool, if applicable. No additional management support is needed unless otherwise documented below in the visit note. 

## 2014-02-22 NOTE — Progress Notes (Signed)
   Subjective:    Patient ID: Diana Vincent, female    DOB: 1956/02/18, 58 y.o.   MRN: [id number]  HPI HTN- chronic problem, Diovan was increased to 320mg  at last visit.  Pt reports HAs have resolved.  BP is much improved today.  No CP, SOB, HAs, visual changes, edema.   Review of Systems For ROS see HPI     Objective:   Physical Exam  Constitutional: She is oriented to person, place, and time. She appears well-developed and well-nourished. No distress.  HENT:  Head: Normocephalic and atraumatic.  Eyes: Conjunctivae and EOM are normal. Pupils are equal, round, and reactive to light.  Neck: Normal range of motion. Neck supple. No thyromegaly present.  Cardiovascular: Normal rate, regular rhythm, normal heart sounds and intact distal pulses.   No murmur heard. Pulmonary/Chest: Effort normal and breath sounds normal. No respiratory distress.  Abdominal: Soft. She exhibits no distension. There is no tenderness.  Musculoskeletal: She exhibits no edema.  Lymphadenopathy:    She has no cervical adenopathy.  Neurological: She is alert and oriented to person, place, and time.  Skin: Skin is warm and dry.  Psychiatric: She has a normal mood and affect. Her behavior is normal.          Assessment & Plan:

## 2014-02-22 NOTE — Assessment & Plan Note (Signed)
Improved since increasing Diovan to 320mg .  Asymptomatic.  Repeat BMP to assess.  Will follow.

## 2014-02-23 ENCOUNTER — Encounter: Payer: Self-pay | Admitting: General Practice

## 2014-02-23 LAB — BASIC METABOLIC PANEL
BUN: 19 mg/dL (ref 6–23)
CALCIUM: 9.3 mg/dL (ref 8.4–10.5)
CO2: 26 mEq/L (ref 19–32)
Chloride: 106 mEq/L (ref 96–112)
Creatinine, Ser: 0.8 mg/dL (ref 0.4–1.2)
GFR: 84.58 mL/min (ref >60.00)
Glucose, Bld: 74 mg/dL (ref 70–99)
Potassium: 4 mEq/L (ref 3.5–5.1)
SODIUM: 139 meq/L (ref 135–145)

## 2014-06-06 ENCOUNTER — Telehealth: Payer: Self-pay

## 2014-06-06 NOTE — Telephone Encounter (Signed)
Medication and allergies:  Reviewed and updated  90 day supply/mail order: n/a Local pharmacy:  [id number] - [place], [address] AT [address]   Immunizations due:  UTD   A/P: No changes to personal, family history or past surgical hx PAP- 05/19/12- negative; [place]- 07/31/10--benign polyp & diverticula; repeat in 5 years (07/2015); Dr. Sharlett Iles [facility]- 08/15/13 Tdap- 06/09/12   To Discuss with Edwin Cherian: Rash under both breast.

## 2014-06-07 ENCOUNTER — Ambulatory Visit (INDEPENDENT_AMBULATORY_CARE_PROVIDER_SITE_OTHER): Payer: BC Managed Care – PPO | Admitting: Family Medicine

## 2014-06-07 ENCOUNTER — Encounter: Payer: Self-pay | Admitting: Family Medicine

## 2014-06-07 ENCOUNTER — Telehealth: Payer: Self-pay | Admitting: *Deleted

## 2014-06-07 ENCOUNTER — Encounter: Payer: Self-pay | Admitting: General Practice

## 2014-06-07 VITALS — BP 126/80 | HR 92 | Temp 98.1°F | Resp 16 | Ht 64.5 in | Wt 219.2 lb

## 2014-06-07 DIAGNOSIS — E2839 Other primary ovarian failure: Secondary | ICD-10-CM

## 2014-06-07 DIAGNOSIS — R911 Solitary pulmonary nodule: Secondary | ICD-10-CM | POA: Insufficient documentation

## 2014-06-07 DIAGNOSIS — Z0000 Encounter for general adult medical examination without abnormal findings: Secondary | ICD-10-CM

## 2014-06-07 LAB — LIPID PANEL
CHOLESTEROL: 94 mg/dL (ref 0–200)
HDL: 29.1 mg/dL — AB (ref >39.00)
LDL Cholesterol: 40 mg/dL (ref 0–99)
NONHDL: 64.9
Total CHOL/HDL Ratio: 3
Triglycerides: 127 mg/dL (ref 0.0–149.0)
VLDL: 25.4 mg/dL (ref 0.0–40.0)

## 2014-06-07 LAB — TSH: TSH: 2.22 u[IU]/mL (ref 0.35–4.50)

## 2014-06-07 LAB — BASIC METABOLIC PANEL
BUN: 15 mg/dL (ref 6–23)
CALCIUM: 9.1 mg/dL (ref 8.4–10.5)
CHLORIDE: 103 meq/L (ref 96–112)
CO2: 29 meq/L (ref 19–32)
Creatinine, Ser: 0.6 mg/dL (ref 0.4–1.2)
GFR: 103.32 mL/min (ref >60.00)
GLUCOSE: 89 mg/dL (ref 70–99)
Potassium: 4 mEq/L (ref 3.5–5.1)
Sodium: 137 mEq/L (ref 135–145)

## 2014-06-07 LAB — CBC WITH DIFFERENTIAL/PLATELET
BASOS ABS: 0 10*3/uL (ref 0.0–0.1)
Basophils Relative: 0.3 % (ref 0.0–3.0)
EOS ABS: 0.1 10*3/uL (ref 0.0–0.7)
Eosinophils Relative: 1.5 % (ref 0.0–5.0)
HCT: 50.2 % — ABNORMAL HIGH (ref 36.0–46.0)
Hemoglobin: 16.8 g/dL — ABNORMAL HIGH (ref 12.0–15.0)
LYMPHS PCT: 20.3 % (ref 12.0–46.0)
Lymphs Abs: 1.6 10*3/uL (ref 0.7–4.0)
MCHC: 33.5 g/dL (ref 30.0–36.0)
MCV: 94.3 fl (ref 78.0–100.0)
MONO ABS: 0.7 10*3/uL (ref 0.1–1.0)
Monocytes Relative: 9.2 % (ref 3.0–12.0)
Neutro Abs: 5.4 10*3/uL (ref 1.4–7.7)
Neutrophils Relative %: 68.7 % (ref 43.0–77.0)
PLATELETS: 380 10*3/uL (ref 150.0–400.0)
RBC: 5.32 Mil/uL — AB (ref 3.87–5.11)
RDW: 13.1 % (ref 11.5–15.5)
WBC: 7.8 10*3/uL (ref 4.0–10.5)

## 2014-06-07 LAB — HEPATIC FUNCTION PANEL
ALT: 38 U/L — AB (ref 0–35)
AST: 27 U/L (ref 0–37)
Albumin: 4.1 g/dL (ref 3.5–5.2)
Alkaline Phosphatase: 47 U/L (ref 39–117)
BILIRUBIN TOTAL: 0.5 mg/dL (ref 0.2–1.2)
Bilirubin, Direct: 0 mg/dL (ref 0.0–0.3)
TOTAL PROTEIN: 7.1 g/dL (ref 6.0–8.3)

## 2014-06-07 LAB — VITAMIN D 25 HYDROXY (VIT D DEFICIENCY, FRACTURES): VITD: 39.17 ng/mL

## 2014-06-07 MED ORDER — CLOTRIMAZOLE-BETAMETHASONE 1-0.05 % EX CREA: 1.0000 "application " | TOPICAL_CREAM | Freq: Two times a day (BID) | CUTANEOUS | Status: DC

## 2014-06-07 NOTE — Assessment & Plan Note (Signed)
Pt's PE WNL w/ exception of fungal dermatitis under breasts.  UTD on GYN, colonoscopy.  Due for DEXA- order entered.  Check labs.  Anticipatory guidance provided.

## 2014-06-07 NOTE — Assessment & Plan Note (Signed)
Noted in 5/13.  Had subsequent CT angio w/o mention of nodule.  Will repeat CT scan to determine if still present and/or changing.  Pt expressed understanding and is in agreement w/ plan.

## 2014-06-07 NOTE — Telephone Encounter (Signed)
Caller name:  Kylie Relation to pt:  Christiane Ha Call back number: [phone]  Pharmacy:  Reason for call:   Kylie called to inform us that the pre-cert for CT Chest w/out contrast was made out to [facility] and needs to be made out to [facility].  Please advise.  bw

## 2014-06-07 NOTE — Progress Notes (Signed)
Pre visit review using our clinic review tool, if applicable. No additional management support is needed unless otherwise documented below in the visit note. 

## 2014-06-07 NOTE — Patient Instructions (Signed)
Follow up in 6 months to recheck BP We'll notify you of your lab results and make any changes if needed Use the Lotrisone cream as needed on the rash We'll call you with your bone density and CT scan appts Call with any questions or concerns Have a great summer and enjoy [place]!!!

## 2014-06-07 NOTE — Progress Notes (Signed)
   Subjective:    Patient ID: Diana Vincent, female    DOB: [birth year], 58 y.o.   MRN: [id number]  HPI CPE- UTD on pap w/ GYN, colonoscopy, mammo.  Due for DEXA, has never had one.   Review of Systems Patient reports no vision/ hearing changes, adenopathy,fever, weight change,  persistant/recurrent hoarseness , swallowing issues, chest pain, palpitations, edema, persistant/recurrent cough, hemoptysis, dyspnea (rest/exertional/paroxysmal nocturnal), gastrointestinal bleeding (melena, rectal bleeding), abdominal pain, significant heartburn, bowel changes, GU symptoms (dysuria, hematuria, incontinence), Gyn symptoms (abnormal  bleeding, pain),  syncope, focal weakness, memory loss, numbness & tingling, hair/nail changes, abnormal bruising or bleeding, anxiety, or depression.  + rash under breasts bilaterally- 'it was so bad'.  Started using monistat w/ some improvement.    Objective:   Physical Exam General Appearance:    Alert, cooperative, no distress, appears stated age  Head:    Normocephalic, without obvious abnormality, atraumatic  Eyes:    PERRL, conjunctiva/corneas clear, EOM's intact, fundi    benign, both eyes  Ears:    Normal TM's and external ear canals, both ears  Nose:   Nares normal, septum midline, mucosa normal, no drainage    or sinus tenderness  Throat:   Lips, mucosa, and tongue normal; teeth and gums normal  Neck:   Supple, symmetrical, trachea midline, no adenopathy;    Thyroid: no enlargement/tenderness/nodules  Back:     Symmetric, no curvature, ROM normal, no CVA tenderness  Lungs:     Clear to auscultation bilaterally, respirations unlabored  Chest Wall:    No tenderness or deformity   Heart:    Regular rate and rhythm, S1 and S2 normal, no murmur, rub   or gallop  Breast Exam:    Deferred to GYN  Abdomen:     Soft, non-tender, bowel sounds active all four quadrants,    no masses, no organomegaly  Genitalia:    Deferred to GYN  Rectal:    Extremities:    Extremities normal, atraumatic, no cyanosis or edema  Pulses:   2+ and symmetric all extremities  Skin:   Skin color, texture, turgor normal, fungal dermatitis under breasts bilaterally  Lymph nodes:   Cervical, supraclavicular, and axillary nodes normal  Neurologic:   CNII-XII intact, normal strength, sensation and reflexes    throughout          Assessment & Plan:

## 2014-06-08 ENCOUNTER — Ambulatory Visit (HOSPITAL_BASED_OUTPATIENT_CLINIC_OR_DEPARTMENT_OTHER)
Admission: RE | Admit: 2014-06-08 | Discharge: 2014-06-08 | Disposition: A | Discharge status: Home / Self Care | Payer: BC Managed Care – PPO | Source: Ambulatory Visit | Attending: Family Medicine | Admitting: Family Medicine

## 2014-06-08 DIAGNOSIS — R911 Solitary pulmonary nodule: Secondary | ICD-10-CM | POA: Insufficient documentation

## 2014-06-09 ENCOUNTER — Ambulatory Visit (INDEPENDENT_AMBULATORY_CARE_PROVIDER_SITE_OTHER)
Admission: RE | Admit: 2014-06-09 | Discharge: 2014-06-09 | Disposition: A | Discharge status: Home / Self Care | Payer: BC Managed Care – PPO | Source: Ambulatory Visit | Attending: Family Medicine | Admitting: Family Medicine

## 2014-06-09 ENCOUNTER — Ambulatory Visit (HOSPITAL_BASED_OUTPATIENT_CLINIC_OR_DEPARTMENT_OTHER): Payer: BC Managed Care – PPO

## 2014-06-09 DIAGNOSIS — E2839 Other primary ovarian failure: Secondary | ICD-10-CM

## 2014-06-12 ENCOUNTER — Encounter: Payer: Self-pay | Admitting: General Practice

## 2014-07-25 ENCOUNTER — Encounter: Payer: Self-pay | Admitting: Internal Medicine

## 2014-08-04 ENCOUNTER — Other Ambulatory Visit: Payer: Self-pay | Admitting: Family Medicine

## 2014-08-04 NOTE — Telephone Encounter (Signed)
Med filled.  

## 2014-08-26 ENCOUNTER — Other Ambulatory Visit: Payer: Self-pay | Admitting: Family Medicine

## 2014-08-28 NOTE — Telephone Encounter (Signed)
Med filled.  

## 2014-11-29 ENCOUNTER — Other Ambulatory Visit: Payer: Self-pay | Admitting: Family Medicine

## 2014-11-30 NOTE — Telephone Encounter (Signed)
Med filled.  

## 2014-12-27 ENCOUNTER — Other Ambulatory Visit: Payer: Self-pay | Admitting: Family Medicine

## 2014-12-28 NOTE — Telephone Encounter (Signed)
Med filled.  

## 2015-01-03 ENCOUNTER — Encounter: Payer: Self-pay | Admitting: Family Medicine

## 2015-01-03 ENCOUNTER — Ambulatory Visit (INDEPENDENT_AMBULATORY_CARE_PROVIDER_SITE_OTHER): Payer: BC Managed Care – PPO | Admitting: Family Medicine

## 2015-01-03 VITALS — BP 122/78 | HR 81 | Temp 98.1°F | Resp 16 | Wt 222.0 lb

## 2015-01-03 DIAGNOSIS — I10 Essential (primary) hypertension: Secondary | ICD-10-CM

## 2015-01-03 NOTE — Progress Notes (Signed)
   Subjective:    Patient ID: Diana Vincent, female    DOB: [birth year], 59 y.o.   MRN: [id number]  HPI HTN- chronic problem, on Diovan 320mg  daily.  No CP, SOB, HAs, visual changes, edema, N/V.  No formal exercise but is active in daily activities.     Review of Systems For ROS see HPI     Objective:   Physical Exam  Constitutional: She is oriented to person, place, and time. She appears well-developed and well-nourished. No distress.  HENT:  Head: Normocephalic and atraumatic.  Eyes: Conjunctivae and EOM are normal. Pupils are equal, round, and reactive to light.  Neck: Normal range of motion. Neck supple. No thyromegaly present.  Cardiovascular: Normal rate, regular rhythm, normal heart sounds and intact distal pulses.   No murmur heard. Pulmonary/Chest: Effort normal and breath sounds normal. No respiratory distress.  Abdominal: Soft. She exhibits no distension. There is no tenderness.  Musculoskeletal: She exhibits no edema.  Lymphadenopathy:    She has no cervical adenopathy.  Neurological: She is alert and oriented to person, place, and time.  Skin: Skin is warm and dry.  Psychiatric: She has a normal mood and affect. Her behavior is normal.  Vitals reviewed.         Assessment & Plan:

## 2015-01-03 NOTE — Patient Instructions (Signed)
Schedule your complete physical in 6 months We'll notify you of your lab results and make any changes if needed Continue to make healthy food choices and get regular exercise Call with any questions or concerns Hang in there!! Happy New Year!!!

## 2015-01-03 NOTE — Progress Notes (Signed)
Pre visit review using our clinic review tool, if applicable. No additional management support is needed unless otherwise documented below in the visit note. 

## 2015-01-04 ENCOUNTER — Encounter: Payer: Self-pay | Admitting: General Practice

## 2015-01-04 LAB — BASIC METABOLIC PANEL
BUN: 13 mg/dL (ref 6–23)
CALCIUM: 9.3 mg/dL (ref 8.4–10.5)
CO2: 27 mEq/L (ref 19–32)
CREATININE: 0.72 mg/dL (ref 0.40–1.20)
Chloride: 104 mEq/L (ref 96–112)
GFR: 88.39 mL/min (ref >60.00)
Glucose, Bld: 93 mg/dL (ref 70–99)
Potassium: 4.3 mEq/L (ref 3.5–5.1)
SODIUM: 138 meq/L (ref 135–145)

## 2015-01-04 NOTE — Assessment & Plan Note (Signed)
Chronic problem.  Excellent control.  Asymptomatic.  Check labs.  No anticipated med changes.  Will follow. 

## 2015-03-26 ENCOUNTER — Other Ambulatory Visit: Payer: Self-pay | Admitting: Family Medicine

## 2015-03-27 NOTE — Telephone Encounter (Signed)
Med filled.  

## 2015-06-04 ENCOUNTER — Other Ambulatory Visit (HOSPITAL_COMMUNITY)
Admission: RE | Admit: 2015-06-04 | Discharge: 2015-06-04 | Disposition: A | Discharge status: Home / Self Care | Payer: BC Managed Care – PPO | Source: Ambulatory Visit | Attending: Medical | Admitting: Medical

## 2015-06-04 ENCOUNTER — Encounter: Payer: Self-pay | Admitting: Medical

## 2015-06-04 ENCOUNTER — Ambulatory Visit (INDEPENDENT_AMBULATORY_CARE_PROVIDER_SITE_OTHER): Payer: BC Managed Care – PPO | Admitting: Medical

## 2015-06-04 VITALS — BP 160/90 | HR 88 | Temp 98.7°F | Ht 64.5 in | Wt 221.6 lb

## 2015-06-04 DIAGNOSIS — N898 Other specified noninflammatory disorders of vagina: Secondary | ICD-10-CM

## 2015-06-04 DIAGNOSIS — I10 Essential (primary) hypertension: Secondary | ICD-10-CM | POA: Diagnosis not present

## 2015-06-04 DIAGNOSIS — N390 Urinary tract infection, site not specified: Secondary | ICD-10-CM

## 2015-06-04 DIAGNOSIS — R82998 Other abnormal findings in urine: Secondary | ICD-10-CM

## 2015-06-04 DIAGNOSIS — N760 Acute vaginitis: Secondary | ICD-10-CM | POA: Insufficient documentation

## 2015-06-04 DIAGNOSIS — Z113 Encounter for screening for infections with a predominantly sexual mode of transmission: Secondary | ICD-10-CM | POA: Diagnosis present

## 2015-06-04 LAB — POCT URINALYSIS DIPSTICK
BILIRUBIN UA: 1
Blood, UA: 50
Glucose, UA: NEGATIVE
KETONES UA: NEGATIVE
Nitrite, UA: NEGATIVE
Protein, UA: 15
Spec Grav, UA: 1.02
Urobilinogen, UA: 0.2
pH, UA: 6

## 2015-06-04 MED ORDER — FLUCONAZOLE 150 MG PO TABS: ORAL_TABLET | ORAL | Status: DC

## 2015-06-04 MED ORDER — CIPROFLOXACIN HCL 250 MG PO TABS: 250.0000 mg | ORAL_TABLET | Freq: Two times a day (BID) | ORAL | Status: DC

## 2015-06-04 MED ORDER — HYDROCHLOROTHIAZIDE 12.5 MG PO TABS: 12.5000 mg | ORAL_TABLET | Freq: Every day | ORAL | Status: DC

## 2015-06-04 NOTE — Assessment & Plan Note (Signed)
Possible yeast infection vs uti. Will rx diflucan 1 tab today and another in on wed.

## 2015-06-04 NOTE — Addendum Note (Signed)
Addended by: Modena Morrow D on: 06/04/2015 12:39 PM   Modules accepted: Orders

## 2015-06-04 NOTE — Patient Instructions (Addendum)
Essential hypertension Bp is elevated. Will add hctz 12.5 mg to your diovan 320 mg. If bp is controlled and no side effects then can fill this as combination tablet in near future.  Vaginal irritation Possible yeast infection vs uti. Will rx diflucan 1 tab today and another in on wed.  Leukocytes in urine Pending urine culture results. If symptoms worsen despite diflucan. Then start cipro 250 mg twice daily for 3 days.    On follow up in 3 wks will get cmp.  Please a banana every other day during interim to keep k level up. Urine culture and ancillary studies pending. Follow up in 7 days or as needed

## 2015-06-04 NOTE — Progress Notes (Signed)
Subjective:    Patient ID: Diana Vincent, female    DOB: 05-24-1956, 59 y.o.   MRN: [id number]  HPI  Pt in states her blood pressure has been running high. Pt has been on vyvanse for 1-2 years.  No cardiac or neurologic signs or symptoms. Pt is on valsartan 320 mg a day. Pt b blocker made drowsy, and ace inhibitors. Pt has never been on diuretic. Pt states at psychiatrist office 166/98 last week.  Also she thinks maybe yeast infection. Pt has used monistat for 3 days. It hurt actually.Pt feels raw.  A year ago needed 2 diflucanan to get.  Mild difficult to start stream   Review of Systems  Constitutional: Negative for fever, chills, diaphoresis, activity change and fatigue.  Respiratory: Negative for cough, chest tightness, shortness of breath and wheezing.   Cardiovascular: Negative for chest pain, palpitations and leg swelling.  Gastrointestinal: Negative for nausea, vomiting and abdominal pain.  Genitourinary: Negative for dysuria, urgency, frequency, vaginal bleeding, vaginal pain and pelvic pain.       Vaginal irritation.  Musculoskeletal: Negative for neck pain and neck stiffness.  Neurological: Negative for dizziness, tremors, seizures, syncope, facial asymmetry, speech difficulty, weakness, light-headedness, numbness and headaches.  Psychiatric/Behavioral: Negative for behavioral problems, confusion and agitation. The patient is not nervous/anxious.    Past Medical History  Diagnosis Date  . Depression   . Hypertension   . Allergic rhinitis   . Head injury without skull fracture     no LOC  . Mandible fracture June 2013    hairline fx from fall out of swing-     History   Social History  . Marital Status: Married    Spouse Name: N/A  . Number of Children: N/A  . Years of Education: N/A   Occupational History  . Not on file.   Social History Main Topics  . Smoking status: Never Smoker   . Smokeless tobacco: Not on file  . Alcohol Use: Yes     Comment: 5 per  week  . Drug Use: No  . Sexual Activity: Not on file   Other Topics Concern  . Not on file   Social History Narrative    Past Surgical History  Procedure Laterality Date  . Abdominal hysterectomy  1990s  . Cholecystectomy    . Bladder suspension  07/07/2012    Procedure: TRANSVAGINAL TAPE (TVT) PROCEDURE;  Surgeon: Daria Pastures, MD;  Location: [facility];  Service: Gynecology;  Laterality: N/A;  . Anterior and posterior repair  07/07/2012    Procedure: ANTERIOR (CYSTOCELE) AND POSTERIOR REPAIR (RECTOCELE);  Surgeon: Daria Pastures, MD;  Location: [facility];  Service: Gynecology;  Laterality: N/A;  . Cystoscopy  07/07/2012    Procedure: CYSTOSCOPY;  Surgeon: Daria Pastures, MD;  Location: [facility];  Service: Gynecology;;    Family History  Problem Relation Age of Onset  . Hypertension      No Known Allergies  Current Outpatient Prescriptions on File Prior to Visit  Medication Sig Dispense Refill  . escitalopram (LEXAPRO) 10 MG tablet Take 10 mg by mouth daily.    Marland Kitchen estrogen-methylTESTOSTERone (ESTRATEST) 1.25-2.5 MG per tablet Take 1 tablet by mouth daily.    Marland Kitchen LORazepam (ATIVAN) 0.5 MG tablet     . valsartan (DIOVAN) 320 MG tablet TAKE 1 TABLET BY MOUTH EVERY DAY 90 tablet 1  . zolpidem (AMBIEN) 10 MG tablet Take 5 mg by mouth at bedtime as needed. For sleep    .  lisdexamfetamine (VYVANSE) 60 MG capsule Take 60 mg by mouth every morning.     No current facility-administered medications on file prior to visit.    BP 160/90 mmHg  Pulse 88  Temp(Src) 98.7 F (37.1 C) (Oral)  Ht 5' 4.5" (1.638 m)  Wt 221 lb 9.6 oz (100.517 kg)  BMI 37.46 kg/m2  SpO2 97%       Objective:   Physical Exam  General Mental Status- Alert. General Appearance- Not in acute distress.   Skin General: Color- Normal Color. Moisture- Normal Moisture.  Neck Carotid Arteries- Normal color. Moisture- Normal Moisture. No carotid bruits. No JVD.  Chest and Lung Exam Auscultation: Breath  Sounds:-Normal.  Cardiovascular Auscultation:Rythm- Regular. Murmurs & Other Heart Sounds:Auscultation of the heart reveals- No Murmurs.  Abdomen Inspection:-Inspeection Normal. Palpation/Percussion:Note:No mass. Palpation and Percussion of the abdomen reveal- Non Tender suprapubic area, Non Distended + BS, no rebound or guarding.  Back- no cva tenderness.  Neurologic Cranial Nerve exam:- CN III-XII intact(No nystagmus), symmetric smile. Romberg Exam:- Negative.  Finger to Nose:- Normal/Intact Strength:- 5/5 equal and symmetric strength both upper and lower extremities.     Assessment & Plan:

## 2015-06-04 NOTE — Assessment & Plan Note (Signed)
Bp is elevated. Will add hctz 12.5 mg to your diovan 320 mg. If bp is controlled and no side effects then can fill this as combination tablet in near future.

## 2015-06-04 NOTE — Progress Notes (Signed)
Pre visit review using our clinic review tool, if applicable. No additional management support is needed unless otherwise documented below in the visit note. 

## 2015-06-04 NOTE — Addendum Note (Signed)
Addended by: Bunnie Domino on: 06/04/2015 02:04 PM   Modules accepted: Orders

## 2015-06-04 NOTE — Assessment & Plan Note (Signed)
Pending urine culture results. If symptoms worsen despite diflucan. Then start cipro 250 mg twice daily for 3 days.

## 2015-06-05 LAB — URINE CYTOLOGY ANCILLARY ONLY
CHLAMYDIA, DNA PROBE: NEGATIVE
Neisseria Gonorrhea: NEGATIVE
Trichomonas: NEGATIVE

## 2015-06-05 LAB — URINE CULTURE

## 2015-06-06 LAB — URINE CYTOLOGY ANCILLARY ONLY
BACTERIAL VAGINITIS: NEGATIVE
Candida vaginitis: NEGATIVE

## 2015-08-12 ENCOUNTER — Encounter: Payer: Self-pay | Admitting: Internal Medicine

## 2015-08-16 LAB — HM MAMMOGRAPHY: HM MAMMO: NORMAL (ref 0–4)

## 2015-08-22 ENCOUNTER — Encounter: Payer: Self-pay | Admitting: Family Medicine

## 2015-08-22 ENCOUNTER — Ambulatory Visit (INDEPENDENT_AMBULATORY_CARE_PROVIDER_SITE_OTHER): Payer: BC Managed Care – PPO | Admitting: Family Medicine

## 2015-08-22 VITALS — BP 140/80 | HR 86 | Temp 98.1°F | Resp 16 | Ht 65.0 in | Wt 222.2 lb

## 2015-08-22 DIAGNOSIS — I10 Essential (primary) hypertension: Secondary | ICD-10-CM

## 2015-08-22 NOTE — Progress Notes (Signed)
Pre visit review using our clinic review tool, if applicable. No additional management support is needed unless otherwise documented below in the visit note. 

## 2015-08-22 NOTE — Patient Instructions (Signed)
Schedule your complete physical in 6 months We'll notify you of your lab results and make any changes if needed Keep up the good work on healthy diet and try and get regular exercise Call with any questions or concerns Have a great fall season!!!

## 2015-08-22 NOTE — Progress Notes (Signed)
   Subjective:    Patient ID: Lillias Difrancesco, female    DOB: 09-06-1956, 59 y.o.   MRN: [id number]  HPI HTN- chronic problem, on Valsartan 320mg  daily.  Not taking HCTZ as directed- pt reports she felt 'weak and washed out' when taking meds.  No CP, SOB, HAs, visual changes, edema.  Very active but no formal exercise.  Pt is trying to be mindful of sodium.   Review of Systems For ROS see HPI     Objective:   Physical Exam  Constitutional: She is oriented to person, place, and time. She appears well-developed and well-nourished. No distress.  HENT:  Head: Normocephalic and atraumatic.  Eyes: Conjunctivae and EOM are normal. Pupils are equal, round, and reactive to light.  Neck: Normal range of motion. Neck supple. No thyromegaly present.  Cardiovascular: Normal rate, regular rhythm, normal heart sounds and intact distal pulses.   No murmur heard. Pulmonary/Chest: Effort normal and breath sounds normal. No respiratory distress.  Abdominal: Soft. She exhibits no distension. There is no tenderness.  Musculoskeletal: She exhibits no edema.  Lymphadenopathy:    She has no cervical adenopathy.  Neurological: She is alert and oriented to person, place, and time.  Skin: Skin is warm and dry.  Psychiatric: She has a normal mood and affect. Her behavior is normal.  Vitals reviewed.         Assessment & Plan:

## 2015-08-23 ENCOUNTER — Encounter: Payer: Self-pay | Admitting: General Practice

## 2015-08-23 LAB — BASIC METABOLIC PANEL
BUN: 14 mg/dL (ref 6–23)
CALCIUM: 9.2 mg/dL (ref 8.4–10.5)
CO2: 29 meq/L (ref 19–32)
CREATININE: 0.71 mg/dL (ref 0.40–1.20)
Chloride: 103 mEq/L (ref 96–112)
GFR: 89.63 mL/min (ref >60.00)
Glucose, Bld: 85 mg/dL (ref 70–99)
Potassium: 4.1 mEq/L (ref 3.5–5.1)
Sodium: 137 mEq/L (ref 135–145)

## 2015-08-25 NOTE — Assessment & Plan Note (Signed)
Chronic problem.  Adequate control on Valsartan.  Pt did not do well on HCTZ- fatigue.  Check BMP.  No anticipated med changes.  Will follow.

## 2015-09-14 ENCOUNTER — Other Ambulatory Visit: Payer: Self-pay | Admitting: Obstetrics and Gynecology

## 2015-09-17 LAB — CYTOLOGY - PAP

## 2015-09-18 ENCOUNTER — Other Ambulatory Visit: Payer: Self-pay | Admitting: Family Medicine

## 2015-09-19 NOTE — Telephone Encounter (Signed)
Medication filled to pharmacy as requested.   

## 2015-12-24 ENCOUNTER — Encounter: Payer: Self-pay | Admitting: Family Medicine

## 2015-12-24 ENCOUNTER — Ambulatory Visit (INDEPENDENT_AMBULATORY_CARE_PROVIDER_SITE_OTHER): Payer: BC Managed Care – PPO | Admitting: Family Medicine

## 2015-12-24 VITALS — BP 149/97 | HR 111 | Temp 98.0°F | Ht 65.0 in | Wt 218.2 lb

## 2015-12-24 DIAGNOSIS — J0100 Acute maxillary sinusitis, unspecified: Secondary | ICD-10-CM | POA: Diagnosis not present

## 2015-12-24 DIAGNOSIS — M5416 Radiculopathy, lumbar region: Secondary | ICD-10-CM | POA: Diagnosis not present

## 2015-12-24 MED ORDER — MELOXICAM 15 MG PO TABS: 15.0000 mg | ORAL_TABLET | Freq: Every day | ORAL | Status: DC

## 2015-12-24 MED ORDER — AMOXICILLIN 875 MG PO TABS: 875.0000 mg | ORAL_TABLET | Freq: Two times a day (BID) | ORAL | Status: DC

## 2015-12-24 NOTE — Progress Notes (Signed)
   Subjective:    Patient ID: Diana Vincent, female    DOB: 22-Jul-1956, 60 y.o.   MRN: [id number]  HPI Sore throat- sxs started 3 days ago.  Pt reports sinus congestion, 'like my head's in a vice'.  + PND.  No fevers.  + nausea, no vomiting.  No tooth pain.  L ear pain.  + sick contacts.  + laryngitis.  Back pain- pt reports she has a difficult time straightening upright after standing.  Pain will radiate into L buttock.  Has sensation of leg weakness.  Has to lead w/ R leg when doing stairs.  Pain w/ extension more than flexion.  sxs are intermittent.  Wearing sneakers or Danskos most days.  Pt reports she has altered her walk due to sxs.  sxs started in October.  Some improvement w/ ibuprofen.   Review of Systems For ROS see HPI     Objective:   Physical Exam  Constitutional: She is oriented to person, place, and time. She appears well-developed and well-nourished. No distress.  HENT:  Head: Normocephalic and atraumatic.  Right Ear: Tympanic membrane normal.  Left Ear: Tympanic membrane is bulging. A middle ear effusion is present.  Nose: Mucosal edema and rhinorrhea present. Right sinus exhibits maxillary sinus tenderness and frontal sinus tenderness. Left sinus exhibits maxillary sinus tenderness and frontal sinus tenderness.  Mouth/Throat: Uvula is midline and mucous membranes are normal. Posterior oropharyngeal erythema present. No oropharyngeal exudate.  Eyes: Conjunctivae and EOM are normal. Pupils are equal, round, and reactive to light.  Neck: Normal range of motion. Neck supple.  Cardiovascular: Normal rate, regular rhythm, normal heart sounds and intact distal pulses.   Pulmonary/Chest: Effort normal and breath sounds normal. No respiratory distress. She has no wheezes.  Musculoskeletal: She exhibits no tenderness (no TTP over lumbosacral spine or paraspinal muscle).  Lymphadenopathy:    She has no cervical adenopathy.  Neurological: She is alert and oriented to person, place,  and time. She has normal reflexes.  (-) SLR bilaterally  Vitals reviewed.         Assessment & Plan:

## 2015-12-24 NOTE — Patient Instructions (Signed)
Follow up as needed/scheduled Start the Amoxicillin twice daily- take w/ food Drink plenty of fluids Start the Mobic once daily x7-10 days and then as needed- take w/ food- for inflammation Alternate ice/heat for back pain We'll call you with your physical therapy appts Call with any questions or concerns Hang in there!!!

## 2015-12-24 NOTE — Progress Notes (Signed)
Pre visit review using our clinic review tool, if applicable. No additional management support is needed unless otherwise documented below in the visit note. 

## 2015-12-27 NOTE — Assessment & Plan Note (Signed)
Pt's sxs and PE consistent w/ infxn.  Start abx.  Reviewed supportive care and red flags that should prompt return.  Pt expressed understanding and is in agreement w/ plan.  

## 2015-12-27 NOTE — Assessment & Plan Note (Signed)
New.  Pt's sxs consistent w/ dx.  Start scheduled NSAIDs.  Refer to PT.  No bony TTP on PE.  If no improvement, will need referral to Ortho.  Reviewed supportive care and red flags that should prompt return.  Pt expressed understanding and is in agreement w/ plan.

## 2016-01-03 ENCOUNTER — Ambulatory Visit: Payer: BC Managed Care – PPO | Admitting: Physical Therapy

## 2016-03-07 ENCOUNTER — Ambulatory Visit (INDEPENDENT_AMBULATORY_CARE_PROVIDER_SITE_OTHER): Payer: BC Managed Care – PPO | Admitting: Family Medicine

## 2016-03-07 ENCOUNTER — Encounter: Payer: Self-pay | Admitting: Family Medicine

## 2016-03-07 VITALS — BP 132/86 | HR 89 | Temp 98.4°F | Resp 17 | Wt 213.1 lb

## 2016-03-07 DIAGNOSIS — I10 Essential (primary) hypertension: Secondary | ICD-10-CM

## 2016-03-07 DIAGNOSIS — M25561 Pain in right knee: Secondary | ICD-10-CM | POA: Diagnosis not present

## 2016-03-07 LAB — CBC WITH DIFFERENTIAL/PLATELET
Basophils Absolute: 0 10*3/uL (ref 0.0–0.1)
Basophils Relative: 0 % (ref 0–1)
Eosinophils Absolute: 0.1 10*3/uL (ref 0.0–0.7)
Eosinophils Relative: 1 % (ref 0–5)
HEMATOCRIT: 44.3 % (ref 36.0–46.0)
HEMOGLOBIN: 14.9 g/dL (ref 12.0–15.0)
LYMPHS ABS: 1.7 10*3/uL (ref 0.7–4.0)
Lymphocytes Relative: 23 % (ref 12–46)
MCH: 30.9 pg (ref 26.0–34.0)
MCHC: 33.6 g/dL (ref 30.0–36.0)
MCV: 91.9 fL (ref 78.0–100.0)
MONOS PCT: 9 % (ref 3–12)
MPV: 10.1 fL (ref 8.6–12.4)
Monocytes Absolute: 0.7 10*3/uL (ref 0.1–1.0)
NEUTROS ABS: 5.1 10*3/uL (ref 1.7–7.7)
NEUTROS PCT: 67 % (ref 43–77)
Platelets: 358 10*3/uL (ref 150–400)
RBC: 4.82 MIL/uL (ref 3.87–5.11)
RDW: 13.5 % (ref 11.5–15.5)
WBC: 7.6 10*3/uL (ref 4.0–10.5)

## 2016-03-07 LAB — TSH: TSH: 1.83 mIU/L

## 2016-03-07 MED ORDER — MELOXICAM 15 MG PO TABS: 15.0000 mg | ORAL_TABLET | Freq: Every day | ORAL | Status: DC

## 2016-03-07 MED ORDER — VALSARTAN 320 MG PO TABS: 320.0000 mg | ORAL_TABLET | Freq: Every day | ORAL | Status: DC

## 2016-03-07 NOTE — Patient Instructions (Signed)
Schedule your complete physical in 6 months We'll notify you of your lab results and make any changes if needed We'll call you with your ortho appt for the knee pain Restart the Mobic once daily- take w/ food Continue to work on healthy diet and regular exercise- you can do it! Call with any questions or concerns Hang in there!!!

## 2016-03-07 NOTE — Progress Notes (Signed)
Pre visit review using our clinic review tool, if applicable. No additional management support is needed unless otherwise documented below in the visit note. 

## 2016-03-07 NOTE — Progress Notes (Signed)
   Subjective:    Patient ID: Diana Vincent, female    DOB: 1956/12/02, 60 y.o.   MRN: [id number]  HPI Knee pain- ongoing issue bilaterally but currently R knee is very painful.  Pain is described as constant, throbbing and will radiate into shin.  sxs started 5-6 days ago.  No change in activity level.  No known injury.  Not currently swollen.  No obvious redness or warmth.  Not currently on Mobic- ran out.  No relief w/ OTC anti-inflammatories.  HTN- chronic problem, on Diovan w/ adequate control.  No CP, SOB, HAs, visual changes.   Review of Systems For ROS see HPI     Objective:   Physical Exam  Constitutional: She is oriented to person, place, and time. She appears well-developed and well-nourished. No distress.  HENT:  Head: Normocephalic and atraumatic.  Eyes: Conjunctivae and EOM are normal. Pupils are equal, round, and reactive to light.  Neck: Normal range of motion. Neck supple. No thyromegaly present.  Cardiovascular: Normal rate, regular rhythm, normal heart sounds and intact distal pulses.   No murmur heard. Pulmonary/Chest: Effort normal and breath sounds normal. No respiratory distress.  Abdominal: Soft. She exhibits no distension. There is no tenderness.  Musculoskeletal: She exhibits tenderness (R medial joint line tenderness). She exhibits no edema.  No crepitus w/ flexion/extension  Lymphadenopathy:    She has no cervical adenopathy.  Neurological: She is alert and oriented to person, place, and time.  Skin: Skin is warm and dry.  Psychiatric: She has a normal mood and affect. Her behavior is normal.  Vitals reviewed.         Assessment & Plan:

## 2016-03-08 LAB — LIPID PANEL
CHOL/HDL RATIO: 3 ratio (ref <=5.0)
Cholesterol: 96 mg/dL — ABNORMAL LOW (ref 125–200)
HDL: 32 mg/dL — ABNORMAL LOW (ref >=46)
LDL CALC: 41 mg/dL (ref <130)
Triglycerides: 113 mg/dL (ref <150)
VLDL: 23 mg/dL (ref <30)

## 2016-03-08 LAB — HEPATIC FUNCTION PANEL
ALK PHOS: 39 U/L (ref 33–130)
ALT: 17 U/L (ref 6–29)
AST: 16 U/L (ref 10–35)
Albumin: 3.9 g/dL (ref 3.6–5.1)
BILIRUBIN DIRECT: 0.1 mg/dL (ref <=0.2)
BILIRUBIN INDIRECT: 0.3 mg/dL (ref 0.2–1.2)
TOTAL PROTEIN: 6.3 g/dL (ref 6.1–8.1)
Total Bilirubin: 0.4 mg/dL (ref 0.2–1.2)

## 2016-03-08 LAB — BASIC METABOLIC PANEL
BUN: 12 mg/dL (ref 7–25)
CALCIUM: 8.8 mg/dL (ref 8.6–10.4)
CO2: 26 mmol/L (ref 20–31)
CREATININE: 0.69 mg/dL (ref 0.50–1.05)
Chloride: 101 mmol/L (ref 98–110)
GLUCOSE: 84 mg/dL (ref 65–99)
Potassium: 4.1 mmol/L (ref 3.5–5.3)
SODIUM: 140 mmol/L (ref 135–146)

## 2016-03-08 LAB — URIC ACID: URIC ACID, SERUM: 5.8 mg/dL (ref 2.4–7.0)

## 2016-03-09 NOTE — Assessment & Plan Note (Signed)
New.  Restart Mobic.  Refer to ortho for complete evaluation and tx.  Pt expressed understanding and is in agreement w/ plan.

## 2016-03-09 NOTE — Assessment & Plan Note (Signed)
Chronic problem.  BP well controlled.  Asymptomatic.  Check labs.  No anticipated med changes. 

## 2016-03-14 ENCOUNTER — Other Ambulatory Visit: Payer: Self-pay | Admitting: Family Medicine

## 2016-04-04 ENCOUNTER — Ambulatory Visit (INDEPENDENT_AMBULATORY_CARE_PROVIDER_SITE_OTHER): Payer: BC Managed Care – PPO | Admitting: Family Medicine

## 2016-04-04 ENCOUNTER — Encounter: Payer: Self-pay | Admitting: Family Medicine

## 2016-04-04 VITALS — BP 156/96 | HR 96 | Temp 98.1°F | Resp 16 | Ht 65.0 in | Wt 211.4 lb

## 2016-04-04 DIAGNOSIS — I10 Essential (primary) hypertension: Secondary | ICD-10-CM

## 2016-04-04 DIAGNOSIS — F418 Other specified anxiety disorders: Secondary | ICD-10-CM | POA: Diagnosis not present

## 2016-04-04 NOTE — Progress Notes (Signed)
   Subjective:    Patient ID: Charlann Buitrago, female    DOB: [birth year], 60 y.o.   MRN: [id number]  HPI Anxiety/depression- chronic problem.  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  Currently on Lexapro and Lamictal.  'I don't like the person I've become in the classroom with them'.  HTN- chronic problem, on Valsartan 320mg  daily.  Having bad HAs.  Pt aware that this is stress related   Review of Systems For ROS see HPI     Objective:   Physical Exam  Constitutional: She is oriented to person, place, and time. She appears well-developed and well-nourished.  tearful  HENT:  Head: Normocephalic and atraumatic.  Neurological: She is alert and oriented to person, place, and time.  Psychiatric: Her behavior is normal. Thought content normal.  Very anxious, tearful  Vitals reviewed.         Assessment & Plan:

## 2016-04-04 NOTE — Patient Instructions (Signed)
Follow up in 2 weeks to recheck BP and mood We won't address BP meds today b/c I think it is falsely elevated today You are going to stay out of work for at least the next 2 weeks Call with any questions or concerns Hang in there!!!

## 2016-04-04 NOTE — Progress Notes (Signed)
Pre visit review using our clinic review tool, if applicable. No additional management support is needed unless otherwise documented below in the visit note. 

## 2016-04-06 ENCOUNTER — Encounter: Payer: Self-pay | Admitting: Family Medicine

## 2016-04-06 NOTE — Assessment & Plan Note (Signed)
Deteriorated.  I suspect this is all due to stress.  No medication changes at this time but we will reassess in 2 weeks and determine if additional medications are needed

## 2016-04-06 NOTE — Assessment & Plan Note (Signed)
Deteriorated.  Pt reports she has an appt w/ psych next week and she had her meds changed ~1 month ago but cannot recall what she's currently taking.  At this time, it is clear that her work stress is now causing her physical issues as evidenced by her elevated BP.  I agree that the best steps would be to take 2 weeks off work and reassess.  Pt expressed understanding and is in agreement w/ plan.

## 2016-04-16 ENCOUNTER — Telehealth: Payer: Self-pay | Admitting: Family Medicine

## 2016-04-16 MED ORDER — AMLODIPINE BESYLATE 5 MG PO TABS: 5.0000 mg | ORAL_TABLET | Freq: Every day | ORAL | Status: DC

## 2016-04-16 NOTE — Telephone Encounter (Signed)
Received a fax w/ pt's recent BP readings.  They have been ranging from 144-175/89-112.  Based on this, we are going to add Amlodipine 5mg  daily to her regular Valsartan.  I would like her to follow up as scheduled on Friday.  I sent the prescription to the pharmacy.  Please notify her of the change

## 2016-04-16 NOTE — Telephone Encounter (Signed)
Pt notified of changes and expressed an understanding.

## 2016-04-17 DIAGNOSIS — Z7689 Persons encountering health services in other specified circumstances: Secondary | ICD-10-CM

## 2016-04-18 ENCOUNTER — Ambulatory Visit (INDEPENDENT_AMBULATORY_CARE_PROVIDER_SITE_OTHER): Payer: BC Managed Care – PPO | Admitting: Family Medicine

## 2016-04-18 ENCOUNTER — Encounter: Payer: Self-pay | Admitting: Family Medicine

## 2016-04-18 VITALS — BP 150/87 | HR 82 | Temp 98.6°F | Resp 16 | Ht 65.0 in | Wt 210.1 lb

## 2016-04-18 DIAGNOSIS — B369 Superficial mycosis, unspecified: Secondary | ICD-10-CM | POA: Diagnosis not present

## 2016-04-18 DIAGNOSIS — I10 Essential (primary) hypertension: Secondary | ICD-10-CM | POA: Diagnosis not present

## 2016-04-18 MED ORDER — CLOTRIMAZOLE-BETAMETHASONE 1-0.05 % EX CREA: 1.0000 "application " | TOPICAL_CREAM | Freq: Two times a day (BID) | CUTANEOUS | Status: DC

## 2016-04-18 MED ORDER — VALSARTAN-HYDROCHLOROTHIAZIDE 320-12.5 MG PO TABS: 1.0000 | ORAL_TABLET | Freq: Every day | ORAL | Status: DC

## 2016-04-18 NOTE — Patient Instructions (Signed)
Follow up in 1 month to recheck blood pressure Start the Valsartan HCTZ combo medication in addition to the Amlodipine daily Use the Lotrisone cream twice daily on the raw area Call with any questions or concerns Hang in there!!!  You're doing great!!!

## 2016-04-18 NOTE — Assessment & Plan Note (Addendum)
Chronic problem.  BP is better controlled w/ addition of Amlodipine to her Valsartan.  Still room for improvement.  Will attempt to restart HCTZ in combo w/ Valsartan.  Pt has had difficulty w/ this in the past- it caused fatigue- but we'll see if the results are better this time around.  Pt expressed understanding and is in agreement w/ plan.

## 2016-04-18 NOTE — Assessment & Plan Note (Signed)
New.  Start Lotrisone topical twice daily.  Reviewed supportive care and red flags that should prompt return.  Pt expressed understanding and is in agreement w/ plan.

## 2016-04-18 NOTE — Progress Notes (Signed)
Pre visit review using our clinic review tool, if applicable. No additional management support is needed unless otherwise documented below in the visit note. 

## 2016-04-18 NOTE — Progress Notes (Signed)
   Subjective:    Patient ID: Diana Vincent, female    DOB: 04-01-1956, 59 y.o.   MRN: [id number]  HPI HTN- chronic problem, currently on Valsartan and Amlodipine (just started 2 days ago).  No side effects from medication.  No longer on Vyvanse.  Stopped Lorazepam at night.  Stress has improved after being home x2 weeks.  Asking for another week out of work while we adjust BP meds.   HAs are improving.  No CP, SOB, visual changes.  Rash on buttock- pt reports she has been outside gardening, sweating and in the last 2 days has a burning, itching discomfort in her gluteal crease w/ redness and rash.  Pt feels this is consistent w/ yeast.   Review of Systems For ROS see HPI     Objective:   Physical Exam  Constitutional: She is oriented to person, place, and time. She appears well-developed and well-nourished. No distress.  HENT:  Head: Normocephalic and atraumatic.  Eyes: Conjunctivae and EOM are normal. Pupils are equal, round, and reactive to light.  Neck: Normal range of motion. Neck supple. No thyromegaly present.  Cardiovascular: Normal rate, regular rhythm, normal heart sounds and intact distal pulses.   No murmur heard. Pulmonary/Chest: Effort normal and breath sounds normal. No respiratory distress.  Abdominal: Soft. She exhibits no distension. There is no tenderness.  Musculoskeletal: She exhibits no edema.  Lymphadenopathy:    She has no cervical adenopathy.  Neurological: She is alert and oriented to person, place, and time.  Skin: Skin is warm and dry. There is erythema (redness of gluteal crease w/ itching.  satellite lesions present consistent w/ yeast).  Psychiatric: She has a normal mood and affect. Her behavior is normal.  Vitals reviewed.         Assessment & Plan:

## 2016-04-23 ENCOUNTER — Telehealth: Payer: Self-pay | Admitting: Family Medicine

## 2016-04-23 NOTE — Telephone Encounter (Signed)
Pt called checking status on FMLA paperwork. Pt states that forms should state that she is able to return to work on May 15th with no restrictions. Pt would like a call when forms have been faxed

## 2016-04-23 NOTE — Telephone Encounter (Signed)
Ok to fax-

## 2016-04-24 NOTE — Telephone Encounter (Signed)
Paperwork was faxed on 04/23/16.

## 2016-05-01 ENCOUNTER — Other Ambulatory Visit: Payer: Self-pay | Admitting: Family Medicine

## 2016-05-02 NOTE — Telephone Encounter (Signed)
Medication filled to pharmacy as requested.   

## 2016-05-19 ENCOUNTER — Encounter: Payer: Self-pay | Admitting: Family Medicine

## 2016-05-19 ENCOUNTER — Ambulatory Visit (INDEPENDENT_AMBULATORY_CARE_PROVIDER_SITE_OTHER): Payer: BC Managed Care – PPO | Admitting: Family Medicine

## 2016-05-19 VITALS — BP 133/81 | HR 93 | Temp 98.0°F | Resp 16 | Ht 65.0 in | Wt 212.0 lb

## 2016-05-19 DIAGNOSIS — I10 Essential (primary) hypertension: Secondary | ICD-10-CM

## 2016-05-19 NOTE — Progress Notes (Signed)
   Subjective:    Patient ID: Diana Vincent, female    DOB: 31-Dec-1955, 60 y.o.   MRN: [id number]  HPI HTN- chronic problem.  BP is much better today since adding the HCTZ to the Valsartan in addition to her Amlodipine.  Pt reports feeling better since BP has come down.  HAs have improved.  No CP, palpitations.  No blurry or double vision.  No edema.   Review of Systems For ROS see HPI     Objective:   Physical Exam  Constitutional: She is oriented to person, place, and time. She appears well-developed and well-nourished. No distress.  HENT:  Head: Normocephalic and atraumatic.  Eyes: Conjunctivae and EOM are normal. Pupils are equal, round, and reactive to light.  Neck: Normal range of motion. Neck supple. No thyromegaly present.  Cardiovascular: Normal rate, regular rhythm, normal heart sounds and intact distal pulses.   No murmur heard. Pulmonary/Chest: Effort normal and breath sounds normal. No respiratory distress.  Abdominal: Soft. She exhibits no distension. There is no tenderness.  Musculoskeletal: She exhibits no edema.  Lymphadenopathy:    She has no cervical adenopathy.  Neurological: She is alert and oriented to person, place, and time.  Skin: Skin is warm and dry.  Psychiatric: She has a normal mood and affect. Her behavior is normal.  Vitals reviewed.         Assessment & Plan:

## 2016-05-19 NOTE — Progress Notes (Signed)
Pre visit review using our clinic review tool, if applicable. No additional management support is needed unless otherwise documented below in the visit note. 

## 2016-05-19 NOTE — Patient Instructions (Signed)
Schedule your complete physical in 3-4 months We'll notify you of your lab results and make any changes if needed (I don't expect this) Continue the Valsartan HCTZ and amlodipine daily- your blood pressure looks great! Call with any questions or concerns Have a great summer!!!

## 2016-05-19 NOTE — Assessment & Plan Note (Signed)
BP much improved today since adding HCTZ to regimen.  Asymptomatic.  Check BMP.  No anticipated med changes.  Will follow.

## 2016-05-20 LAB — BASIC METABOLIC PANEL
BUN: 22 mg/dL (ref 7–25)
CALCIUM: 8.9 mg/dL (ref 8.6–10.4)
CO2: 28 mmol/L (ref 20–31)
CREATININE: 0.82 mg/dL (ref 0.50–1.05)
Chloride: 99 mmol/L (ref 98–110)
GLUCOSE: 53 mg/dL — AB (ref 65–99)
POTASSIUM: 3.8 mmol/L (ref 3.5–5.3)
Sodium: 140 mmol/L (ref 135–146)

## 2016-08-07 ENCOUNTER — Other Ambulatory Visit: Payer: Self-pay | Admitting: Family Medicine

## 2016-08-07 NOTE — Telephone Encounter (Signed)
Last OV 05/19/16 meloxicam last filled 05/02/16 330 with 2

## 2016-09-26 ENCOUNTER — Other Ambulatory Visit: Payer: Self-pay | Admitting: Obstetrics and Gynecology

## 2016-09-29 LAB — HM MAMMOGRAPHY: HM Mammogram: NORMAL (ref 0–4)

## 2016-10-02 ENCOUNTER — Other Ambulatory Visit: Payer: Self-pay | Admitting: Family Medicine

## 2016-10-30 ENCOUNTER — Ambulatory Visit (INDEPENDENT_AMBULATORY_CARE_PROVIDER_SITE_OTHER): Payer: BC Managed Care – PPO | Admitting: Family Medicine

## 2016-10-30 ENCOUNTER — Encounter: Payer: Self-pay | Admitting: Family Medicine

## 2016-10-30 VITALS — BP 126/84 | HR 80 | Temp 98.1°F | Resp 16 | Ht 65.0 in | Wt 223.4 lb

## 2016-10-30 DIAGNOSIS — R300 Dysuria: Secondary | ICD-10-CM

## 2016-10-30 DIAGNOSIS — R1030 Lower abdominal pain, unspecified: Secondary | ICD-10-CM

## 2016-10-30 DIAGNOSIS — R319 Hematuria, unspecified: Secondary | ICD-10-CM

## 2016-10-30 LAB — POCT URINALYSIS DIPSTICK
Bilirubin, UA: NEGATIVE
GLUCOSE UA: NEGATIVE
Ketones, UA: NEGATIVE
LEUKOCYTES UA: NEGATIVE
Nitrite, UA: NEGATIVE
Protein, UA: NEGATIVE
SPEC GRAV UA: 1.025
UROBILINOGEN UA: 0.2
pH, UA: 5

## 2016-10-30 MED ORDER — CEPHALEXIN 500 MG PO CAPS: 500.0000 mg | ORAL_CAPSULE | Freq: Two times a day (BID) | ORAL | 0 refills | Status: AC

## 2016-10-30 NOTE — Patient Instructions (Signed)
Follow up as needed/scheduled Start the Keflex twice daily Drink plenty of fluids Call with any questions or concerns Hang in there!!! Happy Holidays!!!

## 2016-10-30 NOTE — Progress Notes (Signed)
Pre visit review using our clinic review tool, if applicable. No additional management support is needed unless otherwise documented below in the visit note. 

## 2016-10-30 NOTE — Assessment & Plan Note (Signed)
Pt's sxs are consistent w/ UTI.  Start Keflex.  Send urine for culture.  Reviewed supportive care and red flags that should prompt return.  Pt expressed understanding and is in agreement w/ plan.

## 2016-10-30 NOTE — Progress Notes (Signed)
   Subjective:    Patient ID: Diana Vincent, female    DOB: [birth year], 60 y.o.   MRN: [id number]  HPI Dysuria- sxs started 4 days ago w/ burning w/ urination, frequency, urgency, suprapubic pressure, LBP.  No fevers or chills.  No hematuria.  No CVA tenderness.  + sensation of incomplete emptying   Review of Systems For ROS see HPI     Objective:   Physical Exam  Constitutional: She is oriented to person, place, and time. She appears well-developed and well-nourished. No distress.  HENT:  Head: Normocephalic and atraumatic.  Eyes: Conjunctivae and EOM are normal. Pupils are equal, round, and reactive to light.  Abdominal: Soft. Bowel sounds are normal. She exhibits no distension. There is tenderness (+ suprapubic TTP, no CVA tenderness).  Neurological: She is alert and oriented to person, place, and time.  Skin: Skin is warm and dry.  Psychiatric: She has a normal mood and affect. Her behavior is normal. Thought content normal.  Vitals reviewed.         Assessment & Plan:

## 2016-10-30 NOTE — Addendum Note (Signed)
Addended by: Katina Dung on: 10/30/2016 04:12 PM   Modules accepted: Orders

## 2016-10-31 LAB — URINE CULTURE

## 2016-11-03 ENCOUNTER — Other Ambulatory Visit: Payer: Self-pay | Admitting: Family Medicine

## 2016-11-30 ENCOUNTER — Other Ambulatory Visit: Payer: Self-pay | Admitting: Family Medicine

## 2016-12-15 ENCOUNTER — Encounter: Payer: Self-pay | Admitting: Family Medicine

## 2016-12-18 ENCOUNTER — Encounter: Payer: Self-pay | Admitting: Family Medicine

## 2016-12-18 ENCOUNTER — Ambulatory Visit (INDEPENDENT_AMBULATORY_CARE_PROVIDER_SITE_OTHER): Payer: BC Managed Care – PPO | Admitting: Family Medicine

## 2016-12-18 VITALS — BP 124/83 | HR 80 | Temp 98.2°F | Resp 16 | Ht 65.0 in | Wt 223.0 lb

## 2016-12-18 DIAGNOSIS — R319 Hematuria, unspecified: Secondary | ICD-10-CM | POA: Diagnosis not present

## 2016-12-18 DIAGNOSIS — R350 Frequency of micturition: Secondary | ICD-10-CM | POA: Diagnosis not present

## 2016-12-18 LAB — POCT URINALYSIS DIPSTICK
BILIRUBIN UA: NEGATIVE
GLUCOSE UA: NEGATIVE
Ketones, UA: NEGATIVE
LEUKOCYTES UA: NEGATIVE
NITRITE UA: NEGATIVE
Protein, UA: NEGATIVE
Spec Grav, UA: 1.025
Urobilinogen, UA: 0.2
pH, UA: 5

## 2016-12-18 MED ORDER — CEPHALEXIN 500 MG PO CAPS: 500.0000 mg | ORAL_CAPSULE | Freq: Two times a day (BID) | ORAL | 0 refills | Status: AC

## 2016-12-18 MED ORDER — SULFAMETHOXAZOLE-TRIMETHOPRIM 800-160 MG PO TABS: 1.0000 | ORAL_TABLET | Freq: Two times a day (BID) | ORAL | 0 refills | Status: DC

## 2016-12-18 NOTE — Patient Instructions (Signed)
Follow up as needed/scheduled Start the Keflex as directed- take w/ food Drink plenty of fluids If no improvement, let me know and we'll refer to urology Hang in there!!! Happy New Year!!!

## 2016-12-18 NOTE — Progress Notes (Signed)
   Subjective:    Patient ID: Diana Vincent, female    DOB: 1956/05/30, 61 y.o.   MRN: [id number]  HPI Urinary frequency- pt was here in November and at that point she had sxs for ~1 month.  She reports sxs are worsening.  Pt is having suparpubic pressure and is urinating 10-12x/day.  Has sensation of incomplete emptying, some 'trickling' and she now has to wear a pad daily.  Pt is concerned that her HCTZ is causing issues.  Pt has hx of bladder tack and does not feel that anything has changed- had recent GYN appt.     Review of Systems For ROS see HPI     Objective:   Physical Exam  Constitutional: She is oriented to person, place, and time. She appears well-developed and well-nourished. No distress.  Abdominal: Soft. She exhibits no distension. There is tenderness (+ suprapubic but no CVA tenderness ).  Neurological: She is alert and oriented to person, place, and time.  Skin: Skin is warm and dry.  Psychiatric: She has a normal mood and affect. Her behavior is normal. Thought content normal.  Vitals reviewed.         Assessment & Plan:  Urinary frequency- pt's sxs are highly suspicious for UTI.  Start Keflex.  Send for culture.  If no improvement in sxs or cx negative- refer to urology.

## 2016-12-18 NOTE — Progress Notes (Signed)
Pre visit review using our clinic review tool, if applicable. No additional management support is needed unless otherwise documented below in the visit note. 

## 2016-12-19 LAB — URINE CULTURE

## 2016-12-22 ENCOUNTER — Ambulatory Visit: Payer: Self-pay | Admitting: Family Medicine

## 2017-02-19 ENCOUNTER — Encounter: Payer: Self-pay | Admitting: Family Medicine

## 2017-02-19 ENCOUNTER — Ambulatory Visit (INDEPENDENT_AMBULATORY_CARE_PROVIDER_SITE_OTHER): Payer: BC Managed Care – PPO | Admitting: Family Medicine

## 2017-02-19 VITALS — BP 130/92 | HR 92 | Temp 97.9°F | Resp 16 | Ht 65.0 in | Wt 222.1 lb

## 2017-02-19 DIAGNOSIS — N3281 Overactive bladder: Secondary | ICD-10-CM | POA: Diagnosis not present

## 2017-02-19 DIAGNOSIS — J0110 Acute frontal sinusitis, unspecified: Secondary | ICD-10-CM

## 2017-02-19 MED ORDER — MIRABEGRON ER 25 MG PO TB24: 25.0000 mg | ORAL_TABLET | Freq: Every day | ORAL | 3 refills | Status: DC

## 2017-02-19 MED ORDER — AMOXICILLIN 875 MG PO TABS: 875.0000 mg | ORAL_TABLET | Freq: Two times a day (BID) | ORAL | 0 refills | Status: DC

## 2017-02-19 NOTE — Patient Instructions (Signed)
Please schedule your complete physical in 3 months Start the Amoxicillin twice daily- take w/ food Drink plenty of fluids Mucinex DM for cough/congestion REST! Once you are done with the antibiotics, start the Myrbetriq for overactive bladder Call with any questions or concerns Hang in there!!!

## 2017-02-19 NOTE — Assessment & Plan Note (Signed)
New.  Pt's sxs are consistent w OAB and not stress incontinence.  Start Mybetriq.  If no improvement, will refer to urology.  Pt expressed understanding and is in agreement w/ plan.

## 2017-02-19 NOTE — Progress Notes (Signed)
   Subjective:    Patient ID: Diana Vincent, female    DOB: December 09, 1956, 61 y.o.   MRN: [id number]  HPI URI- sxs started 3 days ago.  Yesterday had severe sinus pain, increased snoring.  + HA, dizziness.  No ear pain.  Yesterday had sore throat.  + nasal congestion.  Minimal cough.  + tooth pain.  No N/V/D.  + sick contacts.  OAB- pt reports she is always needing to use the bathroom.  Not having leaking w/ cough, sneeze, exercise, etc.     Review of Systems For ROS see HPI     Objective:   Physical Exam  Constitutional: She appears well-developed and well-nourished. No distress.  HENT:  Head: Normocephalic and atraumatic.  Right Ear: Tympanic membrane normal.  Left Ear: Tympanic membrane normal.  Nose: Mucosal edema and rhinorrhea present. Right sinus exhibits frontal sinus tenderness. Right sinus exhibits no maxillary sinus tenderness. Left sinus exhibits frontal sinus tenderness. Left sinus exhibits no maxillary sinus tenderness.  Mouth/Throat: Uvula is midline and mucous membranes are normal. Posterior oropharyngeal erythema present. No oropharyngeal exudate.  Eyes: Conjunctivae and EOM are normal. Pupils are equal, round, and reactive to light.  Neck: Normal range of motion. Neck supple.  Cardiovascular: Normal rate, regular rhythm and normal heart sounds.   Pulmonary/Chest: Effort normal and breath sounds normal. No respiratory distress. She has no wheezes.  Lymphadenopathy:    She has no cervical adenopathy.  Vitals reviewed.         Assessment & Plan:

## 2017-02-19 NOTE — Progress Notes (Signed)
Pre visit review using our clinic review tool, if applicable. No additional management support is needed unless otherwise documented below in the visit note. 

## 2017-02-19 NOTE — Assessment & Plan Note (Signed)
Pt's sxs and PE consistent w/ infxn.  Start abx.  Reviewed supportive care and red flags that should prompt return.  Pt expressed understanding and is in agreement w/ plan.  

## 2017-03-11 ENCOUNTER — Other Ambulatory Visit: Payer: Self-pay | Admitting: Family Medicine

## 2017-06-10 ENCOUNTER — Ambulatory Visit (INDEPENDENT_AMBULATORY_CARE_PROVIDER_SITE_OTHER): Payer: BC Managed Care – PPO | Admitting: Family Medicine

## 2017-06-10 ENCOUNTER — Encounter: Payer: Self-pay | Admitting: General Practice

## 2017-06-10 ENCOUNTER — Encounter: Payer: Self-pay | Admitting: Family Medicine

## 2017-06-10 VITALS — BP 120/82 | HR 86 | Temp 98.3°F | Resp 16 | Ht 65.0 in | Wt 220.0 lb

## 2017-06-10 DIAGNOSIS — I10 Essential (primary) hypertension: Secondary | ICD-10-CM | POA: Diagnosis not present

## 2017-06-10 DIAGNOSIS — Z0000 Encounter for general adult medical examination without abnormal findings: Secondary | ICD-10-CM | POA: Diagnosis not present

## 2017-06-10 LAB — LIPID PANEL
CHOL/HDL RATIO: 3
Cholesterol: 102 mg/dL (ref 0–200)
HDL: 39.2 mg/dL (ref >39.00)
LDL CALC: 40 mg/dL (ref 0–99)
NONHDL: 62.35
Triglycerides: 113 mg/dL (ref 0.0–149.0)
VLDL: 22.6 mg/dL (ref 0.0–40.0)

## 2017-06-10 LAB — CBC WITH DIFFERENTIAL/PLATELET
BASOS PCT: 0.4 % (ref 0.0–3.0)
Basophils Absolute: 0 10*3/uL (ref 0.0–0.1)
EOS PCT: 1.7 % (ref 0.0–5.0)
Eosinophils Absolute: 0.1 10*3/uL (ref 0.0–0.7)
HCT: 47.4 % — ABNORMAL HIGH (ref 36.0–46.0)
HEMOGLOBIN: 16.1 g/dL — AB (ref 12.0–15.0)
LYMPHS ABS: 1.5 10*3/uL (ref 0.7–4.0)
Lymphocytes Relative: 17.8 % (ref 12.0–46.0)
MCHC: 34 g/dL (ref 30.0–36.0)
MCV: 94.4 fl (ref 78.0–100.0)
MONO ABS: 0.7 10*3/uL (ref 0.1–1.0)
MONOS PCT: 8.5 % (ref 3.0–12.0)
Neutro Abs: 6 10*3/uL (ref 1.4–7.7)
Neutrophils Relative %: 71.6 % (ref 43.0–77.0)
Platelets: 408 10*3/uL — ABNORMAL HIGH (ref 150.0–400.0)
RBC: 5.02 Mil/uL (ref 3.87–5.11)
RDW: 13.1 % (ref 11.5–15.5)
WBC: 8.3 10*3/uL (ref 4.0–10.5)

## 2017-06-10 LAB — BASIC METABOLIC PANEL
BUN: 11 mg/dL (ref 6–23)
CHLORIDE: 101 meq/L (ref 96–112)
CO2: 30 mEq/L (ref 19–32)
Calcium: 9.6 mg/dL (ref 8.4–10.5)
Creatinine, Ser: 0.72 mg/dL (ref 0.40–1.20)
GFR: 87.66 mL/min (ref >60.00)
Glucose, Bld: 89 mg/dL (ref 70–99)
POTASSIUM: 4.5 meq/L (ref 3.5–5.1)
Sodium: 139 mEq/L (ref 135–145)

## 2017-06-10 LAB — HEPATIC FUNCTION PANEL
ALBUMIN: 4.2 g/dL (ref 3.5–5.2)
ALK PHOS: 46 U/L (ref 39–117)
ALT: 26 U/L (ref 0–35)
AST: 18 U/L (ref 0–37)
BILIRUBIN TOTAL: 0.4 mg/dL (ref 0.2–1.2)
Bilirubin, Direct: 0.1 mg/dL (ref 0.0–0.3)
Total Protein: 6.6 g/dL (ref 6.0–8.3)

## 2017-06-10 LAB — TSH: TSH: 2.24 u[IU]/mL (ref 0.35–4.50)

## 2017-06-10 NOTE — Progress Notes (Signed)
Pre visit review using our clinic review tool, if applicable. No additional management support is needed unless otherwise documented below in the visit note. 

## 2017-06-10 NOTE — Patient Instructions (Signed)
Follow up in 6 months to recheck BP and weight loss progress We'll notify you of your lab results and make any changes if needed Continue to work on healthy diet and regular exercise- you can do it! Complete the Cologuard as directed Consider calling Dr Lenon Curt, Dr Harlow Mares, Dr Stephanie Coup for information on plastic surgery Call with any questions or concerns Have a great summer!!!

## 2017-06-10 NOTE — Assessment & Plan Note (Signed)
Pt's PE WNL w/ exception of obesity.  UTD on pap, mammo.  Due for colonoscopy- pt prefers cologuard.  Order placed.  Check labs.  Anticipatory guidance provided.

## 2017-06-10 NOTE — Assessment & Plan Note (Signed)
Chronic problem.  Adequate control today.  Asymptomatic.  Check labs.  No anticipated med changes.  Will follow. 

## 2017-06-10 NOTE — Progress Notes (Signed)
   Subjective:    Patient ID: Diana Vincent, female    DOB: 04/30/1956, 61 y.o.   MRN: [id number]  HPI CPE- UTD on pap, mammo Philis Pique).  Pt is due for colonoscopy but prefers Cologuard.   Review of Systems Patient reports no vision/ hearing changes, adenopathy,fever, weight change,  persistant/recurrent hoarseness , swallowing issues, chest pain, palpitations, edema, persistant/recurrent cough, hemoptysis, dyspnea (rest/exertional/paroxysmal nocturnal), gastrointestinal bleeding (melena, rectal bleeding), abdominal pain, significant heartburn, bowel changes, GU symptoms (dysuria, hematuria, incontinence), Gyn symptoms (abnormal  bleeding, pain),  syncope, focal weakness, memory loss, numbness & tingling, changes, abnormal bruising or bleeding, anxiety, or depression.   + dry skin, brittle nails + fatigue    Objective:   Physical Exam General Appearance:    Alert, cooperative, no distress, appears stated age  Head:    Normocephalic, without obvious abnormality, atraumatic  Eyes:    PERRL, conjunctiva/corneas clear, EOM's intact, fundi    benign, both eyes  Ears:    Normal TM's and external ear canals, both ears  Nose:   Nares normal, septum midline, mucosa normal, no drainage    or sinus tenderness  Throat:   Lips, mucosa, and tongue normal; teeth and gums normal  Neck:   Supple, symmetrical, trachea midline, no adenopathy;    Thyroid: no enlargement/tenderness/nodules  Back:     Symmetric, no curvature, ROM normal, no CVA tenderness  Lungs:     Clear to auscultation bilaterally, respirations unlabored  Chest Wall:    No tenderness or deformity   Heart:    Regular rate and rhythm, S1 and S2 normal, no murmur, rub   or gallop  Breast Exam:    Deferred to GYN  Abdomen:     Soft, non-tender, bowel sounds active all four quadrants,    no masses, no organomegaly  Genitalia:    Deferred to GYN  Rectal:    Extremities:   Extremities normal, atraumatic, no cyanosis or edema  Pulses:   2+  and symmetric all extremities  Skin:   Skin color, texture, turgor normal, no rashes or lesions  Lymph nodes:   Cervical, supraclavicular, and axillary nodes normal  Neurologic:   CNII-XII intact, normal strength, sensation and reflexes    throughout          Assessment & Plan:

## 2017-06-22 LAB — COLOGUARD

## 2017-07-06 ENCOUNTER — Other Ambulatory Visit: Payer: Self-pay | Admitting: Family Medicine

## 2017-07-07 NOTE — Telephone Encounter (Signed)
Medication filled to pharmacy as requested.   

## 2017-07-19 ENCOUNTER — Other Ambulatory Visit: Payer: Self-pay | Admitting: Family Medicine

## 2017-07-31 ENCOUNTER — Ambulatory Visit (INDEPENDENT_AMBULATORY_CARE_PROVIDER_SITE_OTHER): Payer: BC Managed Care – PPO | Admitting: Family Medicine

## 2017-07-31 ENCOUNTER — Encounter: Payer: Self-pay | Admitting: Internal Medicine

## 2017-07-31 ENCOUNTER — Encounter: Payer: Self-pay | Admitting: Family Medicine

## 2017-07-31 VITALS — BP 124/83 | HR 80 | Temp 98.1°F | Resp 16 | Ht 65.0 in | Wt 224.2 lb

## 2017-07-31 DIAGNOSIS — I10 Essential (primary) hypertension: Secondary | ICD-10-CM

## 2017-07-31 DIAGNOSIS — M545 Low back pain, unspecified: Secondary | ICD-10-CM

## 2017-07-31 DIAGNOSIS — R195 Other fecal abnormalities: Secondary | ICD-10-CM | POA: Diagnosis not present

## 2017-07-31 DIAGNOSIS — M25552 Pain in left hip: Secondary | ICD-10-CM

## 2017-07-31 DIAGNOSIS — M25551 Pain in right hip: Secondary | ICD-10-CM

## 2017-07-31 MED ORDER — OLMESARTAN MEDOXOMIL-HCTZ 40-12.5 MG PO TABS: 1.0000 | ORAL_TABLET | Freq: Every day | ORAL | 1 refills | Status: DC

## 2017-07-31 MED ORDER — MELOXICAM 15 MG PO TABS: 15.0000 mg | ORAL_TABLET | Freq: Every day | ORAL | 1 refills | Status: DC

## 2017-07-31 NOTE — Progress Notes (Signed)
Pre visit review using our clinic review tool, if applicable. No additional management support is needed unless otherwise documented below in the visit note. 

## 2017-07-31 NOTE — Patient Instructions (Signed)
Follow up as scheduled or as needed We'll call you with your GI appt, Ortho appt, and physical therapy Start the Meloxicam once daily- take w/ food.  Avoid any additional NSAIDs (ibuprofen, motrin, etc) STOP the Valsartan START the Olmesartan HCTZ combo Call with any questions or concerns Hang in there!!!  We'll get this better!

## 2017-07-31 NOTE — Progress Notes (Signed)
   Subjective:    Patient ID: Diana Vincent, female    DOB: 05/29/1956, 61 y.o.   MRN: [id number]  HPI Hip/back pain- bilateral hip pain and thoracic back pain.  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  Since late spring, pain is waking pt from sleep.  Taking ibuprofen before bed but when meds wear off, pt is uncomfortable.  Pt is leaning forward when walking.  Pt had injxns in hips previously ([facility])  Colon cancer screening- pt's cologuard came back +.  Needs referral to GI.  HTN- pt needs new medication due to valsartan recall.   Review of Systems For ROS see HPI     Objective:   Physical Exam  Constitutional: She is oriented to person, place, and time. She appears well-developed and well-nourished. No distress.  obese  Musculoskeletal: She exhibits tenderness (TTP over lumbar spine). She exhibits no edema or deformity.  Pain w/ back extension, improves w/ forward flexion  Neurological: She is alert and oriented to person, place, and time. She has normal reflexes. No cranial nerve deficit. Coordination normal.  Skin: Skin is warm and dry.  Psychiatric: She has a normal mood and affect. Her behavior is normal. Thought content normal.  Vitals reviewed.         Assessment & Plan:  Lumbar back pain- new.  Pt's pain worse w/ extension and improved w/ forward flexion are concerning for spinal stenosis.  Refer to Ortho for complete evaluation/treatment.  Start daily NSAID.  Refer to PT.  Will follow.  Bilateral hip pain- recurrent problem for pt.  She has required injxns previously.  Refer back to ortho  + cologuard- discussed results w/ pt and she is ok w/ referral to GI.

## 2017-07-31 NOTE — Assessment & Plan Note (Signed)
Chronic problem.  Well controlled but needs to switch medication due to Valsartan recall.  Will start Olmesartan HCTZ and monitor for improvement

## 2017-09-21 ENCOUNTER — Other Ambulatory Visit: Payer: Self-pay | Admitting: Family Medicine

## 2017-09-24 ENCOUNTER — Ambulatory Visit (AMBULATORY_SURGERY_CENTER): Payer: Self-pay | Admitting: *Deleted

## 2017-09-24 VITALS — Ht 65.0 in | Wt 225.4 lb

## 2017-09-24 DIAGNOSIS — R195 Other fecal abnormalities: Secondary | ICD-10-CM

## 2017-09-24 DIAGNOSIS — Z860100 Personal history of colon polyps, unspecified: Secondary | ICD-10-CM

## 2017-09-24 MED ORDER — NA SULFATE-K SULFATE-MG SULF 17.5-3.13-1.6 GM/177ML PO SOLN: ORAL | 0 refills | Status: DC

## 2017-09-24 NOTE — Progress Notes (Signed)
No allergies to eggs or soy. No problems with anesthesia.  Pt given Emmi instructions for colonoscopy  No oxygen use  No diet drug use  

## 2017-09-25 ENCOUNTER — Encounter: Payer: Self-pay | Admitting: Internal Medicine

## 2017-10-08 ENCOUNTER — Encounter: Payer: Self-pay | Admitting: Internal Medicine

## 2017-10-08 ENCOUNTER — Ambulatory Visit (AMBULATORY_SURGERY_CENTER): Payer: BC Managed Care – PPO | Admitting: Internal Medicine

## 2017-10-08 VITALS — BP 137/79 | HR 77 | Temp 98.4°F | Resp 15 | Ht 65.0 in | Wt 225.0 lb

## 2017-10-08 DIAGNOSIS — D124 Benign neoplasm of descending colon: Secondary | ICD-10-CM | POA: Diagnosis not present

## 2017-10-08 DIAGNOSIS — D122 Benign neoplasm of ascending colon: Secondary | ICD-10-CM

## 2017-10-08 DIAGNOSIS — R195 Other fecal abnormalities: Secondary | ICD-10-CM

## 2017-10-08 DIAGNOSIS — K635 Polyp of colon: Secondary | ICD-10-CM

## 2017-10-08 DIAGNOSIS — D127 Benign neoplasm of rectosigmoid junction: Secondary | ICD-10-CM

## 2017-10-08 DIAGNOSIS — D125 Benign neoplasm of sigmoid colon: Secondary | ICD-10-CM | POA: Diagnosis not present

## 2017-10-08 DIAGNOSIS — D123 Benign neoplasm of transverse colon: Secondary | ICD-10-CM | POA: Diagnosis not present

## 2017-10-08 DIAGNOSIS — D120 Benign neoplasm of cecum: Secondary | ICD-10-CM | POA: Diagnosis not present

## 2017-10-08 MED ORDER — SODIUM CHLORIDE 0.9 % IV SOLN: 500.0000 mL | INTRAVENOUS | Status: DC

## 2017-10-08 NOTE — Progress Notes (Signed)
To PACU, VSS. Report to [email or web address] 

## 2017-10-08 NOTE — Progress Notes (Signed)
Patient states pain of #4(joint pain). Denies nausea at this time. Ready for discharge.

## 2017-10-08 NOTE — Progress Notes (Signed)
Patient states nausea yet refuses medication.  Leaving IV in a little longer, and keeping her a little longer.  States prep too bad to come back too soon.  Dr stated that colon cancer would be worse.  Patient denies pain yet states uncomfortable.   Now states abd pain #7.  Patient sitting up. Does not appear to be in pain.

## 2017-10-08 NOTE — Op Note (Signed)
[name] Patient Name: Diana Vincent Procedure Date: 10/08/2017 9:03 AM MRN: [id number] Endoscopist: Jerene Bears , MD Age: 61 Referring MD:  Date of Birth: August 04, 1956 Gender: Female Account #: [id number] Procedure:                Colonoscopy Indications:              Personal history of colonic polyp in 2011 (tubular                            adenoma), Positive Cologuard test Medicines:                Monitored Anesthesia Care Procedure:                Pre-Anesthesia Assessment:                           - Prior to the procedure, a History and Physical                            was performed, and patient medications and                            allergies were reviewed. The patient's tolerance of                            previous anesthesia was also reviewed. The risks                            and benefits of the procedure and the sedation                            options and risks were discussed with the patient.                            All questions were answered, and informed consent                            was obtained. Prior Anticoagulants: The patient has                            taken no previous anticoagulant or antiplatelet                            agents. ASA Grade Assessment: II - A patient with                            mild systemic disease. After reviewing the risks                            and benefits, the patient was deemed in                            satisfactory condition to undergo the procedure.  After obtaining informed consent, the colonoscope                            was passed under direct vision. Throughout the                            procedure, the patient's blood pressure, pulse, and                            oxygen saturations were monitored continuously. The                            Colonoscope was introduced through the anus and                            advanced to the the cecum,  identified by                            appendiceal orifice and ileocecal valve. The                            colonoscopy was performed without difficulty. The                            patient tolerated the procedure well. The quality                            of the bowel preparation was good. The ileocecal                            valve, appendiceal orifice, and rectum were                            photographed. Scope In: 9:06:06 AM Scope Out: 9:45:08 AM Scope Withdrawal Time: 0 hours 35 minutes 1 second  Total Procedure Duration: 0 hours 39 minutes 2 seconds  Findings:                 The digital rectal exam was normal.                           A 3 mm polyp was found in the cecum. The polyp was                            sessile. The polyp was removed with a cold snare.                            Resection and retrieval were complete.                           A 5 mm polyp was found in the ascending colon. The                            polyp was sessile. The polyp was removed  with a                            cold snare. Resection and retrieval were complete.                           A 10 mm polyp was found in the ascending colon. The                            polyp was sessile. The polyp was removed with a                            cold snare. Resection and retrieval were complete.                           Two sessile polyps were found in the transverse                            colon. The polyps were 4 to 6 mm in size. These                            polyps were removed with a cold snare. Resection                            and retrieval were complete.                           A 3 mm polyp was found in the descending colon. The                            polyp was sessile. The polyp was removed with a                            cold snare. Resection and retrieval were complete.                           A 6 mm polyp was found in the sigmoid colon. The                             polyp was sessile. The polyp was removed with a                            cold snare. Resection and retrieval were complete.                           A 30 mm polyp was found in the recto-sigmoid colon.                            The polyp was sessile. The polyp was removed with a                            hot snare. Resection and  retrieval were complete.                           Multiple small and large-mouthed diverticula were                            found in the sigmoid colon and descending colon.                           The retroflexed view of the distal rectum and anal                            verge was normal and showed no anal or rectal                            abnormalities. Complications:            No immediate complications. Estimated Blood Loss:     Estimated blood loss was minimal. Impression:               - One 3 mm polyp in the cecum, removed with a cold                            snare. Resected and retrieved.                           - One 5 mm polyp in the ascending colon, removed                            with a cold snare. Resected and retrieved.                           - One 10 mm polyp in the ascending colon, removed                            with a cold snare. Resected and retrieved.                           - Two 4 to 6 mm polyps in the transverse colon,                            removed with a cold snare. Resected and retrieved.                           - One 3 mm polyp in the descending colon, removed                            with a cold snare. Resected and retrieved.                           - One 6 mm polyp in the sigmoid colon, removed with  a cold snare. Resected and retrieved.                           - One 30 mm polyp at the recto-sigmoid colon,                            removed with a hot snare. Resected and retrieved.                           - Moderate diverticulosis in the sigmoid colon  and                            in the descending colon.                           - The distal rectum and anal verge are normal on                            retroflexion view. Recommendation:           - Patient has a contact number available for                            emergencies. The signs and symptoms of potential                            delayed complications were discussed with the                            patient. Return to normal activities tomorrow.                            Written discharge instructions were provided to the                            patient.                           - Resume previous diet.                           - Continue present medications.                           - Await pathology results.                           - Repeat colonoscopy is recommended for                            surveillance. The colonoscopy date will be                            determined after pathology results from today's  exam become available for review.                           - No ibuprofen, naproxen, or other non-steroidal                            anti-inflammatory drugs for 3 weeks after polyp                            removal. Jerene Bears, MD 10/08/2017 9:52:31 AM This report has been signed electronically.

## 2017-10-08 NOTE — Progress Notes (Signed)
Pt. Reports no change in her medical or surgical history since her pre-visit [id number].

## 2017-10-08 NOTE — Patient Instructions (Addendum)
YOU HAD AN ENDOSCOPIC PROCEDURE TODAY AT [facility]:   Refer to the procedure report that was given to you for any specific questions about what was found during the examination.  If the procedure report does not answer your questions, please call your gastroenterologist to clarify.  If you requested that your care partner not be given the details of your procedure findings, then the procedure report has been included in a sealed envelope for you to review at your convenience later.  YOU SHOULD EXPECT: Some feelings of bloating in the abdomen. Passage of more gas than usual.  Walking can help get rid of the air that was put into your GI tract during the procedure and reduce the bloating. If you had a lower endoscopy (such as a colonoscopy or flexible sigmoidoscopy) you may notice spotting of blood in your stool or on the toilet paper. If you underwent a bowel prep for your procedure, you may not have a normal bowel movement for a few days.  Please Note:  You might notice some irritation and congestion in your nose or some drainage.  This is from the oxygen used during your procedure.  There is no need for concern and it should clear up in a day or so.  SYMPTOMS TO REPORT IMMEDIATELY:   Following lower endoscopy (colonoscopy or flexible sigmoidoscopy):  Excessive amounts of blood in the stool  Significant tenderness or worsening of abdominal pains  Swelling of the abdomen that is new, acute  Fever of 100F or higher   Fever of 100F or higher  Black, tarry-looking stools  For urgent or emergent issues, a gastroenterologist can be reached at any hour by calling [phone].   DIET:  We do recommend a small meal at first, but then you may proceed to your regular diet.  Drink plenty of fluids but you should avoid alcoholic beverages for 24 hours.  ACTIVITY:  You should plan to take it easy for the rest of today and you should NOT DRIVE or use heavy machinery until tomorrow  (because of the sedation medicines used during the test).    FOLLOW UP: Our staff will call the number listed on your records the next business day following your procedure to check on you and address any questions or concerns that you may have regarding the information given to you following your procedure. If we do not reach you, we will leave a message.  However, if you are feeling well and you are not experiencing any problems, there is no need to return our call.  We will assume that you have returned to your regular daily activities without incident.  If any biopsies were taken you will be contacted by phone or by letter within the next 1-3 weeks.  Please call us at [phone] if you have not heard about the biopsies in 3 weeks.    SIGNATURES/CONFIDENTIALITY: You and/or your care partner have signed paperwork which will be entered into your electronic medical record.  These signatures attest to the fact that that the information above on your After Visit Summary has been reviewed and is understood.  Full responsibility of the confidentiality of this discharge information lies with you and/or your care-partner.  No NSAIDS ie: ibuprofen, aleve, aspirin for three weeks per Dr. Hilarie Fredrickson.  Read all of the handouts given to you by your recovery room nurse.  Try to use miralax to relieve your constipation.  You must use it every day for awhile in  order for it to work.

## 2017-10-08 NOTE — Progress Notes (Signed)
Called to room to assist during endoscopic procedure.  Patient ID and intended procedure confirmed with present staff. Received instructions for my participation in the procedure from the performing physician.  

## 2017-10-09 ENCOUNTER — Telehealth: Payer: Self-pay

## 2017-10-09 ENCOUNTER — Other Ambulatory Visit (INDEPENDENT_AMBULATORY_CARE_PROVIDER_SITE_OTHER): Payer: BC Managed Care – PPO

## 2017-10-09 ENCOUNTER — Other Ambulatory Visit: Payer: Self-pay

## 2017-10-09 DIAGNOSIS — R195 Other fecal abnormalities: Secondary | ICD-10-CM

## 2017-10-09 LAB — CBC WITH DIFFERENTIAL/PLATELET
BASOS PCT: 0.2 % (ref 0.0–3.0)
Basophils Absolute: 0 10*3/uL (ref 0.0–0.1)
EOS ABS: 0.1 10*3/uL (ref 0.0–0.7)
Eosinophils Relative: 1.1 % (ref 0.0–5.0)
HCT: 46.3 % — ABNORMAL HIGH (ref 36.0–46.0)
Hemoglobin: 15.7 g/dL — ABNORMAL HIGH (ref 12.0–15.0)
Lymphocytes Relative: 13.4 % (ref 12.0–46.0)
Lymphs Abs: 1.3 10*3/uL (ref 0.7–4.0)
MCHC: 33.9 g/dL (ref 30.0–36.0)
MCV: 94.6 fl (ref 78.0–100.0)
MONO ABS: 0.8 10*3/uL (ref 0.1–1.0)
Monocytes Relative: 7.8 % (ref 3.0–12.0)
NEUTROS ABS: 7.6 10*3/uL (ref 1.4–7.7)
Neutrophils Relative %: 77.5 % — ABNORMAL HIGH (ref 43.0–77.0)
PLATELETS: 339 10*3/uL (ref 150.0–400.0)
RBC: 4.89 Mil/uL (ref 3.87–5.11)
RDW: 13 % (ref 11.5–15.5)
WBC: 9.8 10*3/uL (ref 4.0–10.5)

## 2017-10-09 NOTE — Telephone Encounter (Signed)
  Follow up Call-  Call Diana Vincent number 10/08/2017  Post procedure Call Diana Vincent phone  # [phone]  Permission to leave phone message Yes  Some recent data might be hidden     Patient questions:  Do you have a fever, pain , or abdominal swelling? No. Pain Score  0 *  Have you tolerated food without any problems? Yes.    Have you been able to return to your normal activities? Yes.    Do you have any questions about your discharge instructions: Diet   No. Medications  No. Follow up visit  No.  Do you have questions or concerns about your Care? Yes.    Actions: * If pain score is 4 or above: Physician/ provider Notified : Diana Jarred, MD.   Patient reports when getting out of the shower this morning at 6:42am she had a moderate amount of blood running down her leg. Patient states it took two bath towels to clean up the blood. Patient denies having a fever at this time. Patient states she has mild cramping, but not uncomfortable enough to take any medication. Patient reports she felt fine yesterday, and had no bleeding. Patient states she has not had any more bleeding since this morning. I informed her that I will discuss this with Dr Hilarie Fredrickson this morning when he arrives, and will call her Diana Vincent. She verbalized understanding.

## 2017-10-09 NOTE — Telephone Encounter (Signed)
Follow up phone call after speaking with Dr. Hilarie Fredrickson, MD ordered patient to come to office for CBC lab draw. MD instructs patient to report any bleeding to office due to moderate sized polyp removed on 10/08/17. These instructions were given to patient over the phone, patient verbalizes understanding. Patient denies having anymore bleeding, abdominal pain, and fever at this time.

## 2017-10-09 NOTE — Telephone Encounter (Signed)
  Follow up Call-  Call Elliotte Marsalis number 10/08/2017  Post procedure Call Kaity Pitstick phone  # [phone]  Permission to leave phone message Yes  Some recent data might be hidden     Patient questions:  Do you have a fever, pain , or abdominal swelling? No. Pain Score  0 *  Have you tolerated food without any problems? Yes.    Have you been able to return to your normal activities? Yes.    Do you have any questions about your discharge instructions: Diet   No. Medications  No. Follow up visit  No.  Do you have questions or concerns about your Care? No.  Actions: * If pain score is 4 or above: No action needed, pain <4.  Called patient to follow up from phone call this morning. Pt denies any bleeding since early this morning. Patient also denies fever and cramping. Patient has blood work done that came Jireh Elmore normal. Instructed patient to call office if she has anymore bleeding, abdominal pain or fever. Pt verbalized understanding.

## 2017-10-15 ENCOUNTER — Encounter: Payer: Self-pay | Admitting: Internal Medicine

## 2017-10-23 ENCOUNTER — Other Ambulatory Visit: Payer: Self-pay | Admitting: Family Medicine

## 2017-10-23 LAB — HM MAMMOGRAPHY: HM Mammogram: NORMAL (ref 0–4)

## 2017-10-27 ENCOUNTER — Encounter: Payer: Self-pay | Admitting: General Practice

## 2017-12-15 ENCOUNTER — Other Ambulatory Visit: Payer: Self-pay | Admitting: Family Medicine

## 2018-01-15 ENCOUNTER — Telehealth: Payer: Self-pay | Admitting: Family Medicine

## 2018-01-15 MED ORDER — MIRABEGRON ER 25 MG PO TB24: ORAL_TABLET | ORAL | 0 refills | Status: DC

## 2018-01-15 NOTE — Telephone Encounter (Signed)
Copied from [facility] [id number]. Topic: Quick Communication - Rx Refill/Question >> Jan 15, 2018  1:59 PM Ether Griffins B wrote: Medication: myrbetriq   Has the patient contacted their pharmacy? Yes.     (Agent: If no, request that the patient contact the pharmacy for the refill.)   Preferred Pharmacy (with phone number or street name): [id number] - [place], [address] AT [address]   Agent: Please be advised that RX refills may take up to 3 business days. We ask that you follow-up with your pharmacy.

## 2018-01-15 NOTE — Telephone Encounter (Signed)
Myrbetriq refill request Last OV 12/18/16 with Dr. Birdie Riddle where urinary urgency was addressed. [id number], [place]

## 2018-01-15 NOTE — Addendum Note (Signed)
Addended by: Davis Gourd on: 01/15/2018 03:07 PM   Modules accepted: Orders

## 2018-01-29 ENCOUNTER — Encounter: Payer: Self-pay | Admitting: General Practice

## 2018-02-02 ENCOUNTER — Other Ambulatory Visit: Payer: Self-pay | Admitting: Family Medicine

## 2018-02-03 ENCOUNTER — Other Ambulatory Visit: Payer: Self-pay | Admitting: Family Medicine

## 2018-02-10 ENCOUNTER — Other Ambulatory Visit: Payer: Self-pay | Admitting: Family Medicine

## 2018-02-25 ENCOUNTER — Ambulatory Visit: Payer: BC Managed Care – PPO | Admitting: Family Medicine

## 2018-02-25 ENCOUNTER — Encounter: Payer: Self-pay | Admitting: Family Medicine

## 2018-02-25 ENCOUNTER — Other Ambulatory Visit: Payer: Self-pay

## 2018-02-25 VITALS — BP 121/82 | HR 80 | Temp 98.6°F | Resp 17 | Ht 65.0 in | Wt 219.1 lb

## 2018-02-25 DIAGNOSIS — Z01818 Encounter for other preprocedural examination: Secondary | ICD-10-CM | POA: Diagnosis not present

## 2018-02-25 LAB — CBC WITH DIFFERENTIAL/PLATELET
BASOS ABS: 0 10*3/uL (ref 0.0–0.1)
Basophils Relative: 0.3 % (ref 0.0–3.0)
EOS ABS: 0.1 10*3/uL (ref 0.0–0.7)
Eosinophils Relative: 1.2 % (ref 0.0–5.0)
HCT: 44 % (ref 36.0–46.0)
Hemoglobin: 15.1 g/dL — ABNORMAL HIGH (ref 12.0–15.0)
Lymphocytes Relative: 21.1 % (ref 12.0–46.0)
Lymphs Abs: 1.7 10*3/uL (ref 0.7–4.0)
MCHC: 34.5 g/dL (ref 30.0–36.0)
MCV: 93.9 fl (ref 78.0–100.0)
MONOS PCT: 9 % (ref 3.0–12.0)
Monocytes Absolute: 0.7 10*3/uL (ref 0.1–1.0)
NEUTROS ABS: 5.4 10*3/uL (ref 1.4–7.7)
Neutrophils Relative %: 68.4 % (ref 43.0–77.0)
PLATELETS: 357 10*3/uL (ref 150.0–400.0)
RBC: 4.68 Mil/uL (ref 3.87–5.11)
RDW: 13.4 % (ref 11.5–15.5)
WBC: 7.8 10*3/uL (ref 4.0–10.5)

## 2018-02-25 LAB — HEPATIC FUNCTION PANEL
ALK PHOS: 41 U/L (ref 39–117)
ALT: 18 U/L (ref 0–35)
AST: 14 U/L (ref 0–37)
Albumin: 4.3 g/dL (ref 3.5–5.2)
BILIRUBIN DIRECT: 0.1 mg/dL (ref 0.0–0.3)
TOTAL PROTEIN: 6.6 g/dL (ref 6.0–8.3)
Total Bilirubin: 0.5 mg/dL (ref 0.2–1.2)

## 2018-02-25 LAB — BASIC METABOLIC PANEL
BUN: 14 mg/dL (ref 6–23)
CHLORIDE: 101 meq/L (ref 96–112)
CO2: 31 meq/L (ref 19–32)
Calcium: 9.8 mg/dL (ref 8.4–10.5)
Creatinine, Ser: 0.76 mg/dL (ref 0.40–1.20)
GFR: 82.16 mL/min (ref >60.00)
Glucose, Bld: 93 mg/dL (ref 70–99)
POTASSIUM: 3.8 meq/L (ref 3.5–5.1)
SODIUM: 137 meq/L (ref 135–145)

## 2018-02-25 NOTE — Progress Notes (Signed)
   Subjective:    Patient ID: Diana Vincent, female    DOB: 1956/09/21, 62 y.o.   MRN: [id number]  HPI    Review of Systems     Objective:   Physical Exam        Assessment & Plan:    Subjective:    Diana Vincent is a 62 y.o. female who presents to the office today for a preoperative consultation at the request of surgeon Dr Wynelle Link who plans on performing a R hip replacement on March 26. This consultation is requested for the specific conditions prompting preoperative evaluation (i.e. because of potential affect on operative risk): HTN. Planned anesthesia: general. The patient has the following known anesthesia issues: no past difficulties. Patients bleeding risk: use of Ca-channel blockers (see med list). No hx of abnormal bleeding- plan is to stop Mobic 7 days prior.  Patient does not have objections to receiving blood products if needed.  The following portions of the patient's history were reviewed and updated as appropriate: allergies, current medications, past family history, past medical history, past social history, past surgical history and problem list.  Review of Systems A comprehensive review of systems was negative.    Objective:    BP 121/82   Pulse 80   Temp 98.6 F (37 C) (Oral)   Resp 17   Ht 5\' 5"  (1.651 m)   Wt 219 lb 2 oz (99.4 kg)   SpO2 97%   BMI 36.46 kg/m  General appearance: alert, cooperative, appears stated age and no distress Head: Normocephalic, without obvious abnormality, atraumatic Neck: no adenopathy, supple, symmetrical, trachea midline and thyroid not enlarged, symmetric, no tenderness/mass/nodules Lungs: clear to auscultation bilaterally Heart: regular rate and rhythm, S1, S2 normal, no murmur, click, rub or gallop Abdomen: soft, non-tender; bowel sounds normal; no masses,  no organomegaly Pulses: 2+ and symmetric Skin: Skin color, texture, turgor normal. No rashes or lesions Lymph nodes: Cervical, supraclavicular, and axillary nodes  normal. Neurologic: Grossly normal  Predictors of intubation difficulty:  Morbid obesity? no  Anatomically abnormal facies? no  Prominent incisors? no  Receding mandible? no  Short, thick neck? no  Neck range of motion: normal  Dentition: No chipped, loose, or missing teeth.  Cardiographics ECG: normal sinus rhythm, no blocks or conduction defects, no ischemic changes Echocardiogram: not done  Imaging Chest x-ray: NA   Lab Review  Pending results     Assessment:      62 y.o. female with planned surgery as above.   Known risk factors for perioperative complications:    Difficulty with intubation is not anticipated.  Cardiac Risk Estimation: Low  Current medications which may produce withdrawal symptoms if withheld perioperatively: None     Plan:    1. Preoperative workup as follows ECG, hemoglobin, hematocrit, electrolytes, creatinine, glucose, liver function studies. 2. Change in medication regimen before surgery: discontinue NSAIDs (Mobic) 14 days before surgery. 3. Prophylaxis for cardiac events with perioperative beta-blockers: should be considered, specific regimen per anesthesia. 4. Invasive hemodynamic monitoring perioperatively: at the discretion of anesthesiologist. 5. Deep vein thrombosis prophylaxis postoperatively:regimen to be chosen by surgical team. 6. Surveillance for postoperative MI with ECG immediately postoperatively and on postoperative days 1 and 2 AND troponin levels 24 hours postoperatively and on day 4 or hospital discharge (whichever comes first): at the discretion of anesthesiologist. 7. Other measures: None

## 2018-02-25 NOTE — Patient Instructions (Signed)
Schedule your complete physical in 6 months (if not already scheduled) We'll notify you of your lab results and make any changes if needed STOP your anti-inflammatories (Mobic) as of tomorrow Call with any questions or concerns GOOD LUCK WITH SURGERY!!!

## 2018-02-26 ENCOUNTER — Encounter: Payer: Self-pay | Admitting: General Practice

## 2018-04-19 ENCOUNTER — Other Ambulatory Visit: Payer: Self-pay | Admitting: Family Medicine

## 2018-05-04 ENCOUNTER — Other Ambulatory Visit: Payer: Self-pay | Admitting: Family Medicine

## 2018-05-16 ENCOUNTER — Other Ambulatory Visit: Payer: Self-pay | Admitting: Family Medicine

## 2018-07-10 ENCOUNTER — Other Ambulatory Visit: Payer: Self-pay | Admitting: Family Medicine

## 2018-08-02 ENCOUNTER — Other Ambulatory Visit: Payer: Self-pay | Admitting: Family Medicine

## 2018-09-06 ENCOUNTER — Other Ambulatory Visit: Payer: Self-pay | Admitting: Family Medicine

## 2018-10-08 ENCOUNTER — Encounter: Payer: Self-pay | Admitting: Family Medicine

## 2018-10-08 ENCOUNTER — Ambulatory Visit: Payer: BC Managed Care – PPO | Admitting: Family Medicine

## 2018-10-08 ENCOUNTER — Other Ambulatory Visit: Payer: Self-pay

## 2018-10-08 VITALS — BP 118/80 | HR 78 | Temp 98.1°F | Resp 18 | Ht 65.0 in | Wt 215.2 lb

## 2018-10-08 DIAGNOSIS — Z0184 Encounter for antibody response examination: Secondary | ICD-10-CM

## 2018-10-08 DIAGNOSIS — Z23 Encounter for immunization: Secondary | ICD-10-CM

## 2018-10-08 DIAGNOSIS — E669 Obesity, unspecified: Secondary | ICD-10-CM | POA: Diagnosis not present

## 2018-10-08 DIAGNOSIS — I10 Essential (primary) hypertension: Secondary | ICD-10-CM | POA: Diagnosis not present

## 2018-10-08 DIAGNOSIS — M62838 Other muscle spasm: Secondary | ICD-10-CM | POA: Diagnosis not present

## 2018-10-08 MED ORDER — CYCLOBENZAPRINE HCL 10 MG PO TABS: 10.0000 mg | ORAL_TABLET | Freq: Three times a day (TID) | ORAL | 0 refills | Status: DC | PRN

## 2018-10-08 NOTE — Patient Instructions (Signed)
Schedule your complete physical in 6 months We'll notify you of your lab results and make any changes if needed Continue to work on healthy diet and regular exercise- you're doing great! Use the Cyclobenzaprine as needed for trap spasm- may cause drowsiness HEAT! Call with any questions or concerns Happy Fall!!!

## 2018-10-08 NOTE — Assessment & Plan Note (Signed)
Pt is down 4-5 lbs since last visit.  She has resumed walking since her knee replacement.  Applauded her efforts.  Will continue to follow.

## 2018-10-08 NOTE — Assessment & Plan Note (Signed)
Chronic problem.  Doing well on current medication.  Check labs.  No anticipated med changes.  Will follow.

## 2018-10-08 NOTE — Progress Notes (Signed)
   Subjective:    Patient ID: Diana Vincent, female    DOB: 1956/11/10, 62 y.o.   MRN: [id number]  HPI  HTN- chronic problem, on Amlodipine 5mg  daily and Olmesartan HCTZ 40/12.5mg  daily.  BP is well controlled today.  Denies CP, SOB, HAs, visual changes, edema.  R trap spasm- 'it's killing me', difficulty turning head to L.  Pain radiates up neck into base of head, out to shoulder.  No relief w/ ibuprofen.  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  Obesity- pt is down 5 lbs since last visit.  Pt has resumed walking after hip replacement in March.   Review of Systems For ROS see HPI     Objective:   Physical Exam  Constitutional: She is oriented to person, place, and time. She appears well-developed and well-nourished. No distress.  obese  HENT:  Head: Normocephalic and atraumatic.  Eyes: Pupils are equal, round, and reactive to light. Conjunctivae and EOM are normal.  Neck: Neck supple. No thyromegaly present.  TTP over R trap w/ limited rotation  Cardiovascular: Normal rate, regular rhythm, normal heart sounds and intact distal pulses.  No murmur heard. Pulmonary/Chest: Effort normal and breath sounds normal. No respiratory distress.  Abdominal: Soft. She exhibits no distension. There is no tenderness.  Musculoskeletal: She exhibits no edema.  Lymphadenopathy:    She has no cervical adenopathy.  Neurological: She is alert and oriented to person, place, and time.  Skin: Skin is warm and dry.  Psychiatric: She has a normal mood and affect. Her behavior is normal.  Vitals reviewed.         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  Start Flexeril prn.  Heat.  NSAIDs prn.  Reviewed supportive care and red flags that should prompt return.  Pt expressed understanding and is in agreement w/ plan.

## 2018-10-09 ENCOUNTER — Other Ambulatory Visit: Payer: Self-pay | Admitting: Family Medicine

## 2018-10-11 ENCOUNTER — Other Ambulatory Visit: Payer: Self-pay | Admitting: General Practice

## 2018-10-11 ENCOUNTER — Encounter: Payer: Self-pay | Admitting: General Practice

## 2018-10-11 LAB — BASIC METABOLIC PANEL
BUN: 14 mg/dL (ref 7–25)
CHLORIDE: 100 mmol/L (ref 98–110)
CO2: 29 mmol/L (ref 20–32)
CREATININE: 0.99 mg/dL (ref 0.50–0.99)
Calcium: 9.8 mg/dL (ref 8.6–10.4)
Glucose, Bld: 89 mg/dL (ref 65–99)
POTASSIUM: 4.3 mmol/L (ref 3.5–5.3)
Sodium: 138 mmol/L (ref 135–146)

## 2018-10-11 LAB — CBC WITH DIFFERENTIAL/PLATELET
BASOS ABS: 27 {cells}/uL (ref 0–200)
Basophils Relative: 0.3 %
EOS ABS: 155 {cells}/uL (ref 15–500)
EOS PCT: 1.7 %
HCT: 45.8 % — ABNORMAL HIGH (ref 35.0–45.0)
Hemoglobin: 16.1 g/dL — ABNORMAL HIGH (ref 11.7–15.5)
Lymphs Abs: 1884 cells/uL (ref 850–3900)
MCH: 31.8 pg (ref 27.0–33.0)
MCHC: 35.2 g/dL (ref 32.0–36.0)
MCV: 90.3 fL (ref 80.0–100.0)
MPV: 10.7 fL (ref 7.5–12.5)
Monocytes Relative: 9.6 %
NEUTROS PCT: 67.7 %
Neutro Abs: 6161 cells/uL (ref 1500–7800)
PLATELETS: 404 10*3/uL — AB (ref 140–400)
RBC: 5.07 10*6/uL (ref 3.80–5.10)
RDW: 12.8 % (ref 11.0–15.0)
TOTAL LYMPHOCYTE: 20.7 %
WBC mixed population: 874 cells/uL (ref 200–950)
WBC: 9.1 10*3/uL (ref 3.8–10.8)

## 2018-10-11 LAB — MEASLES/MUMPS/RUBELLA IMMUNITY
Mumps IgG: >300 AU/mL
Rubella: 17 index
Rubeola IgG: >300 AU/mL

## 2018-10-11 MED ORDER — MIRABEGRON ER 25 MG PO TB24: ORAL_TABLET | ORAL | 0 refills | Status: DC

## 2018-12-01 LAB — HM MAMMOGRAPHY: HM Mammogram: NORMAL (ref 0–4)

## 2018-12-01 LAB — HM PAP SMEAR

## 2018-12-07 LAB — HM MAMMOGRAPHY
HPV 16/18/45 genotyping: POSITIVE
HPV, high-risk: POSITIVE

## 2018-12-07 LAB — HM PAP SMEAR

## 2018-12-14 ENCOUNTER — Encounter: Payer: Self-pay | Admitting: General Practice

## 2019-01-05 ENCOUNTER — Other Ambulatory Visit: Payer: Self-pay | Admitting: Family Medicine

## 2019-01-28 ENCOUNTER — Other Ambulatory Visit: Payer: Self-pay | Admitting: Family Medicine

## 2019-03-30 ENCOUNTER — Other Ambulatory Visit: Payer: Self-pay | Admitting: Family Medicine

## 2019-04-05 ENCOUNTER — Encounter: Payer: Self-pay | Admitting: Family Medicine

## 2019-04-25 ENCOUNTER — Other Ambulatory Visit: Payer: Self-pay | Admitting: Family Medicine

## 2019-06-07 ENCOUNTER — Ambulatory Visit: Payer: BC Managed Care – PPO | Admitting: Family Medicine

## 2019-06-07 ENCOUNTER — Other Ambulatory Visit: Payer: Self-pay

## 2019-06-07 ENCOUNTER — Encounter: Payer: Self-pay | Admitting: Family Medicine

## 2019-06-07 VITALS — BP 120/80 | HR 81 | Temp 97.9°F | Resp 17 | Ht 65.0 in | Wt 215.2 lb

## 2019-06-07 DIAGNOSIS — R5383 Other fatigue: Secondary | ICD-10-CM

## 2019-06-07 DIAGNOSIS — R82998 Other abnormal findings in urine: Secondary | ICD-10-CM

## 2019-06-07 DIAGNOSIS — M5412 Radiculopathy, cervical region: Secondary | ICD-10-CM | POA: Diagnosis not present

## 2019-06-07 DIAGNOSIS — R350 Frequency of micturition: Secondary | ICD-10-CM | POA: Diagnosis not present

## 2019-06-07 LAB — POCT URINALYSIS DIPSTICK
Bilirubin, UA: NEGATIVE
Blood, UA: NEGATIVE
Glucose, UA: NEGATIVE
Nitrite, UA: NEGATIVE
Protein, UA: NEGATIVE
Spec Grav, UA: <=1.005 — AB (ref 1.010–1.025)
Urobilinogen, UA: 0.2 E.U./dL
pH, UA: 6.5 (ref 5.0–8.0)

## 2019-06-07 LAB — CBC WITH DIFFERENTIAL/PLATELET
Basophils Absolute: 0 10*3/uL (ref 0.0–0.1)
Basophils Relative: 0.4 % (ref 0.0–3.0)
Eosinophils Absolute: 0.1 10*3/uL (ref 0.0–0.7)
Eosinophils Relative: 1.5 % (ref 0.0–5.0)
HCT: 46.1 % — ABNORMAL HIGH (ref 36.0–46.0)
Hemoglobin: 15.8 g/dL — ABNORMAL HIGH (ref 12.0–15.0)
Lymphocytes Relative: 22.6 % (ref 12.0–46.0)
Lymphs Abs: 1.7 10*3/uL (ref 0.7–4.0)
MCHC: 34.2 g/dL (ref 30.0–36.0)
MCV: 95.1 fl (ref 78.0–100.0)
Monocytes Absolute: 0.6 10*3/uL (ref 0.1–1.0)
Monocytes Relative: 8.3 % (ref 3.0–12.0)
Neutro Abs: 5 10*3/uL (ref 1.4–7.7)
Neutrophils Relative %: 67.2 % (ref 43.0–77.0)
Platelets: 359 10*3/uL (ref 150.0–400.0)
RBC: 4.85 Mil/uL (ref 3.87–5.11)
RDW: 13.2 % (ref 11.5–15.5)
WBC: 7.5 10*3/uL (ref 4.0–10.5)

## 2019-06-07 LAB — HEPATIC FUNCTION PANEL
ALT: 18 U/L (ref 0–35)
AST: 14 U/L (ref 0–37)
Albumin: 4.2 g/dL (ref 3.5–5.2)
Alkaline Phosphatase: 44 U/L (ref 39–117)
Bilirubin, Direct: 0.1 mg/dL (ref 0.0–0.3)
Total Bilirubin: 0.5 mg/dL (ref 0.2–1.2)
Total Protein: 6.2 g/dL (ref 6.0–8.3)

## 2019-06-07 LAB — BASIC METABOLIC PANEL
BUN: 16 mg/dL (ref 6–23)
CO2: 27 mEq/L (ref 19–32)
Calcium: 9 mg/dL (ref 8.4–10.5)
Chloride: 100 mEq/L (ref 96–112)
Creatinine, Ser: 0.72 mg/dL (ref 0.40–1.20)
GFR: 81.93 mL/min (ref >60.00)
Glucose, Bld: 82 mg/dL (ref 70–99)
Potassium: 3.9 mEq/L (ref 3.5–5.1)
Sodium: 135 mEq/L (ref 135–145)

## 2019-06-07 LAB — B12 AND FOLATE PANEL
Folate: 15.2 ng/mL (ref >5.9)
Vitamin B-12: 181 pg/mL — ABNORMAL LOW (ref 211–911)

## 2019-06-07 LAB — VITAMIN D 25 HYDROXY (VIT D DEFICIENCY, FRACTURES): VITD: 34.61 ng/mL (ref 30.00–100.00)

## 2019-06-07 LAB — TSH: TSH: 2.09 u[IU]/mL (ref 0.35–4.50)

## 2019-06-07 MED ORDER — METHOCARBAMOL 500 MG PO TABS: 500.0000 mg | ORAL_TABLET | Freq: Three times a day (TID) | ORAL | 0 refills | Status: DC | PRN

## 2019-06-07 MED ORDER — MELOXICAM 15 MG PO TABS: ORAL_TABLET | ORAL | 0 refills | Status: DC

## 2019-06-07 NOTE — Patient Instructions (Addendum)
Schedule your complete physical in the next 2-3 months We'll notify you of your lab results and make any changes if needed Continue to work on healthy diet and regular exercise- you can do it!! Continue to drink plenty of fluids START the Meloxicam once daily- take w/ food USE the Methocarbamol as needed for muscle spasm We'll call you with your Ortho appt If you continue to feel down, let me know or call Pauline Good for medication adjustment Call with any questions or concerns Hang in there!!!

## 2019-06-07 NOTE — Progress Notes (Signed)
   Subjective:    Patient ID: Diana Vincent, female    DOB: 26-Mar-1956, 63 y.o.   MRN: [id number]  HPI Back/neck pain- R sided.  Went to chiropractor fall of 2019.  Xray showed 'C5 is messed up'.  Pt reports pain when sitting at computer, riding in a car.  Pain will radiate from neck, into R shoulder, and down into lower back.  Pain is waking her from sleep.  Shoulder does not hurt independently of neck.  Mild relief w/ ibuprofen.  Is out of Flexeril  Urinary frequency- pt is worried about prediabetes.  Weight is stable.  sxs started ~1 month ago.  Increased water intake.  Yesterday noticed some burning w/ urination.  Fatigue- 'I can sit down and fall asleep'.  Pt tries to walk 1.5 miles daily but recently she has had to decline due to fatigue.   Review of Systems For ROS see HPI     Objective:   Physical Exam Vitals signs reviewed.  Constitutional:      General: She is not in acute distress.    Appearance: She is well-developed.  HENT:     Head: Normocephalic and atraumatic.  Eyes:     Conjunctiva/sclera: Conjunctivae normal.     Pupils: Pupils are equal, round, and reactive to light.  Neck:     Musculoskeletal: Normal range of motion and neck supple. Muscular tenderness (TTP over R upper and lower trap) present.     Thyroid: No thyromegaly.  Cardiovascular:     Rate and Rhythm: Normal rate and regular rhythm.     Heart sounds: Normal heart sounds. No murmur.  Pulmonary:     Effort: Pulmonary effort is normal. No respiratory distress.     Breath sounds: Normal breath sounds.  Abdominal:     General: There is no distension.     Palpations: Abdomen is soft.     Tenderness: There is no abdominal tenderness.  Musculoskeletal:     Comments: Full ROM of R shoulder  Lymphadenopathy:     Cervical: No cervical adenopathy.  Skin:    General: Skin is warm and dry.  Neurological:     Mental Status: She is alert and oriented to person, place, and time.  Psychiatric:        Behavior:  Behavior normal.           Assessment & Plan:  Cervical radiculopathy- pt's current pain distribution is her R trap (upper and lower).  She reports previous xray showing degeneration.  Start daily Meloxicam, Methocarbamol as needed for spasm.  Refer to Ortho.  Pt expressed understanding and is in agreement w/ plan.   Urinary frequency- no evidence of DM.  Send for culture given presence of leuks.  Tx if needed.  Fatigue- suspect this is multifactorial and a large part of it is stress/depression.  Encouraged pt to reach out to Psych to discuss med adjustment.  Check labs to r/o metabolic cause.  Will continue to follow.

## 2019-06-08 ENCOUNTER — Other Ambulatory Visit: Payer: Self-pay | Admitting: Family Medicine

## 2019-06-08 DIAGNOSIS — E538 Deficiency of other specified B group vitamins: Secondary | ICD-10-CM

## 2019-06-08 LAB — URINE CULTURE
MICRO NUMBER:: 598607
SPECIMEN QUALITY:: ADEQUATE

## 2019-06-08 MED ORDER — CYANOCOBALAMIN 1000 MCG/ML IJ SOLN
1000.0000 ug | INTRAMUSCULAR | Status: DC
  Administered 2019-06-13 – 2019-07-04 (×3): 1000 ug via INTRAMUSCULAR

## 2019-06-08 MED ORDER — CYANOCOBALAMIN 1000 MCG/ML IJ SOLN
1000.0000 ug | INTRAMUSCULAR | Status: DC
  Administered 2019-07-11 – 2019-08-08 (×2): 1000 ug via INTRAMUSCULAR

## 2019-06-10 ENCOUNTER — Ambulatory Visit: Payer: BC Managed Care – PPO

## 2019-06-13 ENCOUNTER — Ambulatory Visit (INDEPENDENT_AMBULATORY_CARE_PROVIDER_SITE_OTHER): Payer: BC Managed Care – PPO

## 2019-06-13 ENCOUNTER — Other Ambulatory Visit: Payer: Self-pay

## 2019-06-13 DIAGNOSIS — E538 Deficiency of other specified B group vitamins: Secondary | ICD-10-CM

## 2019-06-13 NOTE — Addendum Note (Signed)
Addended by: Midge Minium on: 06/13/2019 10:30 AM   Modules accepted: Level of Service

## 2019-06-13 NOTE — Progress Notes (Addendum)
Administered first B12 injection IM left arm. Patient tolerated well. Will not be able to return next week for injection. Scheduled next second injection week of July 13th 2020.   Above order is mine.  Annye Asa, MD

## 2019-06-27 ENCOUNTER — Other Ambulatory Visit: Payer: Self-pay

## 2019-06-27 ENCOUNTER — Ambulatory Visit (INDEPENDENT_AMBULATORY_CARE_PROVIDER_SITE_OTHER): Payer: BC Managed Care – PPO

## 2019-06-27 DIAGNOSIS — E538 Deficiency of other specified B group vitamins: Secondary | ICD-10-CM

## 2019-06-27 NOTE — Addendum Note (Signed)
Addended by: Midge Minium on: 06/27/2019 09:47 AM   Modules accepted: Level of Service

## 2019-06-27 NOTE — Progress Notes (Addendum)
Diana Vincent 16 y.r old female present today for B12 injection per Annye Asa, MD. Administered CYANOCOBALAMIN 1,000 MCG IM right arm. Patient tolerated well. Patient stated "is there anything we can do to speed things along, I am falling asleep all through out the day and have no energy." Advised patient that I would send note to PCP.  The shots are the way to speed the process as opposed to taking oral medication.  Increase water intake, attempt to get regular activity as this will improve energy level (it's a pay it forward kind of thing).  Annye Asa, MD

## 2019-06-27 NOTE — Progress Notes (Signed)
Spoke with pt and advised of PCP recommendations. She stated an understanding.

## 2019-07-02 ENCOUNTER — Other Ambulatory Visit: Payer: Self-pay | Admitting: Family Medicine

## 2019-07-04 ENCOUNTER — Other Ambulatory Visit: Payer: Self-pay

## 2019-07-04 ENCOUNTER — Ambulatory Visit (INDEPENDENT_AMBULATORY_CARE_PROVIDER_SITE_OTHER): Payer: BC Managed Care – PPO

## 2019-07-04 DIAGNOSIS — E538 Deficiency of other specified B group vitamins: Secondary | ICD-10-CM

## 2019-07-04 MED ORDER — CYANOCOBALAMIN 1000 MCG/ML IJ SOLN: 1000.0000 ug | Freq: Once | INTRAMUSCULAR | Status: DC

## 2019-07-04 NOTE — Addendum Note (Signed)
Addended by: Midge Minium on: 07/04/2019 09:59 AM   Modules accepted: Level of Service

## 2019-07-04 NOTE — Progress Notes (Addendum)
Diana Vincent 63 y.o. female presents to office today for 3rd B12 injection per PCP, Annye Asa, MD. Administered CYANOCOBALAMIN 1,073mcg IM right arm. Patient tolerated well.  The above order is mine.  Annye Asa, MD

## 2019-07-11 ENCOUNTER — Ambulatory Visit (INDEPENDENT_AMBULATORY_CARE_PROVIDER_SITE_OTHER): Payer: BC Managed Care – PPO

## 2019-07-11 ENCOUNTER — Other Ambulatory Visit: Payer: Self-pay

## 2019-07-11 ENCOUNTER — Other Ambulatory Visit: Payer: Self-pay | Admitting: Family Medicine

## 2019-07-11 DIAGNOSIS — E538 Deficiency of other specified B group vitamins: Secondary | ICD-10-CM

## 2019-07-11 NOTE — Progress Notes (Addendum)
Diana Vincent 63 y.o. female presents to office today for 4th B12 injection per PCP, Annye Asa, MD. Administered CYANOCOBALAMIN 1,000 mcg IM left arm. Patient tolerated well.   The above order is mine.  Annye Asa, MD

## 2019-07-11 NOTE — Addendum Note (Signed)
Addended by: Midge Minium on: 07/11/2019 10:11 AM   Modules accepted: Level of Service

## 2019-07-28 ENCOUNTER — Other Ambulatory Visit: Payer: Self-pay | Admitting: Family Medicine

## 2019-08-08 ENCOUNTER — Other Ambulatory Visit: Payer: Self-pay

## 2019-08-08 ENCOUNTER — Ambulatory Visit (INDEPENDENT_AMBULATORY_CARE_PROVIDER_SITE_OTHER): Payer: BC Managed Care – PPO

## 2019-08-08 DIAGNOSIS — E538 Deficiency of other specified B group vitamins: Secondary | ICD-10-CM

## 2019-08-08 NOTE — Progress Notes (Addendum)
Diana Vincent 63 y.o. female presents to office for B12 injection nurse visit per PCP, Annye Asa, MD. Administered CYANOCOBALAMIN 1,000 mcg IM right arm. Patient tolerated well. Patient has CPE scheduled for next month.   Above order was mine.  Annye Asa, MD

## 2019-08-12 NOTE — Addendum Note (Signed)
Addended by: Midge Minium on: 08/12/2019 01:38 PM   Modules accepted: Level of Service

## 2019-09-05 ENCOUNTER — Encounter: Payer: Self-pay | Admitting: Family Medicine

## 2019-09-05 ENCOUNTER — Ambulatory Visit (INDEPENDENT_AMBULATORY_CARE_PROVIDER_SITE_OTHER): Payer: BC Managed Care – PPO | Admitting: Family Medicine

## 2019-09-05 ENCOUNTER — Other Ambulatory Visit: Payer: Self-pay

## 2019-09-05 VITALS — BP 124/84 | HR 78 | Temp 98.4°F | Resp 16 | Ht 65.0 in | Wt 210.0 lb

## 2019-09-05 DIAGNOSIS — Z23 Encounter for immunization: Secondary | ICD-10-CM

## 2019-09-05 DIAGNOSIS — E538 Deficiency of other specified B group vitamins: Secondary | ICD-10-CM | POA: Diagnosis not present

## 2019-09-05 DIAGNOSIS — I10 Essential (primary) hypertension: Secondary | ICD-10-CM

## 2019-09-05 DIAGNOSIS — Z0000 Encounter for general adult medical examination without abnormal findings: Secondary | ICD-10-CM

## 2019-09-05 NOTE — Assessment & Plan Note (Signed)
Pt has hx of this.  Check labs.  Replete prn. 

## 2019-09-05 NOTE — Assessment & Plan Note (Signed)
Pt's PE WNL w/ exception of obesity.  UTD on pap, mammo, colonoscopy, Tdap.  Flu given.  Check labs.  Anticipatory guidance provided.

## 2019-09-05 NOTE — Patient Instructions (Addendum)
Follow up in 6 months to recheck BP We'll notify you of your lab results and make any changes if needed Continue to work on healthy diet and regular exercise- you're down 5 lbs!  Doing great! Call with any questions or concerns Stay Safe! Hang in there!

## 2019-09-05 NOTE — Progress Notes (Signed)
   Subjective:    Patient ID: Diana Vincent, female    DOB: 19-Apr-1956, 63 y.o.   MRN: [id number]  HPI CPE- UTD on pap, mammo, colonoscopy, Tdap.  Due for flu.  Pt is down 5 lbs since last visit.   Review of Systems Patient reports no vision/ hearing changes, adenopathy,fever, persistant/recurrent hoarseness , swallowing issues, chest pain, palpitations, edema, persistant/recurrent cough, hemoptysis, dyspnea (rest/exertional/paroxysmal nocturnal), gastrointestinal bleeding (melena, rectal bleeding), abdominal pain, significant heartburn, bowel changes, GU symptoms (dysuria, hematuria, incontinence), Gyn symptoms (abnormal  bleeding, pain),  syncope, focal weakness, memory loss, numbness & tingling, skin/hair/nail changes, abnormal bruising or bleeding, anxiety, or depression.     Objective:   Physical Exam General Appearance:    Alert, cooperative, no distress, appears stated age, obese  Head:    Normocephalic, without obvious abnormality, atraumatic  Eyes:    PERRL, conjunctiva/corneas clear, EOM's intact, fundi    benign, both eyes  Ears:    Normal TM's and external ear canals, both ears  Nose:   Deferred due to COVID  Throat:   Neck:   Supple, symmetrical, trachea midline, no adenopathy;    Thyroid: no enlargement/tenderness/nodules  Back:     Symmetric, no curvature, ROM normal, no CVA tenderness  Lungs:     Clear to auscultation bilaterally, respirations unlabored  Chest Wall:    No tenderness or deformity   Heart:    Regular rate and rhythm, S1 and S2 normal, no murmur, rub   or gallop  Breast Exam:    Deferred to GYN  Abdomen:     Soft, non-tender, bowel sounds active all four quadrants,    no masses, no organomegaly  Genitalia:    Deferred to GYN  Rectal:    Extremities:   Extremities normal, atraumatic, no cyanosis or edema  Pulses:   2+ and symmetric all extremities  Skin:   Skin color, texture, turgor normal, no rashes or lesions  Lymph nodes:   Cervical, supraclavicular,  and axillary nodes normal  Neurologic:   CNII-XII intact, normal strength, sensation and reflexes    throughout          Assessment & Plan:

## 2019-09-05 NOTE — Assessment & Plan Note (Signed)
Chronic problem.  Adequate control.  Asymptomatic.  Check labs.  No anticipated med changes 

## 2019-09-06 LAB — CBC WITH DIFFERENTIAL/PLATELET
Basophils Absolute: 0.1 10*3/uL (ref 0.0–0.1)
Basophils Relative: 1.8 % (ref 0.0–3.0)
Eosinophils Absolute: 0.1 10*3/uL (ref 0.0–0.7)
Eosinophils Relative: 1.3 % (ref 0.0–5.0)
HCT: 45.2 % (ref 36.0–46.0)
Hemoglobin: 15.4 g/dL — ABNORMAL HIGH (ref 12.0–15.0)
Lymphocytes Relative: 22.2 % (ref 12.0–46.0)
Lymphs Abs: 1.7 10*3/uL (ref 0.7–4.0)
MCHC: 34 g/dL (ref 30.0–36.0)
MCV: 94.6 fl (ref 78.0–100.0)
Monocytes Absolute: 0.4 10*3/uL (ref 0.1–1.0)
Monocytes Relative: 5.2 % (ref 3.0–12.0)
Neutro Abs: 5.4 10*3/uL (ref 1.4–7.7)
Neutrophils Relative %: 69.5 % (ref 43.0–77.0)
Platelets: 372 10*3/uL (ref 150.0–400.0)
RBC: 4.78 Mil/uL (ref 3.87–5.11)
RDW: 13.7 % (ref 11.5–15.5)
WBC: 7.8 10*3/uL (ref 4.0–10.5)

## 2019-09-06 LAB — LIPID PANEL
Cholesterol: 104 mg/dL (ref 0–200)
HDL: 42.8 mg/dL (ref >39.00)
LDL Cholesterol: 42 mg/dL (ref 0–99)
NonHDL: 61.42
Total CHOL/HDL Ratio: 2
Triglycerides: 99 mg/dL (ref 0.0–149.0)
VLDL: 19.8 mg/dL (ref 0.0–40.0)

## 2019-09-06 LAB — VITAMIN B12: Vitamin B-12: >1500 pg/mL — ABNORMAL HIGH (ref 211–911)

## 2019-09-06 LAB — HEPATIC FUNCTION PANEL
ALT: 22 U/L (ref 0–35)
AST: 17 U/L (ref 0–37)
Albumin: 4.3 g/dL (ref 3.5–5.2)
Alkaline Phosphatase: 49 U/L (ref 39–117)
Bilirubin, Direct: 0.1 mg/dL (ref 0.0–0.3)
Total Bilirubin: 0.5 mg/dL (ref 0.2–1.2)
Total Protein: 6.7 g/dL (ref 6.0–8.3)

## 2019-09-06 LAB — BASIC METABOLIC PANEL
BUN: 10 mg/dL (ref 6–23)
CO2: 28 mEq/L (ref 19–32)
Calcium: 10 mg/dL (ref 8.4–10.5)
Chloride: 100 mEq/L (ref 96–112)
Creatinine, Ser: 0.69 mg/dL (ref 0.40–1.20)
GFR: 85.99 mL/min (ref >60.00)
Glucose, Bld: 82 mg/dL (ref 70–99)
Potassium: 3.8 mEq/L (ref 3.5–5.1)
Sodium: 137 mEq/L (ref 135–145)

## 2019-09-06 LAB — TSH: TSH: 1.63 u[IU]/mL (ref 0.35–4.50)

## 2019-09-07 ENCOUNTER — Encounter: Payer: Self-pay | Admitting: General Practice

## 2019-09-20 ENCOUNTER — Encounter: Payer: Self-pay | Admitting: Internal Medicine

## 2019-10-04 ENCOUNTER — Other Ambulatory Visit: Payer: Self-pay | Admitting: General Practice

## 2019-10-04 MED ORDER — MIRABEGRON ER 25 MG PO TB24: ORAL_TABLET | ORAL | 0 refills | Status: DC

## 2019-10-11 ENCOUNTER — Other Ambulatory Visit: Payer: Self-pay | Admitting: General Practice

## 2019-10-11 MED ORDER — AMLODIPINE BESYLATE 5 MG PO TABS: ORAL_TABLET | ORAL | 0 refills | Status: DC

## 2019-11-08 ENCOUNTER — Other Ambulatory Visit: Payer: Self-pay | Admitting: General Practice

## 2019-11-08 MED ORDER — OLMESARTAN MEDOXOMIL-HCTZ 40-12.5 MG PO TABS: 1.0000 | ORAL_TABLET | Freq: Every day | ORAL | 0 refills | Status: DC

## 2019-12-12 ENCOUNTER — Telehealth: Payer: Self-pay | Admitting: General Practice

## 2019-12-12 NOTE — Telephone Encounter (Signed)
Received a phone call from Dr. Radene Ou office advising that they would like pt to have a sleep study due to positive review of symptoms for Sleep Apnea. He is worried that increased hypoxia could be detrimental to patients macular health.   Ok to have pt schedule an appt to discuss if she is willing?

## 2019-12-12 NOTE — Telephone Encounter (Signed)
Noted. Called pt and LMOVM.

## 2019-12-12 NOTE — Telephone Encounter (Signed)
Pt returned call. Advised that she has been experiencing heavy snoring, waking up 3-4 times a night, she is constantly falling asleep during the day. Pt was scheduled for appt on Wednesday afternoon.

## 2019-12-12 NOTE — Telephone Encounter (Signed)
Since this is not something we have discussed at previous visits, pt will need to schedule an appt to review symptoms for referral.

## 2019-12-14 ENCOUNTER — Other Ambulatory Visit: Payer: Self-pay

## 2019-12-14 ENCOUNTER — Ambulatory Visit (INDEPENDENT_AMBULATORY_CARE_PROVIDER_SITE_OTHER): Payer: BC Managed Care – PPO | Admitting: Family Medicine

## 2019-12-14 ENCOUNTER — Encounter: Payer: Self-pay | Admitting: Family Medicine

## 2019-12-14 VITALS — BP 116/65 | HR 81 | Ht 64.0 in | Wt 205.0 lb

## 2019-12-14 DIAGNOSIS — G4733 Obstructive sleep apnea (adult) (pediatric): Secondary | ICD-10-CM | POA: Diagnosis not present

## 2019-12-14 DIAGNOSIS — E669 Obesity, unspecified: Secondary | ICD-10-CM | POA: Diagnosis not present

## 2019-12-14 DIAGNOSIS — F418 Other specified anxiety disorders: Secondary | ICD-10-CM

## 2019-12-14 NOTE — Progress Notes (Signed)
I have discussed the procedure for the virtual visit with the patient who has given consent to proceed with assessment and treatment.   [name], CMA     

## 2019-12-14 NOTE — Progress Notes (Signed)
Virtual Visit via Video   I connected with patient on 12/14/19 at  2:00 PM EST by a video enabled telemedicine application and verified that I am speaking with the correct person using two identifiers.  Location patient: Home Location provider: [facility], Office Persons participating in the virtual visit: Patient, Provider, [place] (Jess B)  I discussed the limitations of evaluation and management by telemedicine and the availability of in person appointments. The patient expressed understanding and agreed to proceed.  Interactive audio and video telecommunications were attempted between this provider and patient, however failed, due to patient having technical difficulties OR patient did not have access to video capability.  We continued and completed visit with audio only.   Subjective:   HPI:  Possible OSA- pt reports she is not sleeping well at night b/c of multiple awakenings.  Husband reports pt snores at night.  Waking feeling poorly rested.  Having excessive daytime sleepiness, will fall asleep reading the newspaper in the afternoon.  Retinal specialist is concerned about possible sleep apnea.  Pt would like to stop Ambien if possible.  Pt reports high levels of stress.  Anxiety/depression- pt would like to consolidate her providers and have me assume prescribing responsibilities for Latuda and Zolpidem.  Obesity- pt is down 5 lbs since last visit.  Pt has stopped drinking wine and is now walking daily.  ROS:   See pertinent positives and negatives per HPI.  Patient Active Problem List   Diagnosis Date Noted  . B12 deficiency 09/05/2019  . Obesity (BMI 30-39.9) 10/08/2018  . OAB (overactive bladder) 02/19/2017  . Right anterior knee pain 03/07/2016  . Lumbar radiculopathy 12/24/2015  . Solitary pulmonary nodule on lung CT 06/07/2014  . Trigger finger of right hand 12/26/2013  . General medical examination 07/15/2011  . PAIN IN JOINT, MULTIPLE SITES 09/20/2009  .  Essential hypertension 10/02/2008  . GOUTY ARTHROPATHY 06/12/2008  . Depression with anxiety 05/03/2007  . ALLERGIC RHINITIS 05/03/2007  . TOTAL HYSTERECTOMY AND BILATERAL SALPINGOOPHERECTOMY, HX OF 05/03/2007    Social History   Tobacco Use  . Smoking status: Never Smoker  . Smokeless tobacco: Never Used  Substance Use Topics  . Alcohol use: Yes    Alcohol/week: 5.0 standard drinks    Types: 5 Standard drinks or equivalent per week    Current Outpatient Medications:  .  amLODipine (NORVASC) 5 MG tablet, TAKE 1 TABLET(5 MG) BY MOUTH DAILY, Disp: 90 tablet, Rfl: 0 .  Biotin 5 MG CAPS, Take by mouth daily., Disp: , Rfl:  .  Cholecalciferol (D3 ADULT PO), Take by mouth daily., Disp: , Rfl:  .  estrogen-methylTESTOSTERone (ESTRATEST) 1.25-2.5 MG per tablet, Take 1 tablet by mouth daily., Disp: , Rfl:  .  LATUDA 40 MG TABS tablet, Take 40 mg by mouth at bedtime., Disp: , Rfl:  .  Loratadine (CLARITIN PO), Take by mouth daily., Disp: , Rfl:  .  mirabegron ER (MYRBETRIQ) 25 MG TB24 tablet, TAKE 1 TABLET(25 MG) BY MOUTH DAILY, Disp: 90 tablet, Rfl: 0 .  olmesartan-hydrochlorothiazide (BENICAR HCT) 40-12.5 MG tablet, Take 1 tablet by mouth daily., Disp: 90 tablet, Rfl: 0 .  zolpidem (AMBIEN) 10 MG tablet, Take 5 mg by mouth at bedtime as needed. For sleep, Disp: , Rfl:   No Known Allergies  Objective:   BP 116/65   Pulse 81   Ht 5\' 4"  (1.626 m)   Wt 205 lb (93 kg)   BMI 35.19 kg/m  Pt is able to speak  clearly, coherently without shortness of breath or increased work of breathing. Thought process is linear.  Mood is appropriate.   Assessment and Plan:   ? OSA- Pt had appt w/ Retinal Specialist ([facility]) who indicated that possible untreated sleep apnea may be contributing to early macular degeneration.  She admits to waking up frequently at night and husband reports snoring.  She has excessive daytime sleepiness and will fall asleep reading.  Will refer to pulmonary for complete  evaluation.  Pt expressed understanding and is in agreement w/ plan.   Anxiety/Depression- pt would like me to start prescribing her Latuda and Ambien due to some issues w/ her current psych office.  Pt to notify me when she is due for refills.  Pt expressed understanding and is in agreement w/ plan.   Obesity- pt is down 5 lbs.  Her weight loss will improve her OSA sxs.  Will follow.  Annye Asa, MD 12/14/2019

## 2019-12-16 HISTORY — PX: COLONOSCOPY: SHX174

## 2019-12-16 HISTORY — PX: POLYPECTOMY: SHX149

## 2019-12-30 ENCOUNTER — Other Ambulatory Visit: Payer: Self-pay | Admitting: General Practice

## 2019-12-30 MED ORDER — MIRABEGRON ER 25 MG PO TB24: ORAL_TABLET | ORAL | 0 refills | Status: DC

## 2020-01-06 ENCOUNTER — Other Ambulatory Visit: Payer: Self-pay | Admitting: General Practice

## 2020-01-06 MED ORDER — AMLODIPINE BESYLATE 5 MG PO TABS: ORAL_TABLET | ORAL | 0 refills | Status: DC

## 2020-01-10 ENCOUNTER — Institutional Professional Consult (permissible substitution): Payer: BC Managed Care – PPO | Admitting: Pulmonary Disease

## 2020-02-03 ENCOUNTER — Other Ambulatory Visit: Payer: Self-pay | Admitting: General Practice

## 2020-02-03 MED ORDER — OLMESARTAN MEDOXOMIL-HCTZ 40-12.5 MG PO TABS: 1.0000 | ORAL_TABLET | Freq: Every day | ORAL | 1 refills | Status: DC

## 2020-04-04 ENCOUNTER — Other Ambulatory Visit: Payer: Self-pay | Admitting: Family Medicine

## 2020-04-04 MED ORDER — LATUDA 40 MG PO TABS: 40.0000 mg | ORAL_TABLET | Freq: Every day | ORAL | 1 refills | Status: DC

## 2020-04-04 NOTE — Telephone Encounter (Signed)
Please advise, this is listed as a historical provider

## 2020-04-04 NOTE — Telephone Encounter (Signed)
Pt called in asking for a refill on the Latuda 40 mgs tabs, pt uses [facility] on [address]. Please advise

## 2020-04-09 ENCOUNTER — Other Ambulatory Visit: Payer: Self-pay

## 2020-04-09 MED ORDER — AMLODIPINE BESYLATE 5 MG PO TABS: ORAL_TABLET | ORAL | 0 refills | Status: DC

## 2020-04-11 ENCOUNTER — Other Ambulatory Visit: Payer: Self-pay | Admitting: General Practice

## 2020-04-11 MED ORDER — MIRABEGRON ER 25 MG PO TB24: ORAL_TABLET | ORAL | 0 refills | Status: DC

## 2020-04-12 ENCOUNTER — Encounter: Payer: Self-pay | Admitting: Internal Medicine

## 2020-05-10 ENCOUNTER — Ambulatory Visit (AMBULATORY_SURGERY_CENTER): Payer: Self-pay | Admitting: *Deleted

## 2020-05-10 ENCOUNTER — Other Ambulatory Visit: Payer: Self-pay

## 2020-05-10 VITALS — Ht 64.0 in | Wt 192.0 lb

## 2020-05-10 DIAGNOSIS — Z86010 Personal history of colonic polyps: Secondary | ICD-10-CM

## 2020-05-10 NOTE — Progress Notes (Signed)
plenvu sample given.  No egg or soy allergy known to patient  No issues with past sedation with any surgeries  or procedures, no intubation problems  No diet pills per patient No home 02 use per patient  No blood thinners per patient  Pt denies issues with constipation  No A fib or A flutter  EMMI video sent to pt's e mail   Due to the COVID-19 pandemic we are asking patients to follow these guidelines. Please only bring one care partner. Please be aware that your care partner may wait in the car in the parking lot or if they feel like they will be too hot to wait in the car, they may wait in the lobby on the 4th floor. All care partners are required to wear a mask the entire time (we do not have any that we can provide them), they need to practice social distancing, and we will do a Covid check for all patient's and care partners when you arrive. Also we will check their temperature and your temperature. If the care partner waits in their car they need to stay in the parking lot the entire time and we will call them on their cell phone when the patient is ready for discharge so they can bring the car to the front of the building. Also all patient's will need to wear a mask into building.

## 2020-05-16 ENCOUNTER — Encounter: Payer: Self-pay | Admitting: Internal Medicine

## 2020-05-21 ENCOUNTER — Encounter: Payer: Self-pay | Admitting: Family Medicine

## 2020-05-21 ENCOUNTER — Ambulatory Visit: Payer: BC Managed Care – PPO | Admitting: Family Medicine

## 2020-05-21 ENCOUNTER — Other Ambulatory Visit: Payer: Self-pay

## 2020-05-21 VITALS — BP 122/81 | HR 78 | Temp 97.9°F | Resp 16 | Ht 64.0 in | Wt 191.4 lb

## 2020-05-21 DIAGNOSIS — E669 Obesity, unspecified: Secondary | ICD-10-CM

## 2020-05-21 DIAGNOSIS — R5383 Other fatigue: Secondary | ICD-10-CM | POA: Diagnosis not present

## 2020-05-21 DIAGNOSIS — I10 Essential (primary) hypertension: Secondary | ICD-10-CM

## 2020-05-21 NOTE — Progress Notes (Signed)
   Subjective:    Patient ID: Diana Vincent, female    DOB: 1956/05/04, 64 y.o.   MRN: [id number]  HPI HTN- chronic problem, on Amlodipine 5mg  daily and Olmesartan HCTZ 40/12.5mg  daily w/ good control.  No CP, SOB, HAs, visual changes, edema.  Fatigue- pt reports she is always tired, frequently cold.  Skin is dry.    Obesity- BMI is 32.85  Pt is walking 1.5-2 miles daily.  Pt is down 15 lbs since December   Review of Systems For ROS see HPI   This visit occurred during the SARS-CoV-2 public health emergency.  Safety protocols were in place, including screening questions prior to the visit, additional usage of staff PPE, and extensive cleaning of exam room while observing appropriate contact time as indicated for disinfecting solutions.       Objective:   Physical Exam Vitals reviewed.  Constitutional:      General: She is not in acute distress.    Appearance: Normal appearance. She is well-developed.  HENT:     Head: Normocephalic and atraumatic.  Eyes:     Conjunctiva/sclera: Conjunctivae normal.     Pupils: Pupils are equal, round, and reactive to light.  Neck:     Thyroid: No thyromegaly.  Cardiovascular:     Rate and Rhythm: Normal rate and regular rhythm.     Heart sounds: Normal heart sounds. No murmur.  Pulmonary:     Effort: Pulmonary effort is normal. No respiratory distress.     Breath sounds: Normal breath sounds.  Abdominal:     General: There is no distension.     Palpations: Abdomen is soft.     Tenderness: There is no abdominal tenderness.  Musculoskeletal:     Cervical back: Normal range of motion and neck supple.  Lymphadenopathy:     Cervical: No cervical adenopathy.  Skin:    General: Skin is warm and dry.  Neurological:     Mental Status: She is alert and oriented to person, place, and time.  Psychiatric:        Behavior: Behavior normal.           Assessment & Plan:

## 2020-05-21 NOTE — Assessment & Plan Note (Signed)
Pt is down 15 lbs since last visit.  Applauded her efforts and encouraged her to continue.  Will follow.

## 2020-05-21 NOTE — Assessment & Plan Note (Signed)
Recurrent issue for pt.  In the past has had B12 deficiency.  Current sxs sound like it could be thyroid related.  Check labs to assess for metabolic cause and address any underlying issues if present.

## 2020-05-21 NOTE — Patient Instructions (Signed)
Schedule your complete physical in 6 months We'll notify you of your lab results and make any changes if needed Keep up the good work on healthy diet and regular exercise- you're doing great!!! Call with any questions or concerns Have a great summer!

## 2020-05-21 NOTE — Assessment & Plan Note (Signed)
Chronic problem.  Currently well controlled but is again having fatigue.  She has a hx of fatigue w/ HCTZ so if no other metabolic cause, may need to consider changing BP meds

## 2020-05-22 LAB — CBC WITH DIFFERENTIAL/PLATELET
Basophils Absolute: 0 10*3/uL (ref 0.0–0.1)
Basophils Relative: 0.3 % (ref 0.0–3.0)
Eosinophils Absolute: 0.1 10*3/uL (ref 0.0–0.7)
Eosinophils Relative: 1.1 % (ref 0.0–5.0)
HCT: 41.9 % (ref 36.0–46.0)
Hemoglobin: 14.4 g/dL (ref 12.0–15.0)
Lymphocytes Relative: 21.6 % (ref 12.0–46.0)
Lymphs Abs: 1.6 10*3/uL (ref 0.7–4.0)
MCHC: 34.4 g/dL (ref 30.0–36.0)
MCV: 95.2 fl (ref 78.0–100.0)
Monocytes Absolute: 0.7 10*3/uL (ref 0.1–1.0)
Monocytes Relative: 9.1 % (ref 3.0–12.0)
Neutro Abs: 4.9 10*3/uL (ref 1.4–7.7)
Neutrophils Relative %: 67.9 % (ref 43.0–77.0)
Platelets: 360 10*3/uL (ref 150.0–400.0)
RBC: 4.4 Mil/uL (ref 3.87–5.11)
RDW: 12.9 % (ref 11.5–15.5)
WBC: 7.2 10*3/uL (ref 4.0–10.5)

## 2020-05-22 LAB — HEPATIC FUNCTION PANEL
ALT: 23 U/L (ref 0–35)
AST: 17 U/L (ref 0–37)
Albumin: 4.1 g/dL (ref 3.5–5.2)
Alkaline Phosphatase: 49 U/L (ref 39–117)
Bilirubin, Direct: 0.1 mg/dL (ref 0.0–0.3)
Total Bilirubin: 0.4 mg/dL (ref 0.2–1.2)
Total Protein: 6.1 g/dL (ref 6.0–8.3)

## 2020-05-22 LAB — BASIC METABOLIC PANEL
BUN: 12 mg/dL (ref 6–23)
CO2: 28 mEq/L (ref 19–32)
Calcium: 9.5 mg/dL (ref 8.4–10.5)
Chloride: 101 mEq/L (ref 96–112)
Creatinine, Ser: 0.68 mg/dL (ref 0.40–1.20)
GFR: 87.25 mL/min (ref >60.00)
Glucose, Bld: 89 mg/dL (ref 70–99)
Potassium: 3.8 mEq/L (ref 3.5–5.1)
Sodium: 135 mEq/L (ref 135–145)

## 2020-05-22 LAB — VITAMIN D 25 HYDROXY (VIT D DEFICIENCY, FRACTURES): VITD: 34.8 ng/mL (ref 30.00–100.00)

## 2020-05-22 LAB — TSH: TSH: 1.22 u[IU]/mL (ref 0.35–4.50)

## 2020-05-22 LAB — B12 AND FOLATE PANEL
Folate: 10.8 ng/mL (ref >5.9)
Vitamin B-12: 473 pg/mL (ref 211–911)

## 2020-05-23 ENCOUNTER — Ambulatory Visit (AMBULATORY_SURGERY_CENTER): Payer: BC Managed Care – PPO | Admitting: Internal Medicine

## 2020-05-23 ENCOUNTER — Encounter: Payer: Self-pay | Admitting: Internal Medicine

## 2020-05-23 ENCOUNTER — Other Ambulatory Visit: Payer: Self-pay

## 2020-05-23 VITALS — BP 109/65 | HR 73 | Temp 96.7°F | Resp 13 | Ht 64.0 in | Wt 192.0 lb

## 2020-05-23 DIAGNOSIS — D125 Benign neoplasm of sigmoid colon: Secondary | ICD-10-CM | POA: Diagnosis not present

## 2020-05-23 DIAGNOSIS — D124 Benign neoplasm of descending colon: Secondary | ICD-10-CM | POA: Diagnosis not present

## 2020-05-23 DIAGNOSIS — D122 Benign neoplasm of ascending colon: Secondary | ICD-10-CM | POA: Diagnosis not present

## 2020-05-23 DIAGNOSIS — Z86010 Personal history of colonic polyps: Secondary | ICD-10-CM

## 2020-05-23 MED ORDER — SODIUM CHLORIDE 0.9 % IV SOLN: 500.0000 mL | Freq: Once | INTRAVENOUS | Status: DC

## 2020-05-23 NOTE — Progress Notes (Signed)
Pt's states no medical or surgical changes since previsit or office visit. 

## 2020-05-23 NOTE — Progress Notes (Signed)
Called to room to assist during endoscopic procedure.  Patient ID and intended procedure confirmed with present staff. Received instructions for my participation in the procedure from the performing physician.  

## 2020-05-23 NOTE — Patient Instructions (Signed)
Handouts given: polyps , Diverticulosis, Hemorrhoids Resume previous diet Continue \\present  medications Await pathology results  YOU HAD AN ENDOSCOPIC PROCEDURE TODAY AT [facility]:   Refer to the procedure report that was given to you for any specific questions about what was found during the examination.  If the procedure report does not answer your questions, please call your gastroenterologist to clarify.  If you requested that your care partner not be given the details of your procedure findings, then the procedure report has been included in a sealed envelope for you to review at your convenience later.  YOU SHOULD EXPECT: Some feelings of bloating in the abdomen. Passage of more gas than usual.  Walking can help get rid of the air that was put into your GI tract during the procedure and reduce the bloating. If you had a lower endoscopy (such as a colonoscopy or flexible sigmoidoscopy) you may notice spotting of blood in your stool or on the toilet paper. If you underwent a bowel prep for your procedure, you may not have a normal bowel movement for a few days.  Please Note:  You might notice some irritation and congestion in your nose or some drainage.  This is from the oxygen used during your procedure.  There is no need for concern and it should clear up in a day or so.  SYMPTOMS TO REPORT IMMEDIATELY:   Following lower endoscopy (colonoscopy or flexible sigmoidoscopy):  Excessive amounts of blood in the stool  Significant tenderness or worsening of abdominal pains  Swelling of the abdomen that is new, acute  Fever of 100F or higher  For urgent or emergent issues, a gastroenterologist can be reached at any hour by calling [phone]. Do not use MyChart messaging for urgent concerns.    DIET:  We do recommend a small meal at first, but then you may proceed to your regular diet.  Drink plenty of fluids but you should avoid alcoholic beverages for 24  hours.  ACTIVITY:  You should plan to take it easy for the rest of today and you should NOT DRIVE or use heavy machinery until tomorrow (because of the sedation medicines used during the test).    FOLLOW UP: Our staff will call the number listed on your records 48-72 hours following your procedure to check on you and address any questions or concerns that you may have regarding the information given to you following your procedure. If we do not reach you, we will leave a message.  We will attempt to reach you two times.  During this call, we will ask if you have developed any symptoms of COVID 19. If you develop any symptoms (ie: fever, flu-like symptoms, shortness of breath, cough etc.) before then, please call [phone].  If you test positive for Covid 19 in the 2 weeks post procedure, please call and report this information to [place].    If any biopsies were taken you will be contacted by phone or by letter within the next 1-3 weeks.  Please call us at [phone] if you have not heard about the biopsies in 3 weeks.    SIGNATURES/CONFIDENTIALITY: You and/or your care partner have signed paperwork which will be entered into your electronic medical record.  These signatures attest to the fact that that the information above on your After Visit Summary has been reviewed and is understood.  Full responsibility of the confidentiality of this discharge information lies with you and/or your care-partner.

## 2020-05-23 NOTE — Op Note (Signed)
[facility] Patient Name: Diana Vincent Procedure Date: 05/23/2020 9:20 AM MRN: [id number] Endoscopist: Jerene Bears , MD Age: 64 Referring MD:  Date of Birth: 1956-11-27 Gender: Female Account #: [id number] Procedure:                Colonoscopy Indications:              High risk colon cancer surveillance: Personal                            history of multiple adenomas, several > 10 mm, last                            colonoscopy Oct 2018 Medicines:                Monitored Anesthesia Care Procedure:                Pre-Anesthesia Assessment:                           - Prior to the procedure, a History and Physical                            was performed, and patient medications and                            allergies were reviewed. The patient's tolerance of                            previous anesthesia was also reviewed. The risks                            and benefits of the procedure and the sedation                            options and risks were discussed with the patient.                            All questions were answered, and informed consent                            was obtained. Prior Anticoagulants: The patient has                            taken no previous anticoagulant or antiplatelet                            agents. ASA Grade Assessment: II - A patient with                            mild systemic disease. After reviewing the risks                            and benefits, the patient was deemed in  satisfactory condition to undergo the procedure.                           After obtaining informed consent, the colonoscope                            was passed under direct vision. Throughout the                            procedure, the patient's blood pressure, pulse, and                            oxygen saturations were monitored continuously. The                            Colonoscope was introduced through the anus  and                            advanced to the cecum, identified by appendiceal                            orifice and ileocecal valve. The colonoscopy was                            performed without difficulty. The patient tolerated                            the procedure well. The quality of the bowel                            preparation was good. The ileocecal valve,                            appendiceal orifice, and rectum were photographed. Scope In: 9:40:07 AM Scope Out: 10:02:37 AM Scope Withdrawal Time: 0 hours 17 minutes 50 seconds  Total Procedure Duration: 0 hours 22 minutes 30 seconds  Findings:                 The digital rectal exam was normal.                           Three sessile polyps were found in the ascending                            colon. The polyps were 3 to 7 mm in size. These                            polyps were removed with a cold snare. Resection                            and retrieval were complete.                           A 5 mm polyp was found in the descending  colon. The                            polyp was sessile. The polyp was removed with a                            cold snare. Resection and retrieval were complete.                           Two sessile polyps were found in the sigmoid colon.                            The polyps were 8 to 9 mm in size. These polyps                            were removed with a cold snare. Resection and                            retrieval were complete.                           Multiple small-mouthed diverticula were found in                            the sigmoid colon and descending colon.                           Internal hemorrhoids were found during                            retroflexion. The hemorrhoids were small. Complications:            No immediate complications. Estimated Blood Loss:     Estimated blood loss was minimal. Impression:               - Three 3 to 7 mm polyps in the ascending  colon,                            removed with a cold snare. Resected and retrieved.                           - One 5 mm polyp in the descending colon, removed                            with a cold snare. Resected and retrieved.                           - Two 8 to 9 mm polyps in the sigmoid colon,                            removed with a cold snare. Resected and retrieved.                           - Diverticulosis in the sigmoid  colon and in the                            descending colon.                           - Small internal hemorrhoids. Recommendation:           - Patient has a contact number available for                            emergencies. The signs and symptoms of potential                            delayed complications were discussed with the                            patient. Return to normal activities tomorrow.                            Written discharge instructions were provided to the                            patient.                           - Resume previous diet.                           - Continue present medications.                           - Await pathology results.                           - Repeat colonoscopy is recommended for                            surveillance. The colonoscopy date will be                            determined after pathology results from today's                            exam become available for review. Jerene Bears, MD 05/23/2020 10:06:56 AM This report has been signed electronically.

## 2020-05-25 ENCOUNTER — Encounter: Payer: Self-pay | Admitting: Internal Medicine

## 2020-05-25 ENCOUNTER — Telehealth: Payer: Self-pay | Admitting: *Deleted

## 2020-05-25 NOTE — Telephone Encounter (Signed)
  Follow up Call-  Call back number 05/23/2020 10/08/2017  Post procedure Call Back phone  # [phone]  Permission to leave phone message Yes Yes  Some recent data might be hidden     Patient questions:  Message left to call us if necessary.

## 2020-05-25 NOTE — Telephone Encounter (Signed)
Attempted 2nd f/u phone call. No answer. Left message.  °

## 2020-05-29 LAB — HM MAMMOGRAPHY: HM Mammogram: NORMAL (ref 0–4)

## 2020-05-29 LAB — HM PAP SMEAR

## 2020-06-12 ENCOUNTER — Encounter: Payer: Self-pay | Admitting: General Practice

## 2020-06-28 ENCOUNTER — Other Ambulatory Visit: Payer: Self-pay | Admitting: Family Medicine

## 2020-07-02 ENCOUNTER — Other Ambulatory Visit: Payer: Self-pay | Admitting: Family Medicine

## 2020-07-02 MED ORDER — ZOLPIDEM TARTRATE 10 MG PO TABS: 5.0000 mg | ORAL_TABLET | Freq: Every evening | ORAL | 3 refills | Status: DC | PRN

## 2020-07-02 NOTE — Telephone Encounter (Signed)
Patient called and said that Dr. Birdie Riddle will be getting a refill request on her Zolpidiem - Patient states that she has talked to Dr. Birdie Riddle about getting this from her because it was prescribed by another doctor.  Please Advise

## 2020-07-02 NOTE — Telephone Encounter (Signed)
Please advise 

## 2020-07-17 ENCOUNTER — Encounter (INDEPENDENT_AMBULATORY_CARE_PROVIDER_SITE_OTHER): Payer: Self-pay | Admitting: Ophthalmology

## 2020-07-17 ENCOUNTER — Other Ambulatory Visit: Payer: Self-pay

## 2020-07-17 ENCOUNTER — Ambulatory Visit (INDEPENDENT_AMBULATORY_CARE_PROVIDER_SITE_OTHER): Payer: BC Managed Care – PPO | Admitting: Ophthalmology

## 2020-07-17 DIAGNOSIS — H353123 Nonexudative age-related macular degeneration, left eye, advanced atrophic without subfoveal involvement: Secondary | ICD-10-CM | POA: Insufficient documentation

## 2020-07-17 DIAGNOSIS — H353122 Nonexudative age-related macular degeneration, left eye, intermediate dry stage: Secondary | ICD-10-CM

## 2020-07-17 DIAGNOSIS — Z961 Presence of intraocular lens: Secondary | ICD-10-CM | POA: Diagnosis not present

## 2020-07-17 DIAGNOSIS — H353114 Nonexudative age-related macular degeneration, right eye, advanced atrophic with subfoveal involvement: Secondary | ICD-10-CM | POA: Insufficient documentation

## 2020-07-17 DIAGNOSIS — H353113 Nonexudative age-related macular degeneration, right eye, advanced atrophic without subfoveal involvement: Secondary | ICD-10-CM | POA: Diagnosis not present

## 2020-07-17 MED ORDER — FLUORESCEIN SODIUM 10 % IV SOLN
500.0000 mg | INTRAVENOUS | Status: AC | PRN
  Administered 2020-07-17: 500 mg via INTRAVENOUS

## 2020-07-17 NOTE — Progress Notes (Addendum)
07/17/2020     CHIEF COMPLAINT Patient presents for Retina Evaluation   HISTORY OF PRESENT ILLNESS: Diana Vincent is a 64 y.o. female who presents to the clinic today for:   HPI    Retina Evaluation    In both eyes.  Duration of 2 weeks.  Associated Symptoms Negative for Flashes and Floaters.  Context:  distance vision and near vision.  Treatments tried include no treatments.          Comments    Ref by [facility] - Mac deg OU Pt states that she is struggling to read directions on packaging. Pt states that she has to wear her old glasses for her to be able to read at all and cant find a pair of readers that work for her. Pt states there is also a 'gray blob' that takes up the center of her vision OD.  Pt using Azopt, Prednisolone, Prolensa TID OD       Last edited by Gerda Diss on 07/17/2020  9:23 AM. (History)      Referring physician: Midge Minium, MD [phone] [place],  [place] [address]  HISTORICAL INFORMATION:   Selected notes from the MEDICAL RECORD NUMBER    Lab Results  Component Value Date   HGBA1C 5.0 09/01/2012     CURRENT MEDICATIONS: No current outpatient medications on file. (Ophthalmic Drugs)   No current facility-administered medications for this visit. (Ophthalmic Drugs)   Current Outpatient Medications (Other)  Medication Sig  . amLODipine (NORVASC) 5 MG tablet TAKE 1 TABLET(5 MG) BY MOUTH DAILY  . Biotin 5 MG CAPS Take by mouth daily.  . Cholecalciferol (D3 ADULT PO) Take by mouth daily.  Marland Kitchen estrogen-methylTESTOSTERone (ESTRATEST) 1.25-2.5 MG per tablet Take 1 tablet by mouth daily.  Marland Kitchen LATUDA 40 MG TABS tablet Take 1 tablet (40 mg total) by mouth at bedtime.  . Loratadine (CLARITIN PO) Take by mouth daily.  . mirabegron ER (MYRBETRIQ) 25 MG TB24 tablet TAKE 1 TABLET(25 MG) BY MOUTH DAILY (Patient not taking: Reported on 05/23/2020)  . olmesartan-hydrochlorothiazide (BENICAR HCT) 40-12.5 MG tablet Take 1 tablet by mouth daily.  Marland Kitchen  zolpidem (AMBIEN) 10 MG tablet Take 0.5 tablets (5 mg total) by mouth at bedtime as needed. For sleep   No current facility-administered medications for this visit. (Other)      REVIEW OF SYSTEMS:    ALLERGIES No Known Allergies  PAST MEDICAL HISTORY Past Medical History:  Diagnosis Date  . Allergic rhinitis   . Anxiety   . Arthritis   . Cataract   . Depression   . Head injury    hairline fx from fall out of swing-   . Head injury without skull fracture    no LOC  . Hypertension    Past Surgical History:  Procedure Laterality Date  . ABDOMINAL HYSTERECTOMY  1990s  . ANTERIOR AND POSTERIOR REPAIR  07/07/2012   Procedure: ANTERIOR (CYSTOCELE) AND POSTERIOR REPAIR (RECTOCELE);  Surgeon: Daria Pastures, MD;  Location: [facility];  Service: Gynecology;  Laterality: N/A;  . BLADDER SUSPENSION  07/07/2012   Procedure: TRANSVAGINAL TAPE (TVT) PROCEDURE;  Surgeon: Daria Pastures, MD;  Location: [facility];  Service: Gynecology;  Laterality: N/A;  . CATARACT EXTRACTION Right    [facility]  . CATARACT EXTRACTION Left    [facility]  . CHOLECYSTECTOMY  2009  . COLONOSCOPY    . CYSTOSCOPY  07/07/2012   Procedure: CYSTOSCOPY;  Surgeon: Daria Pastures, MD;  Location:  [facility];  Service: Gynecology;;  . POLYPECTOMY    . TOTAL HIP ARTHROPLASTY Right     FAMILY HISTORY Family History  Problem Relation Age of Onset  . Leukemia Mother   . Hypertension Mother   . Cancer Father        skin  . Hypertension Father   . Colon cancer Neg Hx   . Colon polyps Neg Hx   . Stomach cancer Neg Hx   . Esophageal cancer Neg Hx     SOCIAL HISTORY Social History   Tobacco Use  . Smoking status: Never Smoker  . Smokeless tobacco: Never Used  Substance Use Topics  . Alcohol use: Not Currently  . Drug use: No         OPHTHALMIC EXAM:  Base Eye Exam    Visual Acuity (Snellen - Linear)      Right Left   Dist [place] 20/400 20/20   Dist ph [place] NI        Tonometry (Tonopen, 9:22 AM)       Right Left   Pressure 13 18       Pupils      Pupils Dark Light Shape React APD   Right PERRL 3 2 Round Brisk None   Left PERRL 3 2 Round Brisk None       Visual Fields (Counting fingers)      Left Right    Full Full       Extraocular Movement      Right Left    Full Full       Neuro/Psych    Oriented x3: Yes   Mood/Affect: Normal       Dilation    Both eyes: 1.0% Mydriacyl, 2.5% Phenylephrine @ 9:24 AM        Slit Lamp and Fundus Exam    External Exam      Right Left   External Normal Normal       Slit Lamp Exam      Right Left   Lids/Lashes Normal Normal   Conjunctiva/Sclera White and quiet White and quiet   Cornea Clear Clear   Anterior Chamber Deep and quiet Deep and quiet   Iris Round and reactive Round and reactive   Lens Posterior chamber intraocular lens Posterior chamber intraocular lens   Anterior Vitreous Normal Normal       Fundus Exam      Right Left   Posterior Vitreous Normal Normal   Disc Normal Normal   C/D Ratio 0.15 0.2   Macula Geographic atrophy in the foveal avascular zone, splits with small area of fixation light, Pigmented atrophy, in addition there is a reddish hue in this region which could suggest CNVM Geographic atrophy in a perifoveal location, not in the FAZ OS, Pigmented atrophy, no exudates, no macular thickening   Vessels Normal Normal   Periphery Normal Normal          IMAGING AND PROCEDURES  Imaging and Procedures for 07/17/20  OCT, Retina - OU - Both Eyes       Right Eye Quality was good. Scan locations included subfoveal. Progression has worsened. Findings include abnormal foveal contour, vitreomacular adhesion , outer retinal atrophy, no SRF, no IRF, subretinal hyper-reflective material.   Left Eye Quality was good. Scan locations included subfoveal. Progression has worsened. Findings include abnormal foveal contour, vitreomacular adhesion , outer retinal atrophy, central retinal atrophy, pigment  epithelial detachment.   Notes OD with loss of photoreceptor layer, I-S OS  line in the foveal region.  This is in conjunction with choriocapillaris dropout and subretinal hyper reflective material deposition, characteristic of progressive dry ARMD  OS, with perifoveal areas of Dry ARMD, loss of outer photoreceptor layer.  Small pigment epithelial detachment, subfoveal, and much improved from last visit some 9 months previous.  No signs of active CNVM.       Color Fundus Photography Optos - OU - Both Eyes       Right Eye Progression has no prior data. Disc findings include normal observations. Macula : flat, geographic atrophy. Vessels : normal observations. Periphery : normal observations.   Left Eye Progression has no prior data. Disc findings include normal observations. Macula : retinal pigment epithelium abnormalities, geographic atrophy, drusen, flat. Periphery : normal observations.   Notes OS with geographic patchy RPE atrophy, mostly perifoveal, no signs of active CNVM  OD with subfoveal geographic atrophy, no hemorrhage, no signs of active CNVM       Fluorescein Angiography Optos (Transit OD)       Injection:  500 mg Fluorescein Sodium 10 % injection   NDC: [phone]   Route: Intravenous, Site: Left ArmRight Eye   Progression has no prior data. Early phase findings include window defect. Mid/Late phase findings include window defect. Choroidal neovascularization is not present.   Left Eye   Progression has no prior data. Early phase findings include window defect. Mid/Late phase findings include window defect. Choroidal neovascularization is not present.   Notes OS, with no signs of active CNVM, no late leakages  OD, geographic atrophy in the subfoveal location with no signs of active CNVM                ASSESSMENT/PLAN:  Advanced nonexudative age-related macular degeneration of right eye without subfoveal involvement The nature of dry age  related macular degeneration was discussed with the patient as well as its possible conversion to wet. The results of the AREDS 2 study was discussed with the patient. A diet rich in dark leafy green vegetables was advised and specific recommendations were made regarding supplements with AREDS 2 formulation . Control of hypertension and serum cholesterol may slow the disease. Smoking cessation is mandatory to slow the disease and diminish the risk of progressing to wet age related macular degeneration. The patient was instructed in the use of an [place] and was told to return immediately for any changes in the Grid. Stressed to the patient do not rub eyes,,  This condition likely accounts for persistent impact on visual acuity despite recent cataract extraction and intraocular lens manipulations.      ICD-10-CM   1. Advanced nonexudative age-related macular degeneration of right eye without subfoveal involvement  H35.3113 OCT, Retina - OU - Both Eyes    Color Fundus Photography Optos - OU - Both Eyes    Fluorescein Angiography Optos (Transit OD)    Fluorescein Sodium 10 % injection 500 mg  2. Pseudophakia  Z96.1   3. Intermediate stage nonexudative age-related macular degeneration of left eye  H35.3122 Color Fundus Photography Optos - OU - Both Eyes    Fluorescein Angiography Optos (Transit OD)    Fluorescein Sodium 10 % injection 500 mg    1.  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  2.  Follow-up with Dr. Lucita Ferrara as scheduled yet I do not  believe that further lens manipulation will be is of assistance.  Of course all measures to affect appropriate refractive assistance may be undertaken under the direction of Dr. Lucita Ferrara  3.  Patient does continue on prednisolone acetate, Azopt  and PROLENSA under the direction of Dr. Lucita Ferrara after recent YAG laser procedure.  I will asked the patient to follow-up with Dr. Lucita Ferrara for further directions regarding their usage. Our plan here is to follow-up in 2 months for simple OCT testing with monitoring at home for visual distortions  Ophthalmic Meds Ordered this visit:  Meds ordered this encounter  Medications  . Fluorescein Sodium 10 % injection 500 mg       Return in about 2 months (around 09/16/2020) for DILATE OU, OCT.  There are no Patient Instructions on file for this visit.   Explained the diagnoses, plan, and follow up with the patient and they expressed understanding.  Patient expressed understanding of the importance of proper follow up care.   Clent Demark Dathan Attia M.D. Diseases & Surgery of the Retina and Vitreous Retina [facility] Diabetic [place] 07/17/20     Abbreviations: M myopia (nearsighted); A astigmatism; H hyperopia (farsighted); P presbyopia; Mrx spectacle prescription;  CTL contact lenses; OD right eye; OS left eye; OU both eyes  XT exotropia; ET esotropia; PEK punctate epithelial keratitis; PEE punctate epithelial erosions; DES dry eye syndrome; MGD meibomian gland dysfunction; ATs artificial tears; PFAT's preservative free artificial tears; [place] nuclear sclerotic cataract; PSC posterior subcapsular cataract; ERM epi-retinal membrane; PVD posterior vitreous detachment; RD retinal detachment; DM diabetes mellitus; DR diabetic retinopathy; NPDR non-proliferative diabetic retinopathy; PDR proliferative diabetic retinopathy; CSME clinically significant macular edema; DME diabetic macular edema; dbh dot blot hemorrhages; CWS cotton wool spot; POAG primary open angle glaucoma; C/D cup-to-disc ratio; HVF humphrey visual field; GVF goldmann visual field; OCT optical coherence tomography; IOP intraocular pressure; BRVO Branch retinal vein occlusion; CRVO central retinal vein occlusion; CRAO central retinal artery  occlusion; BRAO branch retinal artery occlusion; RT retinal tear; SB scleral buckle; PPV pars plana vitrectomy; VH Vitreous hemorrhage; PRP panretinal laser photocoagulation; IVK intravitreal kenalog; VMT vitreomacular traction; MH Macular hole;  NVD neovascularization of the disc; NVE neovascularization elsewhere; AREDS age related eye disease study; ARMD age related macular degeneration; POAG primary open angle glaucoma; EBMD epithelial/anterior basement membrane dystrophy; ACIOL anterior chamber intraocular lens; IOL intraocular lens; PCIOL posterior chamber intraocular lens; Phaco/IOL phacoemulsification with intraocular lens placement; [place] photorefractive keratectomy; LASIK laser assisted in situ keratomileusis; HTN hypertension; DM diabetes mellitus; COPD chronic obstructive pulmonary disease

## 2020-07-17 NOTE — Patient Instructions (Signed)
Monocular testing at home with reading glasses should be undertaken to look for new onset visual distortion and immediately report to the office

## 2020-07-17 NOTE — Assessment & Plan Note (Addendum)
The nature of dry age related macular degeneration was discussed with the patient as well as its possible conversion to wet. The results of the AREDS 2 study was discussed with the patient. A diet rich in dark leafy green vegetables was advised and specific recommendations were made regarding supplements with AREDS 2 formulation . Control of hypertension and serum cholesterol may slow the disease. Smoking cessation is mandatory to slow the disease and diminish the risk of progressing to wet age related macular degeneration. The patient was instructed in the use of an [place] and was told to return immediately for any changes in the Grid. Stressed to the patient do not rub eyes,,  This condition likely accounts for persistent impact on visual acuity despite recent cataract extraction and intraocular lens manipulations.

## 2020-08-01 ENCOUNTER — Other Ambulatory Visit: Payer: Self-pay | Admitting: Family Medicine

## 2020-09-03 ENCOUNTER — Other Ambulatory Visit: Payer: Self-pay | Admitting: General Practice

## 2020-09-03 NOTE — Telephone Encounter (Signed)
Last OV 05/21/20 Zolpidem last filled 07/02/20 #30 with 3

## 2020-09-06 ENCOUNTER — Telehealth: Payer: Self-pay | Admitting: Family Medicine

## 2020-09-06 NOTE — Telephone Encounter (Signed)
PA began in covermymeds.

## 2020-09-06 NOTE — Telephone Encounter (Signed)
Patient called pharmacy about refilling her Zolpidem - She was told that there are refills on the rx but that she would need a prior authorization to fill it - Pharmacy:  [facility], [place], - Please advise

## 2020-09-07 ENCOUNTER — Encounter: Payer: BC Managed Care – PPO | Admitting: Family Medicine

## 2020-09-17 ENCOUNTER — Other Ambulatory Visit: Payer: Self-pay

## 2020-09-17 ENCOUNTER — Ambulatory Visit (INDEPENDENT_AMBULATORY_CARE_PROVIDER_SITE_OTHER): Payer: BC Managed Care – PPO | Admitting: Ophthalmology

## 2020-09-17 ENCOUNTER — Encounter (INDEPENDENT_AMBULATORY_CARE_PROVIDER_SITE_OTHER): Payer: Self-pay | Admitting: Ophthalmology

## 2020-09-17 DIAGNOSIS — H353113 Nonexudative age-related macular degeneration, right eye, advanced atrophic without subfoveal involvement: Secondary | ICD-10-CM | POA: Diagnosis not present

## 2020-09-17 DIAGNOSIS — H353122 Nonexudative age-related macular degeneration, left eye, intermediate dry stage: Secondary | ICD-10-CM

## 2020-09-17 DIAGNOSIS — H353114 Nonexudative age-related macular degeneration, right eye, advanced atrophic with subfoveal involvement: Secondary | ICD-10-CM | POA: Diagnosis not present

## 2020-09-17 NOTE — Assessment & Plan Note (Signed)
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  All exacerbating factors reviewed with the patient including control of hypertension, sunglasses wearing outdoors and review of systems are negative for sleep apnea.

## 2020-09-17 NOTE — Assessment & Plan Note (Signed)
No signs of CNVM S.

## 2020-09-17 NOTE — Progress Notes (Signed)
09/17/2020     CHIEF COMPLAINT Patient presents for Retina Follow Up   HISTORY OF PRESENT ILLNESS: Diana Vincent is a 64 y.o. female who presents to the clinic today for:   HPI    Retina Follow Up    Patient presents with  Dry AMD.  In both eyes.  This started 2 months ago.  Severity is mild.  Duration of 2 months.  Since onset it is stable.          Comments    2 Month F/U OU  Pt reports minimal VA improvement following cataract sx OD since last visit. No new symptoms OU.       Last edited by Rockie Neighbours, [place] on 09/17/2020 10:48 AM. (History)      Referring physician: Midge Minium, [place] [address]  [place] [address]  HISTORICAL INFORMATION:   Selected notes from the MEDICAL RECORD NUMBER    Lab Results  Component Value Date   HGBA1C 5.0 09/01/2012     CURRENT MEDICATIONS: No current outpatient medications on file. (Ophthalmic Drugs)   No current facility-administered medications for this visit. (Ophthalmic Drugs)   Current Outpatient Medications (Other)  Medication Sig  . amLODipine (NORVASC) 5 MG tablet TAKE 1 TABLET(5 MG) BY MOUTH DAILY  . Biotin 5 MG CAPS Take by mouth daily.  . Cholecalciferol (D3 ADULT PO) Take by mouth daily.  Marland Kitchen estrogen-methylTESTOSTERone (ESTRATEST) 1.25-2.5 MG per tablet Take 1 tablet by mouth daily.  Marland Kitchen LATUDA 40 MG TABS tablet Take 1 tablet (40 mg total) by mouth at bedtime.  . Loratadine (CLARITIN PO) Take by mouth daily.  . mirabegron ER (MYRBETRIQ) 25 MG TB24 tablet TAKE 1 TABLET(25 MG) BY MOUTH DAILY (Patient not taking: Reported on 05/23/2020)  . olmesartan-hydrochlorothiazide (BENICAR HCT) 40-12.5 MG tablet TAKE 1 TABLET BY MOUTH DAILY  . zolpidem (AMBIEN) 10 MG tablet Take 0.5 tablets (5 mg total) by mouth at bedtime as needed. For sleep   No current facility-administered medications for this visit. (Other)      REVIEW OF SYSTEMS:    ALLERGIES No Known Allergies  PAST MEDICAL HISTORY Past  Medical History:  Diagnosis Date  . Allergic rhinitis   . Anxiety   . Arthritis   . Cataract   . Depression   . Head injury    hairline fx from fall out of swing-   . Head injury without skull fracture    no LOC  . Hypertension    Past Surgical History:  Procedure Laterality Date  . ABDOMINAL HYSTERECTOMY  1990s  . ANTERIOR AND POSTERIOR REPAIR  07/07/2012   Procedure: ANTERIOR (CYSTOCELE) AND POSTERIOR REPAIR (RECTOCELE);  Surgeon: Daria Pastures, MD;  Location: [facility];  Service: Gynecology;  Laterality: N/A;  . BLADDER SUSPENSION  07/07/2012   Procedure: TRANSVAGINAL TAPE (TVT) PROCEDURE;  Surgeon: Daria Pastures, MD;  Location: [facility];  Service: Gynecology;  Laterality: N/A;  . CATARACT EXTRACTION Right    [facility]  . CATARACT EXTRACTION Left    [facility]  . CHOLECYSTECTOMY  2009  . COLONOSCOPY    . CYSTOSCOPY  07/07/2012   Procedure: CYSTOSCOPY;  Surgeon: Daria Pastures, MD;  Location: [facility];  Service: Gynecology;;  . POLYPECTOMY    . TOTAL HIP ARTHROPLASTY Right     FAMILY HISTORY Family History  Problem Relation Age of Onset  . Leukemia Mother   . Hypertension Mother   . Cancer Father  skin  . Hypertension Father   . Colon cancer Neg Hx   . Colon polyps Neg Hx   . Stomach cancer Neg Hx   . Esophageal cancer Neg Hx     SOCIAL HISTORY Social History   Tobacco Use  . Smoking status: Never Smoker  . Smokeless tobacco: Never Used  Substance Use Topics  . Alcohol use: Not Currently  . Drug use: No         OPHTHALMIC EXAM: Base Eye Exam    Visual Acuity (ETDRS)      Right Left   Dist [place] CF at 3' 20/25 +2   Dist ph [place] NI        Tonometry (Tonopen, 10:50 AM)      Right Left   Pressure 15 15       Pupils      Pupils Dark Light Shape React APD   Right PERRL 3 2 Round Brisk None   Left PERRL 3 2 Round Brisk None       Visual Fields (Counting fingers)      Left Right    Full Full       Extraocular Movement      Right  Left    Full Full       Neuro/Psych    Oriented x3: Yes   Mood/Affect: Normal       Dilation    Both eyes: 1.0% Mydriacyl, 2.5% Phenylephrine @ 10:52 AM        Slit Lamp and Fundus Exam    External Exam      Right Left   External Normal Normal       Slit Lamp Exam      Right Left   Lids/Lashes Normal Normal   Conjunctiva/Sclera White and quiet White and quiet   Cornea Clear Clear   Anterior Chamber Deep and quiet Deep and quiet   Iris Round and reactive Round and reactive   Lens Posterior chamber intraocular lens Posterior chamber intraocular lens   Anterior Vitreous Normal Normal       Fundus Exam      Right Left   Posterior Vitreous Normal Normal   Disc Normal Normal   C/D Ratio 0.15 0.2   Macula Geographic atrophy in the foveal avascular zone, splits with small area of fixation light, Pigmented atrophy, in addition there is a reddish hue in this region which could suggest CNVM, no exudates, no hemorrhage, no macular thickening Geographic atrophy in a perifoveal location, not in the FAZ OS, Pigmented atrophy, no exudates, no macular thickening   Vessels Normal Normal   Periphery Normal Normal          IMAGING AND PROCEDURES  Imaging and Procedures for 09/17/20  OCT, Retina - OU - Both Eyes       Right Eye Quality was good. Scan locations included subfoveal. Central Foveal Thickness: 203. Progression has worsened. Findings include outer retinal atrophy, vitreomacular adhesion .   Left Eye Quality was good. Scan locations included subfoveal. Central Foveal Thickness: 238. Progression has been stable. Findings include vitreomacular adhesion .   Notes Foveal RPE and choriocapillaris atrophy and loss of the photoreceptor layer, OD  No signs of active CNVM OD.  OD with outer retinal atrophy, choriocapillaris and RPE dropout with loss of the photoreceptor layer.  No active CNVM.  OS with similar perifoveal RPE atrophy and chronic pigment epithelial detachment  present over the last 1-1/2 years.  Change over time.  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  All exacerbating factors reviewed with the patient including control of hypertension, sunglasses wearing outdoors and review of systems are negative for sleep apnea.      ICD-10-CM   1. Advanced nonexudative age-related macular degeneration of right eye without subfoveal involvement  H35.3113 OCT, Retina - OU - Both Eyes    1.  No active therapeutic intervention required today.  2.  Asked to report any new onset visual acuity decline or profound distortion  3.  Ophthalmic Meds Ordered this visit:  No orders of the defined types were placed in this encounter.      Return in about 5 months (around 02/15/2021) for DILATE OU, OCT.  There are no Patient Instructions on file for this visit.   Explained the diagnoses, plan, and follow up with the patient and they expressed understanding.  Patient expressed understanding of the importance of proper follow up care.   Clent Demark Doloris Servantes M.D. Diseases & Surgery of the Retina and Vitreous Retina & Diabetic [place] 09/17/20     Abbreviations: M myopia (nearsighted); A astigmatism; H hyperopia (farsighted); P presbyopia; Mrx spectacle prescription;  CTL contact lenses; OD right eye; OS left eye; OU both eyes  XT exotropia; ET esotropia; PEK punctate epithelial keratitis; PEE punctate epithelial erosions; DES dry eye syndrome; MGD meibomian gland dysfunction; ATs artificial tears; PFAT's preservative free artificial tears; [place] nuclear sclerotic cataract; PSC posterior subcapsular cataract; ERM epi-retinal membrane; PVD posterior vitreous detachment; RD retinal detachment; DM diabetes mellitus; DR diabetic retinopathy; NPDR  non-proliferative diabetic retinopathy; PDR proliferative diabetic retinopathy; CSME clinically significant macular edema; DME diabetic macular edema; dbh dot blot hemorrhages; CWS cotton wool spot; POAG primary open angle glaucoma; C/D cup-to-disc ratio; HVF humphrey visual field; GVF goldmann visual field; OCT optical coherence tomography; IOP intraocular pressure; BRVO Branch retinal vein occlusion; CRVO central retinal vein occlusion; CRAO central retinal artery occlusion; BRAO branch retinal artery occlusion; RT retinal tear; SB scleral buckle; PPV pars plana vitrectomy; VH Vitreous hemorrhage; PRP panretinal laser photocoagulation; IVK intravitreal kenalog; VMT vitreomacular traction; MH Macular hole;  NVD neovascularization of the disc; NVE neovascularization elsewhere; AREDS age related eye disease study; ARMD age related macular degeneration; POAG primary open angle glaucoma; EBMD epithelial/anterior basement membrane dystrophy; ACIOL anterior chamber intraocular lens; IOL intraocular lens; PCIOL posterior chamber intraocular lens; Phaco/IOL phacoemulsification with intraocular lens placement; [place] photorefractive keratectomy; LASIK laser assisted in situ keratomileusis; HTN hypertension; DM diabetes mellitus; COPD chronic obstructive pulmonary disease

## 2020-09-26 ENCOUNTER — Other Ambulatory Visit: Payer: Self-pay

## 2020-09-26 ENCOUNTER — Encounter: Payer: Self-pay | Admitting: Family Medicine

## 2020-09-26 ENCOUNTER — Ambulatory Visit (INDEPENDENT_AMBULATORY_CARE_PROVIDER_SITE_OTHER): Payer: BC Managed Care – PPO | Admitting: Family Medicine

## 2020-09-26 VITALS — BP 110/69 | HR 72 | Temp 97.9°F | Resp 16 | Ht 64.0 in | Wt 186.4 lb

## 2020-09-26 DIAGNOSIS — E538 Deficiency of other specified B group vitamins: Secondary | ICD-10-CM | POA: Diagnosis not present

## 2020-09-26 DIAGNOSIS — F329 Major depressive disorder, single episode, unspecified: Secondary | ICD-10-CM

## 2020-09-26 DIAGNOSIS — Z0000 Encounter for general adult medical examination without abnormal findings: Secondary | ICD-10-CM

## 2020-09-26 DIAGNOSIS — E669 Obesity, unspecified: Secondary | ICD-10-CM | POA: Diagnosis not present

## 2020-09-26 DIAGNOSIS — Z23 Encounter for immunization: Secondary | ICD-10-CM

## 2020-09-26 LAB — VITAMIN B12: Vitamin B-12: >1526 pg/mL — ABNORMAL HIGH (ref 211–911)

## 2020-09-26 LAB — HEPATIC FUNCTION PANEL
ALT: 18 U/L (ref 0–35)
AST: 16 U/L (ref 0–37)
Albumin: 4.2 g/dL (ref 3.5–5.2)
Alkaline Phosphatase: 47 U/L (ref 39–117)
Bilirubin, Direct: 0.2 mg/dL (ref 0.0–0.3)
Total Bilirubin: 0.6 mg/dL (ref 0.2–1.2)
Total Protein: 6.6 g/dL (ref 6.0–8.3)

## 2020-09-26 LAB — CBC WITH DIFFERENTIAL/PLATELET
Basophils Absolute: 0 10*3/uL (ref 0.0–0.1)
Basophils Relative: 0.3 % (ref 0.0–3.0)
Eosinophils Absolute: 0.1 10*3/uL (ref 0.0–0.7)
Eosinophils Relative: 1.2 % (ref 0.0–5.0)
HCT: 45.8 % (ref 36.0–46.0)
Hemoglobin: 15.7 g/dL — ABNORMAL HIGH (ref 12.0–15.0)
Lymphocytes Relative: 20.3 % (ref 12.0–46.0)
Lymphs Abs: 1.3 10*3/uL (ref 0.7–4.0)
MCHC: 34.3 g/dL (ref 30.0–36.0)
MCV: 94.1 fl (ref 78.0–100.0)
Monocytes Absolute: 0.6 10*3/uL (ref 0.1–1.0)
Monocytes Relative: 9.1 % (ref 3.0–12.0)
Neutro Abs: 4.4 10*3/uL (ref 1.4–7.7)
Neutrophils Relative %: 69.1 % (ref 43.0–77.0)
Platelets: 380 10*3/uL (ref 150.0–400.0)
RBC: 4.87 Mil/uL (ref 3.87–5.11)
RDW: 13.4 % (ref 11.5–15.5)
WBC: 6.4 10*3/uL (ref 4.0–10.5)

## 2020-09-26 LAB — LIPID PANEL
Cholesterol: 122 mg/dL (ref 0–200)
HDL: 56 mg/dL (ref >39.00)
LDL Cholesterol: 49 mg/dL (ref 0–99)
NonHDL: 65.59
Total CHOL/HDL Ratio: 2
Triglycerides: 82 mg/dL (ref 0.0–149.0)
VLDL: 16.4 mg/dL (ref 0.0–40.0)

## 2020-09-26 LAB — BASIC METABOLIC PANEL
BUN: 12 mg/dL (ref 6–23)
CO2: 31 mEq/L (ref 19–32)
Calcium: 9.4 mg/dL (ref 8.4–10.5)
Chloride: 99 mEq/L (ref 96–112)
Creatinine, Ser: 0.71 mg/dL (ref 0.40–1.20)
GFR: 90.1 mL/min (ref >60.00)
Glucose, Bld: 81 mg/dL (ref 70–99)
Potassium: 4.1 mEq/L (ref 3.5–5.1)
Sodium: 139 mEq/L (ref 135–145)

## 2020-09-26 LAB — TSH: TSH: 1.44 u[IU]/mL (ref 0.35–4.50)

## 2020-09-26 MED ORDER — LAMOTRIGINE 100 MG PO TABS
ORAL_TABLET | ORAL | 0 refills | Status: DC
Start: 2020-09-26 — End: 2020-12-17

## 2020-09-26 MED ORDER — LAMOTRIGINE 25 MG PO TABS
ORAL_TABLET | ORAL | 0 refills | Status: DC
Start: 2020-09-26 — End: 2020-12-17

## 2020-09-26 MED ORDER — AMLODIPINE BESYLATE 5 MG PO TABS: 5.0000 mg | ORAL_TABLET | Freq: Every day | ORAL | 1 refills | Status: DC

## 2020-09-26 NOTE — Progress Notes (Signed)
Subjective:    Patient ID: Diana Vincent, female    DOB: Oct 01, 1956, 64 y.o.   MRN: [id number]  HPI CPE- UTD on pap, colonoscopy, mammogram, Tdap, COVID vaccines.  Due for flu.  Pt is down 6 lbs since changing diet and walking daily  Reviewed past medical, surgical, family and social histories.   Patient Care Team    Relationship Specialty Notifications Start End  Diana Minium, MD PCP - General Family Medicine  04/02/16   Bobbye Charleston, MD Consulting Physician Obstetrics and Gynecology  06/10/17   Jerene Bears, MD Consulting Physician Gastroenterology  09/26/20      Health Maintenance  Topic Date Due   INFLUENZA VACCINE  07/15/2020   Hepatitis C Screening  09/26/2021 (Originally 27-Sep-1956)   HIV Screening  09/26/2021 (Originally 11/20/1971)   MAMMOGRAM  05/29/2021   COLONOSCOPY  05/24/2023   TETANUS/TDAP  11/02/2027   COVID-19 Vaccine  Completed      Review of Systems Patient reports no vision/ hearing changes, adenopathy,fever, persistant/recurrent hoarseness , swallowing issues, chest pain, palpitations, edema, persistant/recurrent cough, hemoptysis, dyspnea (rest/exertional/paroxysmal nocturnal), gastrointestinal bleeding (melena, rectal bleeding), abdominal pain, significant heartburn, bowel changes, GU symptoms (dysuria, hematuria, incontinence), Gyn symptoms (abnormal  bleeding, pain),  syncope, focal weakness, memory loss, numbness & tingling, skin/hair/nail changes, abnormal bruising or bleeding, anxiety, or depression.   Pt has developed a tremor in her L hand and worries that this is a Latuda side effect.  Would like to switch back to Lamictal (previously saw Pauline Good- stopped seeing her during [place]).  sxs started w/in the last 6 months.  She describes tremor as when she is attempting to do something.  Able to hold hand out steady.  This visit occurred during the SARS-CoV-2 public health emergency.  Safety protocols were in place, including screening  questions prior to the visit, additional usage of staff PPE, and extensive cleaning of exam room while observing appropriate contact time as indicated for disinfecting solutions.       Objective:   Physical Exam General Appearance:    Alert, cooperative, no distress, appears stated age, obese  Head:    Normocephalic, without obvious abnormality, atraumatic  Eyes:    PERRL, conjunctiva/corneas clear, EOM's intact, fundi    benign, both eyes  Ears:    Normal TM's and external ear canals, both ears  Nose:   Deferred due to COVID  Throat:   Neck:   Supple, symmetrical, trachea midline, no adenopathy;    Thyroid: no enlargement/tenderness/nodules  Back:     Symmetric, no curvature, ROM normal, no CVA tenderness  Lungs:     Clear to auscultation bilaterally, respirations unlabored  Chest Wall:    No tenderness or deformity   Heart:    Regular rate and rhythm, S1 and S2 normal, no murmur, rub   or gallop  Breast Exam:    Deferred to GYN  Abdomen:     Soft, non-tender, bowel sounds active all four quadrants,    no masses, no organomegaly  Genitalia:    Deferred to GYN  Rectal:    Extremities:   Extremities normal, atraumatic, no cyanosis or edema  Pulses:   2+ and symmetric all extremities  Skin:   Skin color, texture, turgor normal, no rashes or lesions  Lymph nodes:   Cervical, supraclavicular, and axillary nodes normal  Neurologic:   CNII-XII intact, normal strength, sensation and reflexes    throughout          Assessment & Plan:

## 2020-09-26 NOTE — Assessment & Plan Note (Signed)
Pt's PE WNL w/ exception of obesity.  UTD on pap, mammo, colonoscopy, Tdap, COVID.  Got flu today.  Check labs.  Anticipatory guidance provided.

## 2020-09-26 NOTE — Patient Instructions (Signed)
Follow up in 4-6 weeks to recheck mood We'll notify you of your lab results and make any changes if needed Decrease the Latuda to 1/2 tab daily x2 weeks and then every other day for 1-2 weeks and then stop START the Lamictal 1 tab daily (25mg ) x2 weeks and then increase to 2 tabs (50mg ) daily x2 weeks AFTER the first month of Lamictal, start the 100mg  1 tab daily x2 weeks and then 2 tabs (200mg ) daily Continue to work on healthy diet and regular exercise- you're doing it! Call with any questions or concerns Stay Safe! Stay Healthy!

## 2020-09-26 NOTE — Assessment & Plan Note (Signed)
Ongoing issue.  Pt is fearful that she is developing an intention tremor from the [place] and would like to switch back to Lamictal.  Discussed the appropriate way to taper the Latuda and how to ramp up the Lamictal (pt also received written directions).  Will follow closely for mood changes and resolution of tremor.  Pt expressed understanding and is in agreement w/ plan.

## 2020-09-26 NOTE — Assessment & Plan Note (Signed)
Pt is down 6 lbs since last visit.  Applauded her efforts at changing her diet and exercising daily.  Will continue to follow.

## 2020-09-26 NOTE — Assessment & Plan Note (Signed)
Pt has hx of this.  Check labs and replete prn. 

## 2020-09-27 ENCOUNTER — Encounter: Payer: Self-pay | Admitting: General Practice

## 2020-11-07 ENCOUNTER — Ambulatory Visit: Payer: BC Managed Care – PPO | Admitting: Family Medicine

## 2020-11-28 ENCOUNTER — Telehealth: Payer: Self-pay

## 2020-11-28 MED ORDER — ZOLPIDEM TARTRATE 10 MG PO TABS
5.0000 mg | ORAL_TABLET | Freq: Every evening | ORAL | 3 refills | Status: DC | PRN
Start: 2020-11-28 — End: 2021-03-25

## 2020-11-28 NOTE — Telephone Encounter (Signed)
Prescription sent

## 2020-12-03 DIAGNOSIS — M545 Low back pain, unspecified: Secondary | ICD-10-CM | POA: Insufficient documentation

## 2020-12-05 ENCOUNTER — Encounter: Payer: Self-pay | Admitting: Family Medicine

## 2020-12-05 ENCOUNTER — Telehealth (INDEPENDENT_AMBULATORY_CARE_PROVIDER_SITE_OTHER): Payer: BC Managed Care – PPO | Admitting: Family Medicine

## 2020-12-05 VITALS — BP 114/68

## 2020-12-05 DIAGNOSIS — F329 Major depressive disorder, single episode, unspecified: Secondary | ICD-10-CM

## 2020-12-05 NOTE — Progress Notes (Signed)
I connected with  Diana Vincent on 12/05/20 by a video enabled telemedicine application and verified that I am speaking with the correct person using two identifiers.   I discussed the limitations of evaluation and management by telemedicine. The patient expressed understanding and agreed to proceed.

## 2020-12-05 NOTE — Progress Notes (Signed)
Virtual Visit via Video   I connected with patient on 12/05/20 at  1:00 PM EST by a video enabled telemedicine application and verified that I am speaking with the correct person using two identifiers.  Location patient: Home Location provider: [facility], Office Persons participating in the virtual visit: Patient, Provider, [place] (Sabrina M)  I discussed the limitations of evaluation and management by telemedicine and the availability of in person appointments. The patient expressed understanding and agreed to proceed.  Subjective:   HPI:   Depression- at last visit pt was tapered off the [place] and was restarted on the Lamictal.  'I feel good'  Tremor has improved since stopping Latuda.  Feels less stressed.  Having holidays w/ family.  Pt feels less irritable, less tense, less sad.    ROS:   See pertinent positives and negatives per HPI.  Patient Active Problem List   Diagnosis Date Noted  . Lumbar pain 12/03/2020  . Pseudophakia 07/17/2020  . Advanced nonexudative age-related macular degeneration of right eye with subfoveal involvement 07/17/2020  . Intermediate stage nonexudative age-related macular degeneration of left eye 07/17/2020  . Fatigue 05/21/2020  . B12 deficiency 09/05/2019  . Obesity (BMI 30-39.9) 10/08/2018  . OAB (overactive bladder) 02/19/2017  . Right anterior knee pain 03/07/2016  . Lumbar radiculopathy 12/24/2015  . Solitary pulmonary nodule on lung CT 06/07/2014  . Trigger finger of right hand 12/26/2013  . General medical examination 07/15/2011  . PAIN IN JOINT, MULTIPLE SITES 09/20/2009  . Essential hypertension 10/02/2008  . GOUTY ARTHROPATHY 06/12/2008  . Chronic major depressive disorder 05/03/2007  . ALLERGIC RHINITIS 05/03/2007  . TOTAL HYSTERECTOMY AND BILATERAL SALPINGOOPHERECTOMY, HX OF 05/03/2007    Social History   Tobacco Use  . Smoking status: Never Smoker  . Smokeless tobacco: Never Used  Substance Use Topics  .  Alcohol use: Not Currently    Current Outpatient Medications:  .  amLODipine (NORVASC) 5 MG tablet, Take 1 tablet (5 mg total) by mouth daily., Disp: 90 tablet, Rfl: 1 .  Biotin 5 MG CAPS, Take by mouth daily., Disp: , Rfl:  .  Cholecalciferol (D3 ADULT PO), Take by mouth daily., Disp: , Rfl:  .  estrogen-methylTESTOSTERone (ESTRATEST) 1.25-2.5 MG per tablet, Take 1 tablet by mouth daily., Disp: , Rfl:  .  gabapentin (NEURONTIN) 300 MG capsule, gabapentin 300 mg capsule  Take 1 capsule as needed by oral route at bedtime for 30 days., Disp: , Rfl:  .  Loratadine (CLARITIN PO), Take by mouth daily., Disp: , Rfl:  .  olmesartan-hydrochlorothiazide (BENICAR HCT) 40-12.5 MG tablet, TAKE 1 TABLET BY MOUTH DAILY, Disp: 90 tablet, Rfl: 1 .  predniSONE (DELTASONE) 5 MG tablet, prednisone 5 mg tablets in a dose pack  Take 1 dose pk by oral route as directed for 6 days., Disp: , Rfl:  .  zolpidem (AMBIEN) 10 MG tablet, Take 0.5 tablets (5 mg total) by mouth at bedtime as needed. For sleep, Disp: 30 tablet, Rfl: 3 .  lamoTRIgine (LAMICTAL) 100 MG tablet, Take 1 tablet (100 mg total) by mouth daily for 14 days, THEN 2 tablets (200 mg total) daily for 14 days., Disp: 42 tablet, Rfl: 0 .  lamoTRIgine (LAMICTAL) 25 MG tablet, Take 1 tablet (25 mg total) by mouth daily for 14 days, THEN 2 tablets (50 mg total) daily for 14 days., Disp: 42 tablet, Rfl: 0 .  LATUDA 40 MG TABS tablet, Take 1 tablet (40 mg total) by mouth at bedtime. (Patient  not taking: Reported on 12/05/2020), Disp: 90 tablet, Rfl: 1 .  mirabegron ER (MYRBETRIQ) 25 MG TB24 tablet, TAKE 1 TABLET(25 MG) BY MOUTH DAILY (Patient not taking: Reported on 12/05/2020), Disp: 90 tablet, Rfl: 0  No Known Allergies  Objective:   BP 114/68  AAOx3, NAD NCAT, EOMI No obvious CN deficits Coloring WNL Pt is able to speak clearly, coherently without shortness of breath or increased work of breathing.  Thought process is linear.  Mood is appropriate.    Assessment and Plan:   Depression- much improved since stopping the [place].  Tremor has improved.  Mood is doing well.  No changes at this time.  Will follow.  Annye Asa, MD 12/05/2020

## 2020-12-14 ENCOUNTER — Other Ambulatory Visit: Payer: Self-pay | Admitting: Family Medicine

## 2020-12-17 ENCOUNTER — Other Ambulatory Visit: Payer: Self-pay | Admitting: Family Medicine

## 2020-12-17 MED ORDER — LAMOTRIGINE 200 MG PO TABS: 200.0000 mg | ORAL_TABLET | Freq: Every day | ORAL | 3 refills | Status: DC

## 2020-12-17 NOTE — Telephone Encounter (Signed)
Patient called in and said that she is completely out of this medication.  - Please send in today.

## 2020-12-17 NOTE — Telephone Encounter (Signed)
Please advise on instructions. LFD 09/26/20 #42 with no refills LOV 12/05/20 NOV none

## 2020-12-17 NOTE — Progress Notes (Signed)
Prescription was changed to 200mg  tablet 1 tab daily rather than 2 tabs daily.  Please let pt know

## 2020-12-21 ENCOUNTER — Telehealth: Payer: Self-pay

## 2020-12-21 NOTE — Telephone Encounter (Signed)
Patient called in regards to Lamictal 200mg . She feels better taking the 10mg  instead of the 200mg . Please advise.

## 2020-12-24 ENCOUNTER — Telehealth: Payer: Self-pay

## 2020-12-24 NOTE — Telephone Encounter (Signed)
Pt is scheduled for 01/14/21 at 1 for a v/v with Dr. Birdie Riddle

## 2020-12-24 NOTE — Telephone Encounter (Signed)
Ok to remain at 100 mg. She needs to schedule follow-up with PCP in 2-3 weeks to reassess and make further adjustments if needed.

## 2020-12-24 NOTE — Telephone Encounter (Signed)
Called patient to inform her that per Dr. Birdie Riddle needs her to schedule an appt to follow up on her meds. Pt scheduled for 01/14/21 at 1pm v/v

## 2021-01-14 ENCOUNTER — Telehealth (INDEPENDENT_AMBULATORY_CARE_PROVIDER_SITE_OTHER): Payer: BC Managed Care – PPO | Admitting: Family Medicine

## 2021-01-14 ENCOUNTER — Encounter: Payer: Self-pay | Admitting: Family Medicine

## 2021-01-14 DIAGNOSIS — I10 Essential (primary) hypertension: Secondary | ICD-10-CM | POA: Diagnosis not present

## 2021-01-14 DIAGNOSIS — F329 Major depressive disorder, single episode, unspecified: Secondary | ICD-10-CM

## 2021-01-14 DIAGNOSIS — M25561 Pain in right knee: Secondary | ICD-10-CM

## 2021-01-14 MED ORDER — AMLODIPINE BESYLATE 5 MG PO TABS
5.0000 mg | ORAL_TABLET | Freq: Every day | ORAL | 1 refills | Status: DC
Start: 2021-01-14 — End: 2021-03-25

## 2021-01-14 MED ORDER — OLMESARTAN MEDOXOMIL-HCTZ 40-12.5 MG PO TABS
1.0000 | ORAL_TABLET | Freq: Every day | ORAL | 1 refills | Status: DC
Start: 2021-01-14 — End: 2021-07-22

## 2021-01-14 MED ORDER — LAMOTRIGINE 100 MG PO TABS: 100.0000 mg | ORAL_TABLET | Freq: Every day | ORAL | 1 refills | Status: DC

## 2021-01-14 NOTE — Progress Notes (Signed)
Virtual Visit via Video   I connected with patient on 01/14/21 at  1:00 PM EST by a video enabled telemedicine application and verified that I am speaking with the correct person using two identifiers.  Location patient: Home Location provider: [facility], Office Persons participating in the virtual visit: Patient, Provider, [place] (Sabrina M)  I discussed the limitations of evaluation and management by telemedicine and the availability of in person appointments. The patient expressed understanding and agreed to proceed.  Subjective:   HPI:   Major Depression- pt feels that things are going well on Lamictal 100mg  daily.  Feels mood is stable and good at this time.  Sleeping well w/ Zolpidem 5mg  daily  HTN- chronic problem, on Olmesartan HCTZ 40/12.5mg  daily and Amlodipine 5mg  daily.  No CP, SOB, HAs, visual changes, edema.  Due for refills.  R knee arthritis- following w/ Dr Wynelle Link and is getting the 3rd gel shot later this week.  ROS:   See pertinent positives and negatives per HPI.  Patient Active Problem List   Diagnosis Date Noted  . Lumbar pain 12/03/2020  . Pseudophakia 07/17/2020  . Advanced nonexudative age-related macular degeneration of right eye with subfoveal involvement 07/17/2020  . Intermediate stage nonexudative age-related macular degeneration of left eye 07/17/2020  . Fatigue 05/21/2020  . B12 deficiency 09/05/2019  . Obesity (BMI 30-39.9) 10/08/2018  . OAB (overactive bladder) 02/19/2017  . Right anterior knee pain 03/07/2016  . Lumbar radiculopathy 12/24/2015  . Solitary pulmonary nodule on lung CT 06/07/2014  . Trigger finger of right hand 12/26/2013  . General medical examination 07/15/2011  . PAIN IN JOINT, MULTIPLE SITES 09/20/2009  . Essential hypertension 10/02/2008  . GOUTY ARTHROPATHY 06/12/2008  . Chronic major depressive disorder 05/03/2007  . ALLERGIC RHINITIS 05/03/2007  . TOTAL HYSTERECTOMY AND BILATERAL SALPINGOOPHERECTOMY, HX  OF 05/03/2007    Social History   Tobacco Use  . Smoking status: Never Smoker  . Smokeless tobacco: Never Used  Substance Use Topics  . Alcohol use: Not Currently    Current Outpatient Medications:  .  amLODipine (NORVASC) 5 MG tablet, Take 1 tablet (5 mg total) by mouth daily. (Patient taking differently: Take 100 mg by mouth daily.), Disp: 90 tablet, Rfl: 1 .  Biotin 5 MG CAPS, Take by mouth daily., Disp: , Rfl:  .  Cholecalciferol (D3 ADULT PO), Take by mouth daily., Disp: , Rfl:  .  estrogen-methylTESTOSTERone (ESTRATEST) 1.25-2.5 MG per tablet, Take 1 tablet by mouth daily., Disp: , Rfl:  .  gabapentin (NEURONTIN) 300 MG capsule, gabapentin 300 mg capsule  Take 1 capsule as needed by oral route at bedtime for 30 days., Disp: , Rfl:  .  Loratadine (CLARITIN PO), Take by mouth daily., Disp: , Rfl:  .  olmesartan-hydrochlorothiazide (BENICAR HCT) 40-12.5 MG tablet, TAKE 1 TABLET BY MOUTH DAILY, Disp: 90 tablet, Rfl: 1 .  zolpidem (AMBIEN) 10 MG tablet, Take 0.5 tablets (5 mg total) by mouth at bedtime as needed. For sleep, Disp: 30 tablet, Rfl: 3 .  lamoTRIgine (LAMICTAL) 200 MG tablet, Take 1 tablet (200 mg total) by mouth daily. (Patient taking differently: Take 100 mg by mouth daily.), Disp: 30 tablet, Rfl: 3 .  predniSONE (DELTASONE) 5 MG tablet, prednisone 5 mg tablets in a dose pack  Take 1 dose pk by oral route as directed for 6 days. (Patient not taking: Reported on 01/14/2021), Disp: , Rfl:   No Known Allergies  Objective:   There were no vitals taken for this  visit. AAOx3, NAD NCAT, EOMI No obvious CN deficits Coloring WNL Pt is able to speak clearly, coherently without shortness of breath or increased work of breathing.  Thought process is linear.  Mood is appropriate.   Assessment and Plan:   Major Depressive Disorder- mood is stable and she is doing well on Lamictal 100mg  daily.  He is not interested in med changes at this time.  Refill provided  HTN- chronic  problem.  Pt is currently asymptomatic on Amlodipine 5mg  and Olmesartan HCTZ.  Refills sent on both meds.  Will continue to follow.  R knee arthritis- ongoing issue, following w/ Ortho.  Is having the 3rd of 3 gel shots which she is hoping will hold her for a few months.  Annye Asa, MD 01/14/2021

## 2021-01-14 NOTE — Progress Notes (Signed)
I connected with  Diana Vincent on 01/14/21 by a video enabled telemedicine application and verified that I am speaking with the correct person using two identifiers.   I discussed the limitations of evaluation and management by telemedicine. The patient expressed understanding and agreed to proceed.

## 2021-02-18 ENCOUNTER — Ambulatory Visit (INDEPENDENT_AMBULATORY_CARE_PROVIDER_SITE_OTHER): Payer: BC Managed Care – PPO | Admitting: Ophthalmology

## 2021-02-18 ENCOUNTER — Other Ambulatory Visit: Payer: Self-pay

## 2021-02-18 ENCOUNTER — Encounter (INDEPENDENT_AMBULATORY_CARE_PROVIDER_SITE_OTHER): Payer: Self-pay | Admitting: Ophthalmology

## 2021-02-18 DIAGNOSIS — H353122 Nonexudative age-related macular degeneration, left eye, intermediate dry stage: Secondary | ICD-10-CM

## 2021-02-18 DIAGNOSIS — H43823 Vitreomacular adhesion, bilateral: Secondary | ICD-10-CM | POA: Diagnosis not present

## 2021-02-18 DIAGNOSIS — H353113 Nonexudative age-related macular degeneration, right eye, advanced atrophic without subfoveal involvement: Secondary | ICD-10-CM

## 2021-02-18 DIAGNOSIS — H26492 Other secondary cataract, left eye: Secondary | ICD-10-CM

## 2021-02-18 DIAGNOSIS — H353123 Nonexudative age-related macular degeneration, left eye, advanced atrophic without subfoveal involvement: Secondary | ICD-10-CM

## 2021-02-18 NOTE — Assessment & Plan Note (Signed)
Noncentral involved atrophy, reviewed with the patient.

## 2021-02-18 NOTE — Assessment & Plan Note (Addendum)
Vitreomacular traction may cause vision loss from anatomic distortion to the center of the vision, the macula.  If visual function is symptomatic or threatened, therapy may be needed.  Surgical intervention offers the highest chance of visual stability and improvement.  Distortion of the macula anatomy may cause splitting of the retinal layers, termed foveomacular retinoschisis, which can cause more permanent vision loss.  Epiretinal membranes may also be associated.  Macular hole may also develop if vitreomacular traction progresses. The minor form of this condition is Vitreomacular adhesion, which is a natural change in the aging process of the eye, which requires observation only.  No visible distortion to the macula in either eye will continue to observe

## 2021-02-18 NOTE — Progress Notes (Signed)
02/18/2021     CHIEF COMPLAINT Patient presents for Retina Follow Up (5 Mo F/U OU//Pt c/o more difficulty reading with OD, especially recipes and small print books. Pt denies changes OS. No other new symptoms OU.)   HISTORY OF PRESENT ILLNESS: Diana Vincent is a 65 y.o. female who presents to the clinic today for:   HPI    Retina Follow Up    Patient presents with  Dry AMD.  In both eyes.  This started 5 months ago.  Severity is mild.  Duration of 5 months.  Since onset it is gradually worsening. Additional comments: 5 Mo F/U OU  Pt c/o more difficulty reading with OD, especially recipes and small print books. Pt denies changes OS. No other new symptoms OU.       Last edited by Rockie Neighbours, [place] on 02/18/2021 10:42 AM. (History)      Referring physician: Midge Minium, [address]  [place] [id number]  HISTORICAL INFORMATION:   Selected notes from the MEDICAL RECORD NUMBER    Lab Results  Component Value Date   HGBA1C 5.0 09/01/2012     CURRENT MEDICATIONS: No current outpatient medications on file. (Ophthalmic Drugs)   No current facility-administered medications for this visit. (Ophthalmic Drugs)   Current Outpatient Medications (Other)  Medication Sig  . amLODipine (NORVASC) 5 MG tablet Take 1 tablet (5 mg total) by mouth daily.  . Biotin 5 MG CAPS Take by mouth daily.  . Cholecalciferol (D3 ADULT PO) Take by mouth daily.  Marland Kitchen estrogen-methylTESTOSTERone (ESTRATEST) 1.25-2.5 MG per tablet Take 1 tablet by mouth daily.  Marland Kitchen gabapentin (NEURONTIN) 300 MG capsule gabapentin 300 mg capsule  Take 1 capsule as needed by oral route at bedtime for 30 days.  Marland Kitchen lamoTRIgine (LAMICTAL) 100 MG tablet Take 1 tablet (100 mg total) by mouth daily.  . Loratadine (CLARITIN PO) Take by mouth daily.  Marland Kitchen olmesartan-hydrochlorothiazide (BENICAR HCT) 40-12.5 MG tablet Take 1 tablet by mouth daily.  Marland Kitchen zolpidem (AMBIEN) 10 MG tablet Take 0.5 tablets (5 mg total) by mouth at  bedtime as needed. For sleep   No current facility-administered medications for this visit. (Other)      REVIEW OF SYSTEMS:    ALLERGIES No Known Allergies  PAST MEDICAL HISTORY Past Medical History:  Diagnosis Date  . Allergic rhinitis   . Anxiety   . Arthritis   . Cataract   . Depression   . Head injury    hairline fx from fall out of swing-   . Head injury without skull fracture    no LOC  . Hypertension    Past Surgical History:  Procedure Laterality Date  . ABDOMINAL HYSTERECTOMY  1990s  . ANTERIOR AND POSTERIOR REPAIR  07/07/2012   Procedure: ANTERIOR (CYSTOCELE) AND POSTERIOR REPAIR (RECTOCELE);  Surgeon: Daria Pastures, MD;  Location: [facility];  Service: Gynecology;  Laterality: N/A;  . BLADDER SUSPENSION  07/07/2012   Procedure: TRANSVAGINAL TAPE (TVT) PROCEDURE;  Surgeon: Daria Pastures, MD;  Location: [facility];  Service: Gynecology;  Laterality: N/A;  . CATARACT EXTRACTION Right    [place]  . CATARACT EXTRACTION Left    [place]  . CHOLECYSTECTOMY  2009  . COLONOSCOPY    . CYSTOSCOPY  07/07/2012   Procedure: CYSTOSCOPY;  Surgeon: Daria Pastures, MD;  Location: [facility];  Service: Gynecology;;  . POLYPECTOMY    . TOTAL HIP ARTHROPLASTY Right     FAMILY HISTORY Family History  Problem Relation Age of Onset  . Leukemia Mother   . Hypertension Mother   . Cancer Father        skin  . Hypertension Father   . Colon cancer Neg Hx   . Colon polyps Neg Hx   . Stomach cancer Neg Hx   . Esophageal cancer Neg Hx     SOCIAL HISTORY Social History   Tobacco Use  . Smoking status: Never Smoker  . Smokeless tobacco: Never Used  Substance Use Topics  . Alcohol use: Not Currently  . Drug use: No         OPHTHALMIC EXAM: Base Eye Exam    Visual Acuity (ETDRS)      Right Left   Dist cc CF @ 5' 20/20 -1   Dist ph cc NI    Correction: Glasses       Tonometry (Tonopen, 10:43 AM)      Right Left   Pressure 15 10       Pupils       Pupils Dark Light Shape React APD   Right PERRL 3 2 Round Brisk None   Left PERRL 3 2 Round Brisk None       Visual Fields (Counting fingers)      Left Right    Full Full       Extraocular Movement      Right Left    Full Full       Neuro/Psych    Oriented x3: Yes   Mood/Affect: Normal       Dilation    Both eyes: 1.0% Mydriacyl, 2.5% Phenylephrine @ 10:46 AM        Slit Lamp and Fundus Exam    External Exam      Right Left   External Normal Normal       Slit Lamp Exam      Right Left   Lids/Lashes Normal Normal   Conjunctiva/Sclera White and quiet White and quiet   Cornea Clear Clear   Anterior Chamber Deep and quiet Deep and quiet   Iris Round and reactive Round and reactive   Lens Posterior chamber intraocular lens, Centered posterior chamber intraocular lens, Open posterior capsule Posterior chamber intraocular lens, 1+ Posterior capsular opacification, Centered posterior chamber intraocular lens   Anterior Vitreous Normal Normal       Fundus Exam      Right Left   Posterior Vitreous Normal Normal   Disc Normal Normal   C/D Ratio 0.15 0.2   Macula Geographic atrophy in the foveal avascular zone, splits with small area of fixation light, Pigmented atrophy, in addition there is a reddish hue in this region which could suggest CNVM, no exudates, no hemorrhage, no macular thickening Geographic atrophy in a perifoveal location, not in the FAZ OS, Pigmented atrophy, no exudates, no macular thickening   Vessels Normal Normal   Periphery Normal Normal          IMAGING AND PROCEDURES  Imaging and Procedures for 02/18/21  OCT, Retina - OU - Both Eyes       Right Eye Quality was good. Scan locations included subfoveal. Central Foveal Thickness: 189. Progression has worsened. Findings include outer retinal atrophy, vitreomacular adhesion .   Left Eye Quality was good. Scan locations included subfoveal. Central Foveal Thickness: 248. Progression has been  stable. Findings include vitreomacular adhesion .   Notes Foveal RPE and choriocapillaris atrophy and loss of the photoreceptor layer, OD  No signs of active CNVM  OD.  OD with outer retinal atrophy, choriocapillaris and RPE dropout with loss of the photoreceptor layer.  No active CNVM.  OS with similar perifoveal RPE atrophy and chronic pigment epithelial detachment present over the last 1-1/2 years.  Change over time.  Will observe                  ASSESSMENT/PLAN:  Vitreomacular adhesion of both eyes Vitreomacular traction may cause vision loss from anatomic distortion to the center of the vision, the macula.  If visual function is symptomatic or threatened, therapy may be needed.  Surgical intervention offers the highest chance of visual stability and improvement.  Distortion of the macula anatomy may cause splitting of the retinal layers, termed foveomacular retinoschisis, which can cause more permanent vision loss.  Epiretinal membranes may also be associated.  Macular hole may also develop if vitreomacular traction progresses. The minor form of this condition is Vitreomacular adhesion, which is a natural change in the aging process of the eye, which requires observation only.  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  Posterior capsular opacification, left eye No likely impact at this time for mild PC opacity will continue observe      ICD-10-CM   1. Advanced nonexudative age-related macular degeneration of right eye without subfoveal involvement  H35.3113 OCT, Retina - OU - Both Eyes  2. Intermediate stage nonexudative age-related macular degeneration of left eye  H35.3122 OCT, Retina - OU - Both Eyes  3. Vitreomacular adhesion of both eyes  H43.823   4. Advanced nonexudative age-related  macular degeneration of left eye without subfoveal involvement  H35.3123   5. Posterior capsular opacification, left eye  H26.492     1.  2.  3.  Ophthalmic Meds Ordered this visit:  No orders of the defined types were placed in this encounter.      Return in about 6 months (around 08/21/2021) for DILATE OU, OCT, COLOR FP.  There are no Patient Instructions on file for this visit.   Explained the diagnoses, plan, and follow up with the patient and they expressed understanding.  Patient expressed understanding of the importance of proper follow up care.   Clent Demark Dequante Tremaine M.D. Diseases & Surgery of the Retina and Vitreous [facility] [place] 02/18/21     Abbreviations: M myopia (nearsighted); A astigmatism; H hyperopia (farsighted); P presbyopia; Mrx spectacle prescription;  CTL contact lenses; OD right eye; OS left eye; OU both eyes  XT exotropia; ET esotropia; PEK punctate epithelial keratitis; PEE punctate epithelial erosions; DES dry eye syndrome; MGD meibomian gland dysfunction; ATs artificial tears; PFAT's preservative free artificial tears; [place] nuclear sclerotic cataract; PSC posterior subcapsular cataract; ERM epi-retinal membrane; PVD posterior vitreous detachment; RD retinal detachment; DM diabetes mellitus; DR diabetic retinopathy; NPDR non-proliferative diabetic retinopathy; PDR proliferative diabetic retinopathy; CSME clinically significant macular edema; DME diabetic macular edema; dbh dot blot hemorrhages; CWS cotton wool spot; POAG primary open angle glaucoma; C/D cup-to-disc ratio; HVF humphrey visual field; GVF goldmann visual field; OCT optical coherence tomography; IOP intraocular pressure; BRVO Branch retinal vein occlusion; CRVO central retinal vein occlusion; CRAO central retinal artery occlusion; BRAO branch retinal artery occlusion; RT retinal tear; SB scleral buckle; PPV pars plana vitrectomy; VH Vitreous hemorrhage; PRP panretinal laser photocoagulation;  IVK intravitreal kenalog; VMT vitreomacular traction; MH Macular hole;  NVD neovascularization of  the disc; NVE neovascularization elsewhere; AREDS age related eye disease study; ARMD age related macular degeneration; POAG primary open angle glaucoma; EBMD epithelial/anterior basement membrane dystrophy; ACIOL anterior chamber intraocular lens; IOL intraocular lens; PCIOL posterior chamber intraocular lens; Phaco/IOL phacoemulsification with intraocular lens placement; [place] photorefractive keratectomy; LASIK laser assisted in situ keratomileusis; HTN hypertension; DM diabetes mellitus; COPD chronic obstructive pulmonary disease

## 2021-02-18 NOTE — Assessment & Plan Note (Signed)
No likely impact at this time for mild PC opacity will continue observe

## 2021-03-14 DIAGNOSIS — M25552 Pain in left hip: Secondary | ICD-10-CM | POA: Insufficient documentation

## 2021-03-25 ENCOUNTER — Encounter: Payer: Self-pay | Admitting: Family Medicine

## 2021-03-25 ENCOUNTER — Ambulatory Visit: Payer: BC Managed Care – PPO | Admitting: Family Medicine

## 2021-03-25 ENCOUNTER — Other Ambulatory Visit: Payer: Self-pay

## 2021-03-25 VITALS — BP 105/70 | Temp 98.0°F | Resp 19 | Ht 64.0 in | Wt 197.4 lb

## 2021-03-25 DIAGNOSIS — E669 Obesity, unspecified: Secondary | ICD-10-CM | POA: Diagnosis not present

## 2021-03-25 DIAGNOSIS — I10 Essential (primary) hypertension: Secondary | ICD-10-CM

## 2021-03-25 DIAGNOSIS — M25562 Pain in left knee: Secondary | ICD-10-CM

## 2021-03-25 LAB — TSH: TSH: 1.26 u[IU]/mL (ref 0.35–4.50)

## 2021-03-25 LAB — HEPATIC FUNCTION PANEL
ALT: 15 U/L (ref 0–35)
AST: 13 U/L (ref 0–37)
Albumin: 3.7 g/dL (ref 3.5–5.2)
Alkaline Phosphatase: 45 U/L (ref 39–117)
Bilirubin, Direct: 0.1 mg/dL (ref 0.0–0.3)
Total Bilirubin: 0.4 mg/dL (ref 0.2–1.2)
Total Protein: 6.1 g/dL (ref 6.0–8.3)

## 2021-03-25 LAB — LIPID PANEL
Cholesterol: 95 mg/dL (ref 0–200)
HDL: 52.6 mg/dL (ref >39.00)
LDL Cholesterol: 26 mg/dL (ref 0–99)
NonHDL: 42.11
Total CHOL/HDL Ratio: 2
Triglycerides: 83 mg/dL (ref 0.0–149.0)
VLDL: 16.6 mg/dL (ref 0.0–40.0)

## 2021-03-25 LAB — CBC WITH DIFFERENTIAL/PLATELET
Basophils Absolute: 0 10*3/uL (ref 0.0–0.1)
Basophils Relative: 0.2 % (ref 0.0–3.0)
Eosinophils Absolute: 0.2 10*3/uL (ref 0.0–0.7)
Eosinophils Relative: 2.5 % (ref 0.0–5.0)
HCT: 44.7 % (ref 36.0–46.0)
Hemoglobin: 15 g/dL (ref 12.0–15.0)
Lymphocytes Relative: 18.4 % (ref 12.0–46.0)
Lymphs Abs: 1.4 10*3/uL (ref 0.7–4.0)
MCHC: 33.5 g/dL (ref 30.0–36.0)
MCV: 94.7 fl (ref 78.0–100.0)
Monocytes Absolute: 0.7 10*3/uL (ref 0.1–1.0)
Monocytes Relative: 10 % (ref 3.0–12.0)
Neutro Abs: 5.1 10*3/uL (ref 1.4–7.7)
Neutrophils Relative %: 68.9 % (ref 43.0–77.0)
Platelets: 326 10*3/uL (ref 150.0–400.0)
RBC: 4.71 Mil/uL (ref 3.87–5.11)
RDW: 13.9 % (ref 11.5–15.5)
WBC: 7.5 10*3/uL (ref 4.0–10.5)

## 2021-03-25 LAB — BASIC METABOLIC PANEL
BUN: 12 mg/dL (ref 6–23)
CO2: 30 mEq/L (ref 19–32)
Calcium: 9.4 mg/dL (ref 8.4–10.5)
Chloride: 100 mEq/L (ref 96–112)
Creatinine, Ser: 0.71 mg/dL (ref 0.40–1.20)
GFR: 89.9 mL/min (ref >60.00)
Glucose, Bld: 73 mg/dL (ref 70–99)
Potassium: 3.5 mEq/L (ref 3.5–5.1)
Sodium: 137 mEq/L (ref 135–145)

## 2021-03-25 MED ORDER — AMLODIPINE BESYLATE 5 MG PO TABS: 5.0000 mg | ORAL_TABLET | Freq: Every day | ORAL | 1 refills | Status: DC

## 2021-03-25 MED ORDER — ZOLPIDEM TARTRATE 10 MG PO TABS: 10.0000 mg | ORAL_TABLET | Freq: Every evening | ORAL | 3 refills | Status: DC | PRN

## 2021-03-25 NOTE — Patient Instructions (Addendum)
Schedule your complete physical in 6 months We'll notify you of your lab results and make any changes if needed Continue to work on healthy diet and regular exercise- you can do it! Continue to ice your knee and tylenol as needed Call with any questions or concerns Stay Safe!  Stay Healthy! ENJOY [place]!!!

## 2021-03-25 NOTE — Progress Notes (Signed)
   Subjective:    Patient ID: Diana Vincent, female    DOB: [birth year], 65 y.o.   MRN: [id number]  HPI HTN- chronic problem, on Amlodipine 5mg  daily and Olmesartan HCTZ 40/12.5mg  daily w/ good control.  Denies CP, SOB, HAs, visual changes, edema  Obesity- pt has gained 11 lbs since last visit.  BMI is now 33.88.  Has been limited in exercise due to ongoing R knee pain.  Pt plans to resume walking as L knee pain allows.  Fall- pt missed bottom step at daughter's house on 3/23 and landed on L hip and knee.  Saw ortho who indicated no fracture or bony abnormality.  Continues to have pain of L knee.  Last night was first night she was able to sleep.   Review of Systems For ROS see HPI   This visit occurred during the SARS-CoV-2 public health emergency.  Safety protocols were in place, including screening questions prior to the visit, additional usage of staff PPE, and extensive cleaning of exam room while observing appropriate contact time as indicated for disinfecting solutions.       Objective:   Physical Exam Vitals reviewed.  Constitutional:      General: She is not in acute distress.    Appearance: She is well-developed. She is obese. She is not ill-appearing.  HENT:     Head: Normocephalic and atraumatic.  Eyes:     Conjunctiva/sclera: Conjunctivae normal.     Pupils: Pupils are equal, round, and reactive to light.  Neck:     Thyroid: No thyromegaly.  Cardiovascular:     Rate and Rhythm: Normal rate and regular rhythm.     Pulses: Normal pulses.     Heart sounds: Normal heart sounds. No murmur heard.   Pulmonary:     Effort: Pulmonary effort is normal. No respiratory distress.     Breath sounds: Normal breath sounds.  Abdominal:     General: There is no distension.     Palpations: Abdomen is soft.     Tenderness: There is no abdominal tenderness.  Musculoskeletal:     Cervical back: Normal range of motion and neck supple.     Right lower leg: No edema.     Left lower  leg: No edema.  Lymphadenopathy:     Cervical: No cervical adenopathy.  Skin:    General: Skin is warm and dry.  Neurological:     Mental Status: She is alert and oriented to person, place, and time.  Psychiatric:        Behavior: Behavior normal.           Assessment & Plan:  Acute knee pain- new.  L sided.  Occurred after fall on stairs.  Has already been evaluated by ortho but things are slow to improve.  Encouraged ice and tylenol as needed.  Pt expressed understanding and is in agreement w/ plan.

## 2021-03-25 NOTE — Assessment & Plan Note (Signed)
Chronic problem.  Excellent control on Amlodipine and Olmesartan HCTZ.  Check labs.  No anticipated med changes.  Will follow.

## 2021-03-25 NOTE — Assessment & Plan Note (Signed)
Deteriorated.  Pt has regained 11 lbs since last visit.  BMI is now 33.88  Her exercise has been limited due to her ongoing knee issues.  She plans to start walking more regularly now that weather is improving and knee is feeling better.  Will follow.

## 2021-06-12 ENCOUNTER — Encounter: Payer: Self-pay | Admitting: *Deleted

## 2021-07-05 DIAGNOSIS — M79671 Pain in right foot: Secondary | ICD-10-CM | POA: Insufficient documentation

## 2021-07-21 ENCOUNTER — Other Ambulatory Visit: Payer: Self-pay | Admitting: Family Medicine

## 2021-08-26 ENCOUNTER — Encounter (INDEPENDENT_AMBULATORY_CARE_PROVIDER_SITE_OTHER): Payer: BC Managed Care – PPO | Admitting: Ophthalmology

## 2021-09-09 ENCOUNTER — Ambulatory Visit (INDEPENDENT_AMBULATORY_CARE_PROVIDER_SITE_OTHER): Payer: BC Managed Care – PPO | Admitting: Ophthalmology

## 2021-09-09 ENCOUNTER — Other Ambulatory Visit: Payer: Self-pay

## 2021-09-09 ENCOUNTER — Encounter (INDEPENDENT_AMBULATORY_CARE_PROVIDER_SITE_OTHER): Payer: Self-pay | Admitting: Ophthalmology

## 2021-09-09 DIAGNOSIS — H353114 Nonexudative age-related macular degeneration, right eye, advanced atrophic with subfoveal involvement: Secondary | ICD-10-CM | POA: Diagnosis not present

## 2021-09-09 DIAGNOSIS — H353123 Nonexudative age-related macular degeneration, left eye, advanced atrophic without subfoveal involvement: Secondary | ICD-10-CM

## 2021-09-09 DIAGNOSIS — H353122 Nonexudative age-related macular degeneration, left eye, intermediate dry stage: Secondary | ICD-10-CM

## 2021-09-09 DIAGNOSIS — H353113 Nonexudative age-related macular degeneration, right eye, advanced atrophic without subfoveal involvement: Secondary | ICD-10-CM

## 2021-09-09 NOTE — Assessment & Plan Note (Signed)
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  I outlined we currently still not have approved therapy for treatment of geographic atrophy (dry AMD.  She understands that I have a personal and a professional interest in finding those patients who have may nightly hypoxic stress to the macula and to the CNS system due to undiagnosed or untreated sleep apnea.  Therefore a vascular with her moderately positive review of systems for sleep apnea to ask the PCP for home sleep study testing.  If in fact sleep apnea is discovered I do encourage all steps to be undertaken to treat this condition so as to maximize nightly oxygenation to the brain and also to the

## 2021-09-09 NOTE — Progress Notes (Signed)
09/09/2021     CHIEF COMPLAINT Patient presents for  Chief Complaint  Patient presents with   Retina Follow Up    5 Mo F/U OU  Pt c/o more difficulty reading with OD, especially recipes and small print books. Pt denies changes OS. No other new symptoms OU.      HISTORY OF PRESENT ILLNESS: Diana Vincent is a 65 y.o. female who presents to the clinic today for:   HPI     Retina Follow Up   Patient presents with  Dry AMD.  In both eyes.  This started 6 months ago.  Severity is mild.  Duration of 6 months.  Since onset it is gradually worsening. Additional comments: 5 Mo F/U OU  Pt c/o more difficulty reading with OD, especially recipes and small print books. Pt denies changes OS. No other new symptoms OU.        Comments   6 mos fu ou oct fp. Pt states "I have noticed some changes. My depth perception, if I am going to plug something in an outlet I have a hard time seeing it. Buttons on the microwave I have to have good light to be able to tell what I am doing." Pt states it has been going on for several months.  Denies new FOL or floaters.      Last edited by Laurin Coder on 09/09/2021 10:40 AM.      Referring physician: Midge Minium, MD [address] [place],  [place] [address]  HISTORICAL INFORMATION:   Selected notes from the MEDICAL RECORD NUMBER    Lab Results  Component Value Date   HGBA1C 5.0 09/01/2012     CURRENT MEDICATIONS: No current outpatient medications on file. (Ophthalmic Drugs)   No current facility-administered medications for this visit. (Ophthalmic Drugs)   Current Outpatient Medications (Other)  Medication Sig   amLODipine (NORVASC) 5 MG tablet Take 1 tablet (5 mg total) by mouth daily.   Biotin 5 MG CAPS Take by mouth daily.   Cholecalciferol (D3 ADULT PO) Take by mouth daily.   estrogen-methylTESTOSTERone (ESTRATEST) 1.25-2.5 MG per tablet Take 1 tablet by mouth daily.   gabapentin (NEURONTIN) 300 MG capsule gabapentin  300 mg capsule  Take 1 capsule as needed by oral route at bedtime for 30 days.   lamoTRIgine (LAMICTAL) 100 MG tablet TAKE 1 TABLET(100 MG) BY MOUTH DAILY   Loratadine (CLARITIN PO) Take by mouth daily.   olmesartan-hydrochlorothiazide (BENICAR HCT) 40-12.5 MG tablet TAKE 1 TABLET BY MOUTH DAILY   zolpidem (AMBIEN) 10 MG tablet Take 1 tablet (10 mg total) by mouth at bedtime as needed. For sleep   No current facility-administered medications for this visit. (Other)      REVIEW OF SYSTEMS:    ALLERGIES No Known Allergies  PAST MEDICAL HISTORY Past Medical History:  Diagnosis Date   Allergic rhinitis    Anxiety    Arthritis    Cataract    Depression    Head injury    hairline fx from fall out of swing-    Head injury without skull fracture    no LOC   Hypertension    Past Surgical History:  Procedure Laterality Date   ABDOMINAL HYSTERECTOMY  1990s   ANTERIOR AND POSTERIOR REPAIR  07/07/2012   Procedure: ANTERIOR (CYSTOCELE) AND POSTERIOR REPAIR (RECTOCELE);  Surgeon: Daria Pastures, MD;  Location: [facility];  Service: Gynecology;  Laterality: N/A;   BLADDER SUSPENSION  07/07/2012  Procedure: TRANSVAGINAL TAPE (TVT) PROCEDURE;  Surgeon: Daria Pastures, MD;  Location: [facility];  Service: Gynecology;  Laterality: N/A;   CATARACT EXTRACTION Right    [place]   CATARACT EXTRACTION Left    [place]   CHOLECYSTECTOMY  2009   COLONOSCOPY     CYSTOSCOPY  07/07/2012   Procedure: CYSTOSCOPY;  Surgeon: Daria Pastures, MD;  Location: [facility];  Service: Gynecology;;   POLYPECTOMY     TOTAL HIP ARTHROPLASTY Right     FAMILY HISTORY Family History  Problem Relation Age of Onset   Leukemia Mother    Hypertension Mother    Cancer Father        skin   Hypertension Father    Colon cancer Neg Hx    Colon polyps Neg Hx    Stomach cancer Neg Hx    Esophageal cancer Neg Hx     SOCIAL HISTORY Social History   Tobacco Use   Smoking status: Never   Smokeless  tobacco: Never  Substance Use Topics   Alcohol use: Not Currently   Drug use: No         OPHTHALMIC EXAM:  Base Eye Exam     Visual Acuity (ETDRS)       Right Left   Dist cc CF at 5' 20/25 -1    Correction: Glasses         Tonometry (Tonopen, 10:42 AM)       Right Left   Pressure 13 14         Pupils       Pupils Dark Light APD   Right PERRL 4 3 None   Left PERRL 4 3 None         Visual Fields (Counting fingers)       Left Right    Full Full         Extraocular Movement       Right Left    Full Full         Neuro/Psych     Oriented x3: Yes   Mood/Affect: Normal         Dilation     Both eyes: 1.0% Mydriacyl, 2.5% Phenylephrine @ 10:42 AM           Slit Lamp and Fundus Exam     External Exam       Right Left   External Normal Normal         Slit Lamp Exam       Right Left   Lids/Lashes Normal Normal   Conjunctiva/Sclera White and quiet White and quiet   Cornea Clear Clear   Anterior Chamber Deep and quiet Deep and quiet   Iris Round and reactive Round and reactive   Lens Posterior chamber intraocular lens, Centered posterior chamber intraocular lens, Open posterior capsule Posterior chamber intraocular lens, 1+ Posterior capsular opacification, Centered posterior chamber intraocular lens   Anterior Vitreous Normal Normal         Fundus Exam       Right Left   Posterior Vitreous Normal Normal   Disc Normal Normal   C/D Ratio 0.15 0.2   Macula Geographic atrophy in the foveal avascular zone, splits with small area of fixation light, Pigmented atrophy, in addition there is a reddish hue in this region which could suggest CNVM, no exudates, no hemorrhage, no macular thickening Geographic atrophy in a perifoveal location, not in the FAZ OS, Pigmented atrophy, no exudates, no macular thickening   Vessels  Normal Normal   Periphery Normal Normal            IMAGING AND PROCEDURES  Imaging and Procedures for  09/09/21  OCT, Retina - OU - Both Eyes       Right Eye Quality was good. Scan locations included subfoveal. Central Foveal Thickness: 189. Progression has worsened. Findings include outer retinal atrophy, vitreomacular adhesion .   Left Eye Quality was good. Scan locations included subfoveal. Central Foveal Thickness: 242. Progression has been stable. Findings include vitreomacular adhesion .   Notes Foveal RPE and choriocapillaris atrophy and loss of the photoreceptor layer, OD,, central atrophy  No signs of active CNVM OD.  OD with outer retinal atrophy, choriocapillaris and RPE dropout with loss of the photoreceptor layer.  No active CNVM.  OS with similar perifoveal RPE atrophy and chronic pigment epithelial detachment present over the last 1-1/2 years.  Change over time.  Will observe       Color Fundus Photography Optos - OU - Both Eyes       Right Eye Progression has no prior data. Disc findings include normal observations. Macula : flat, geographic atrophy. Vessels : normal observations. Periphery : normal observations.   Left Eye Progression has no prior data. Disc findings include normal observations. Macula : retinal pigment epithelium abnormalities, geographic atrophy, drusen, flat. Periphery : normal observations.   Notes OS with geographic patchy RPE atrophy, mostly perifoveal, no signs of active CNVM  OD with subfoveal geographic atrophy, no hemorrhage, no signs of active CNVM             ASSESSMENT/PLAN:  Advanced nonexudative age-related macular degeneration of right eye with subfoveal involvement OD, with subfoveal geographic atrophy stable over the last 2-1/2 years.  No change over time  Advanced nonexudative age-related macular degeneration of left eye without subfoveal involvement Careful review of the OCT and findings recording the geographic atrophy encroaching upon the center of the vision left eye reviewed with the patient and family.  I  outlined we currently still not have approved therapy for treatment of geographic atrophy (dry AMD.  She understands that I have a personal and a professional interest in finding those patients who have may nightly hypoxic stress to the macula and to the CNS system due to undiagnosed or untreated sleep apnea.  Therefore a vascular with her moderately positive review of systems for sleep apnea to ask the PCP for home sleep study testing.  If in fact sleep apnea is discovered I do encourage all steps to be undertaken to treat this condition so as to maximize nightly oxygenation to the brain and also to the     ICD-10-CM   1. Advanced nonexudative age-related macular degeneration of right eye without subfoveal involvement  H35.3113 OCT, Retina - OU - Both Eyes    Color Fundus Photography Optos - OU - Both Eyes    2. 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 Advanced nonexudative age-related macular degeneration of right eye with subfoveal involvement  H35.3114     4. Advanced nonexudative age-related macular degeneration of left eye without subfoveal involvement  H35.3123       1.  Bilateral dry ARMD.  Stable over time.  Excellent acuity OS preserved yet geographic atrophy is encroaching near the FAZ  2.  Slightly positive review of systems for sleep apnea.  I have encouraged the patient to seek all i evaluations to determine if sleep apnea might be present so as to minimize the risk of nightly hypoxic stress and damage to the macula as well as the remainder of the CNS system.  3.  If sleep apnea is discovered I do encourage the patient to take all measures to comply with therapy OS to minimize nightly hypoxia more importantly enjoy the benefits of a good sleep nightly sleep  Ophthalmic Meds Ordered this visit:  No orders of the defined types were placed in this  encounter.      Return in about 9 months (around 06/09/2022) for DILATE OU, COLOR FP, OCT.  There are no Patient Instructions on file for this visit.   Explained the diagnoses, plan, and follow up with the patient and they expressed understanding.  Patient expressed understanding of the importance of proper follow up care.   Clent Demark Hortensia Duffin M.D. Diseases & Surgery of the Retina and Vitreous Retina [facility] Diabetic [place] 09/09/21     Abbreviations: M myopia (nearsighted); A astigmatism; H hyperopia (farsighted); P presbyopia; Mrx spectacle prescription;  CTL contact lenses; OD right eye; OS left eye; OU both eyes  XT exotropia; ET esotropia; PEK punctate epithelial keratitis; PEE punctate epithelial erosions; DES dry eye syndrome; MGD meibomian gland dysfunction; ATs artificial tears; PFAT's preservative free artificial tears; [place] nuclear sclerotic cataract; PSC posterior subcapsular cataract; ERM epi-retinal membrane; PVD posterior vitreous detachment; RD retinal detachment; DM diabetes mellitus; DR diabetic retinopathy; NPDR non-proliferative diabetic retinopathy; PDR proliferative diabetic retinopathy; CSME clinically significant macular edema; DME diabetic macular edema; dbh dot blot hemorrhages; CWS cotton wool spot; POAG primary open angle glaucoma; C/D cup-to-disc ratio; HVF humphrey visual field; GVF goldmann visual field; OCT optical coherence tomography; IOP intraocular pressure; BRVO Branch retinal vein occlusion; CRVO central retinal vein occlusion; CRAO central retinal artery occlusion; BRAO branch retinal artery occlusion; RT retinal tear; SB scleral buckle; PPV pars plana vitrectomy; VH Vitreous hemorrhage; PRP panretinal laser photocoagulation; IVK intravitreal kenalog; VMT vitreomacular traction; MH Macular hole;  NVD neovascularization of the disc; NVE neovascularization elsewhere; AREDS age related eye disease study; ARMD age related macular degeneration; POAG primary open angle  glaucoma; EBMD epithelial/anterior basement membrane dystrophy; ACIOL anterior chamber intraocular lens; IOL intraocular lens; PCIOL posterior chamber intraocular lens; Phaco/IOL phacoemulsification with intraocular lens placement; [place] photorefractive keratectomy; LASIK laser assisted in situ keratomileusis; HTN hypertension; DM diabetes mellitus; COPD chronic obstructive pulmonary disease

## 2021-09-09 NOTE — Assessment & Plan Note (Signed)
OD, with subfoveal geographic atrophy stable over the last 2-1/2 years.  No change over time

## 2021-09-23 ENCOUNTER — Other Ambulatory Visit: Payer: Self-pay | Admitting: Family Medicine

## 2021-09-29 ENCOUNTER — Encounter (HOSPITAL_COMMUNITY): Payer: Self-pay | Admitting: Emergency Medicine

## 2021-09-29 ENCOUNTER — Other Ambulatory Visit: Payer: Self-pay

## 2021-09-29 ENCOUNTER — Emergency Department (HOSPITAL_COMMUNITY): Payer: BC Managed Care – PPO

## 2021-09-29 ENCOUNTER — Emergency Department (HOSPITAL_COMMUNITY)
Admission: EM | Admit: 2021-09-29 | Discharge: 2021-09-30 | Disposition: A | Discharge status: Home / Self Care | Payer: BC Managed Care – PPO | Attending: Emergency Medicine | Admitting: Emergency Medicine

## 2021-09-29 DIAGNOSIS — M546 Pain in thoracic spine: Secondary | ICD-10-CM

## 2021-09-29 DIAGNOSIS — R0789 Other chest pain: Secondary | ICD-10-CM | POA: Diagnosis not present

## 2021-09-29 DIAGNOSIS — I10 Essential (primary) hypertension: Secondary | ICD-10-CM | POA: Insufficient documentation

## 2021-09-29 DIAGNOSIS — R52 Pain, unspecified: Secondary | ICD-10-CM

## 2021-09-29 DIAGNOSIS — M545 Low back pain, unspecified: Secondary | ICD-10-CM | POA: Diagnosis not present

## 2021-09-29 DIAGNOSIS — W01198A Fall on same level from slipping, tripping and stumbling with subsequent striking against other object, initial encounter: Secondary | ICD-10-CM | POA: Insufficient documentation

## 2021-09-29 DIAGNOSIS — Y93E5 Activity, floor mopping and cleaning: Secondary | ICD-10-CM | POA: Diagnosis not present

## 2021-09-29 DIAGNOSIS — Z79899 Other long term (current) drug therapy: Secondary | ICD-10-CM | POA: Diagnosis not present

## 2021-09-29 DIAGNOSIS — Z96641 Presence of right artificial hip joint: Secondary | ICD-10-CM | POA: Insufficient documentation

## 2021-09-29 DIAGNOSIS — W19XXXA Unspecified fall, initial encounter: Secondary | ICD-10-CM

## 2021-09-29 DIAGNOSIS — R079 Chest pain, unspecified: Secondary | ICD-10-CM | POA: Diagnosis present

## 2021-09-29 MED ORDER — OXYCODONE-ACETAMINOPHEN 5-325 MG PO TABS
1.0000 | ORAL_TABLET | Freq: Once | ORAL | Status: AC
  Administered 2021-09-29: 1 via ORAL
  Filled 2021-09-29: qty 1

## 2021-09-29 NOTE — ED Provider Notes (Signed)
Emergency Medicine Provider Triage Evaluation Note  Diana Vincent , a 65 y.o. female  was evaluated in triage.  Pt complains of sudden onset, constant, sharp, substernal chest pain and mid back pain s/p mechanical fall that occurred earlier today. She tripped and fell landing directly against the bed frame on her chest. She then got her legs caught in another bed frame and caused her to twist and fall backwards landing onto her back. Feels like her back "bent in a wrong way." She currently complains of chest and mid back pain. Difficulty with sharp breaths. .  Review of Systems  Positive: + chest wall pain, back pain Negative: - head injury, LOC  Physical Exam  There were no vitals taken for this visit. Gen:   Awake, Uncomfortable.  Resp:  Normal effort  MSK:   Moves extremities without difficulty  Other:  + substernal chest wall TTP. + midline T and L spine TTP.   Medical Decision Making  Medically screening exam initiated at 6:53 PM.  Appropriate orders placed.  Kassandra Meriweather was informed that the remainder of the evaluation will be completed by another provider, this initial triage assessment does not replace that evaluation, and the importance of remaining in the ED until their evaluation is complete.     Eustaquio Maize, PA-C 09/29/21 1854    Davonna Belling, MD 09/29/21 2025

## 2021-09-29 NOTE — ED Triage Notes (Signed)
Pt to triage via [facility] from home.  Reports she slipped and fell just prior to arrival and she hit her chest on a metal bed rail.  States she also hit lower legs on bed rail.  Reports severe pain to thoracic back, SOB, and chest pain.

## 2021-09-30 MED ORDER — METHOCARBAMOL 500 MG PO TABS: 500.0000 mg | ORAL_TABLET | Freq: Two times a day (BID) | ORAL | 0 refills | Status: DC

## 2021-09-30 MED ORDER — OXYCODONE HCL 5 MG PO TABS
10.0000 mg | ORAL_TABLET | Freq: Once | ORAL | Status: AC
Start: 2021-09-30 — End: 2021-09-30
  Administered 2021-09-30: 10 mg via ORAL
  Filled 2021-09-30: qty 2

## 2021-09-30 MED ORDER — OXYCODONE HCL 5 MG PO TABS: 5.0000 mg | ORAL_TABLET | Freq: Four times a day (QID) | ORAL | 0 refills | Status: DC | PRN

## 2021-09-30 MED ORDER — KETOROLAC TROMETHAMINE 60 MG/2ML IM SOLN
60.0000 mg | Freq: Once | INTRAMUSCULAR | Status: AC
  Administered 2021-09-30: 60 mg via INTRAMUSCULAR
  Filled 2021-09-30: qty 2

## 2021-09-30 MED ORDER — METHOCARBAMOL 500 MG PO TABS
500.0000 mg | ORAL_TABLET | Freq: Once | ORAL | Status: AC
  Administered 2021-09-30: 500 mg via ORAL
  Filled 2021-09-30: qty 1

## 2021-09-30 NOTE — ED Provider Notes (Signed)
[facility] EMERGENCY DEPARTMENT Provider Note   CSN: [id number] Arrival date & time: 09/29/21  1836     History Chief Complaint  Patient presents with   Fall   Back Pain   Chest Pain    Diana Vincent is a 65 y.o. female presents to the ED for chest and back pain after mechanical fall this afternoon. She reports she had taken the mattress and box springs off her bed to clean.  She stumbled on the mattress and fell onto the bed frame striking her chest and shins on the crossbar.  When this happened patient reports her back bent backwards and she heard a pop.  Reports afterwards she could feel and move her legs but was very concerned about a possible fracture.  No bowel or bladder incontinence.  Patient has been able to move her legs and walk but this does elicit pain.  Movement and palpation makes her symptoms worse.  Nothing seems to make them better.  No previous surgeries on her back.  The history is provided by the patient, the spouse and medical records. No language interpreter was used.      Past Medical History:  Diagnosis Date   Allergic rhinitis    Anxiety    Arthritis    Cataract    Depression    Head injury    hairline fx from fall out of swing-    Head injury without skull fracture    no LOC   Hypertension     Patient Active Problem List   Diagnosis Date Noted   Pain of left hip joint 03/14/2021   Vitreomacular adhesion of both eyes 02/18/2021   Posterior capsular opacification, left eye 02/18/2021   Lumbar pain 12/03/2020   Pseudophakia 07/17/2020   Advanced nonexudative age-related macular degeneration of right eye with subfoveal involvement 07/17/2020   Advanced nonexudative age-related macular degeneration of left eye without subfoveal involvement 07/17/2020   Fatigue 05/21/2020   B12 deficiency 09/05/2019   Obesity (BMI 30-39.9) 10/08/2018   OAB (overactive bladder) 02/19/2017   Right anterior knee pain 03/07/2016   Lumbar  radiculopathy 12/24/2015   Solitary pulmonary nodule on lung CT 06/07/2014   Trigger finger of right hand 12/26/2013   General medical examination 07/15/2011   PAIN IN JOINT, MULTIPLE SITES 09/20/2009   Essential hypertension 10/02/2008   GOUTY ARTHROPATHY 06/12/2008   Chronic major depressive disorder 05/03/2007   ALLERGIC RHINITIS 05/03/2007   TOTAL HYSTERECTOMY AND BILATERAL SALPINGOOPHERECTOMY, HX OF 05/03/2007    Past Surgical History:  Procedure Laterality Date   ABDOMINAL HYSTERECTOMY  1990s   ANTERIOR AND POSTERIOR REPAIR  07/07/2012   Procedure: ANTERIOR (CYSTOCELE) AND POSTERIOR REPAIR (RECTOCELE);  Surgeon: Daria Pastures, MD;  Location: [facility];  Service: Gynecology;  Laterality: N/A;   BLADDER SUSPENSION  07/07/2012   Procedure: TRANSVAGINAL TAPE (TVT) PROCEDURE;  Surgeon: Daria Pastures, MD;  Location: [facility];  Service: Gynecology;  Laterality: N/A;   CATARACT EXTRACTION Right    [place]   CATARACT EXTRACTION Left    [place]   CHOLECYSTECTOMY  2009   COLONOSCOPY     CYSTOSCOPY  07/07/2012   Procedure: CYSTOSCOPY;  Surgeon: Daria Pastures, MD;  Location: [facility];  Service: Gynecology;;   POLYPECTOMY     TOTAL HIP ARTHROPLASTY Right      OB History   No obstetric history on file.     Family History  Problem Relation Age of Onset   Leukemia Mother  Hypertension Mother    Cancer Father        skin   Hypertension Father    Colon cancer Neg Hx    Colon polyps Neg Hx    Stomach cancer Neg Hx    Esophageal cancer Neg Hx     Social History   Tobacco Use   Smoking status: Never   Smokeless tobacco: Never  Substance Use Topics   Alcohol use: Not Currently   Drug use: No    Home Medications Prior to Admission medications   Medication Sig Start Date End Date Taking? Authorizing Provider  methocarbamol (ROBAXIN) 500 MG tablet Take 1 tablet (500 mg total) by mouth 2 (two) times daily. 09/30/21  Yes Nera Haworth, Jarrett Soho, PA-C  oxyCODONE  (ROXICODONE) 5 MG immediate release tablet Take 1 tablet (5 mg total) by mouth every 6 (six) hours as needed for severe pain. 09/30/21  Yes Jeriah Corkum, Jarrett Soho, PA-C  amLODipine (NORVASC) 5 MG tablet TAKE 1 TABLET(5 MG) BY MOUTH DAILY 09/24/21   Midge Minium, MD  Biotin 5 MG CAPS Take by mouth daily.    [provider]  Cholecalciferol (D3 ADULT PO) Take by mouth daily.    [provider]  estrogen-methylTESTOSTERone (ESTRATEST) 1.25-2.5 MG per tablet Take 1 tablet by mouth daily.    [provider]  gabapentin (NEURONTIN) 300 MG capsule gabapentin 300 mg capsule  Take 1 capsule as needed by oral route at bedtime for 30 days.    [provider]  lamoTRIgine (LAMICTAL) 100 MG tablet TAKE 1 TABLET(100 MG) BY MOUTH DAILY 07/22/21   Midge Minium, MD  Loratadine (CLARITIN PO) Take by mouth daily.    [provider]  olmesartan-hydrochlorothiazide (BENICAR HCT) 40-12.5 MG tablet TAKE 1 TABLET BY MOUTH DAILY 07/22/21   Midge Minium, MD  zolpidem (AMBIEN) 10 MG tablet Take 1 tablet (10 mg total) by mouth at bedtime as needed. For sleep 03/25/21   Midge Minium, MD    Allergies    Patient has no known allergies.  Review of Systems   Review of Systems  Constitutional:  Negative for appetite change, diaphoresis, fatigue, fever and unexpected weight change.  HENT:  Negative for mouth sores.   Eyes:  Negative for visual disturbance.  Respiratory:  Negative for cough, chest tightness, shortness of breath and wheezing.   Cardiovascular:  Positive for chest pain.  Gastrointestinal:  Negative for abdominal pain, constipation, diarrhea, nausea and vomiting.  Endocrine: Negative for polydipsia, polyphagia and polyuria.  Genitourinary:  Negative for dysuria, frequency, hematuria and urgency.  Musculoskeletal:  Positive for back pain. Negative for neck stiffness.  Skin:  Negative for rash.  Allergic/Immunologic: Negative for immunocompromised  state.  Neurological:  Negative for syncope, light-headedness and headaches.  Hematological:  Does not bruise/bleed easily.  Psychiatric/Behavioral:  Negative for sleep disturbance. The patient is not nervous/anxious.    Physical Exam Updated Vital Signs BP 140/76   Pulse 72   Temp 98.6 F (37 C)   Resp 17   SpO2 100%   Physical Exam Vitals and nursing note reviewed.  Constitutional:      General: She is not in acute distress.    Appearance: She is well-developed. She is not ill-appearing.  HENT:     Head: Normocephalic.  Eyes:     General: No scleral icterus.    Conjunctiva/sclera: Conjunctivae normal.  Neck:     Comments: Full ROM No midline or paraspinal tenderness. No step-off or deformity.  Cardiovascular:  Rate and Rhythm: Normal rate.  Pulmonary:     Effort: Pulmonary effort is normal.  Chest:     Chest wall: Tenderness present.    Abdominal:     General: There is no distension.  Musculoskeletal:     Cervical back: Normal and normal range of motion.     Thoracic back: Tenderness and bony tenderness present. Decreased range of motion.     Lumbar back: Tenderness and bony tenderness present. Decreased range of motion.     Comments: TTP along the midline and paraspinal muscles of the Mid T-spine through the lower L-spine. No step-off or deformity.   Skin:    General: Skin is warm and dry.  Neurological:     Mental Status: She is alert.  Psychiatric:        Mood and Affect: Mood normal.    ED Results / Procedures / Treatments   Labs (all labs ordered are listed, but only abnormal results are displayed) Labs Reviewed - No data to display  EKG EKG Interpretation  Date/Time:  Sunday September 29 2021 19:04:46 EDT Ventricular Rate:  99 PR Interval:  132 QRS Duration: 78 QT Interval:  356 QTC Calculation: 456 R Axis:   -9 Text Interpretation: Normal sinus rhythm Normal ECG No significant change since last tracing Confirmed by Addison Lank [phone]) on  09/29/2021 11:04:22 PM  Radiology DG Chest 2 View  Result Date: 09/29/2021 CLINICAL DATA:  Chest pain post fall EXAM: CHEST - 2 VIEW COMPARISON:  2013 FINDINGS: Low lung volumes. No pleural effusion or pneumothorax. Lungs are clear. Cholecystectomy clips. No acute osseous abnormality. IMPRESSION: No acute process in the chest. Electronically Signed   By: Macy Mis M.D.   On: 09/29/2021 19:51   DG Thoracic Spine 2 View  Result Date: 09/29/2021 CLINICAL DATA:  Fall, pain EXAM: THORACIC SPINE 2 VIEWS COMPARISON:  None. FINDINGS: No fracture or focal bone lesion. Degenerative spurring anteriorly throughout the thoracic spine. Normal alignment. IMPRESSION: No acute bony abnormality. Electronically Signed   By: Rolm Baptise M.D.   On: 09/29/2021 19:47   DG Lumbar Spine Complete  Result Date: 09/29/2021 CLINICAL DATA:  Fall, pain EXAM: LUMBAR SPINE - COMPLETE 4+ VIEW COMPARISON:  None. FINDINGS: Normal alignment. No fracture or focal bone lesion. Diffuse degenerative disc and facet disease. SI joints symmetric and unremarkable. IMPRESSION: Degenerative changes.  No acute bony abnormality. Electronically Signed   By: Rolm Baptise M.D.   On: 09/29/2021 19:46    Procedures Procedures   Medications Ordered in ED Medications  methocarbamol (ROBAXIN) tablet 500 mg (has no administration in time range)  ketorolac (TORADOL) injection 60 mg (has no administration in time range)  oxyCODONE (Oxy IR/ROXICODONE) immediate release tablet 10 mg (has no administration in time range)  oxyCODONE-acetaminophen (PERCOCET/ROXICET) 5-325 MG per tablet 1 tablet (1 tablet Oral Given 09/29/21 1857)    ED Course  I have reviewed the triage vital signs and the nursing notes.  Pertinent labs & imaging results that were available during my care of the patient were reviewed by me and considered in my medical decision making (see chart for details).    MDM Rules/Calculators/A&P                            Patient presents with chest and back pain after mechanical fall.  She did not hit her head or have a loss of consciousness.  Anterior chest tenderness and midline and paraspinal tenderness  the T and L-spine.  Plain films are without acute abnormality.  EKG without ischemia.  Pain initially improved with Percocet in triage however this has worn off.  Additional pain medication given at discharge.  Patient does have an orthopedist for which she will follow.  Discussed reasons to return immediately to the emergency department.  States understanding and is in agreement with the plan.   Final Clinical Impression(s) / ED Diagnoses Final diagnoses:  Chest wall pain  Acute midline thoracic back pain  Lumbar back pain  Fall, initial encounter    Rx / DC Orders ED Discharge Orders          Ordered    oxyCODONE (ROXICODONE) 5 MG immediate release tablet  Every 6 hours PRN        09/30/21 0445    methocarbamol (ROBAXIN) 500 MG tablet  2 times daily        09/30/21 0445             Zykeem Bauserman, Jarrett Soho, PA-C 09/30/21 0502    Maudie Flakes, MD 09/30/21 [phone]

## 2021-09-30 NOTE — ED Notes (Signed)
Pt pulled back into triage for reassessment. Jarrett Soho PA at bedside.

## 2021-09-30 NOTE — Discharge Instructions (Addendum)
  1. Medications: robaxin - muscle relaxer, ibuprofen 800mg  3x per day with food, acetaminophen - 1000mg  4x per day - do not exceed 4g in any 24 hours period; oxycodone for breakthrough pain, usual home medications 2. Treatment: rest, drink plenty of fluids, gentle stretching as discussed, alternate ice and heat; miralax for constipation prevention, home prescription of voltaren 3. Follow Up: Please followup with your primary doctor in 3 days for discussion of your diagnoses and further evaluation after today's visit; if you do not have a primary care doctor use the resource guide provided to find one;  Return to the ER for worsening back pain, difficulty walking, loss of bowel or bladder control or other concerning symptoms

## 2021-10-04 ENCOUNTER — Other Ambulatory Visit: Payer: Self-pay

## 2021-10-04 NOTE — Telephone Encounter (Signed)
Requesting:Ambien 10mg  Contract: UDS: Last Visit:03/25/21 Next Visit:n/a Last Refill:03/25/21 30 tabs 3 refiils  Please Advise

## 2021-10-04 NOTE — Telephone Encounter (Signed)
Caller name:Diana Vincent   Caller callback [phone]  Encourage patient to contact the pharmacy for refills or they can request refills through [facility]  (Please schedule appointment if patient has not been seen in over a year)  MEDICATION NAME & DOSE:zolpidem (AMBIEN) 10 MG tablet   Notes/Comments from patient:[facility] said they faxed this over beginning of the week and I do not see nothing on file pt is calling checking on refill   WHAT [place] TO: [facility] [place], [address] AT [address]   Please notify patient: It takes 48-72 hours to process rx refill requests Ask patient to call pharmacy to ensure rx is ready before heading there.   (CLINICAL TO FILL OR ROUTE PER PROTOCOLS)

## 2021-10-07 MED ORDER — ZOLPIDEM TARTRATE 10 MG PO TABS: 10.0000 mg | ORAL_TABLET | Freq: Every evening | ORAL | 3 refills | Status: DC | PRN

## 2021-10-18 ENCOUNTER — Other Ambulatory Visit: Payer: Self-pay | Admitting: Family Medicine

## 2021-11-15 DIAGNOSIS — N951 Menopausal and female climacteric states: Secondary | ICD-10-CM | POA: Insufficient documentation

## 2021-12-20 ENCOUNTER — Encounter: Payer: BC Managed Care – PPO | Admitting: Family Medicine

## 2021-12-20 ENCOUNTER — Ambulatory Visit (INDEPENDENT_AMBULATORY_CARE_PROVIDER_SITE_OTHER): Payer: Medicare PPO | Admitting: Family Medicine

## 2021-12-20 ENCOUNTER — Encounter: Payer: Self-pay | Admitting: Family Medicine

## 2021-12-20 VITALS — BP 118/80 | HR 88 | Temp 98.0°F | Resp 16 | Ht 64.0 in | Wt 210.8 lb

## 2021-12-20 DIAGNOSIS — L600 Ingrowing nail: Secondary | ICD-10-CM

## 2021-12-20 DIAGNOSIS — F329 Major depressive disorder, single episode, unspecified: Secondary | ICD-10-CM

## 2021-12-20 DIAGNOSIS — E669 Obesity, unspecified: Secondary | ICD-10-CM | POA: Diagnosis not present

## 2021-12-20 DIAGNOSIS — I10 Essential (primary) hypertension: Secondary | ICD-10-CM | POA: Diagnosis not present

## 2021-12-20 DIAGNOSIS — Z23 Encounter for immunization: Secondary | ICD-10-CM

## 2021-12-20 DIAGNOSIS — Z0000 Encounter for general adult medical examination without abnormal findings: Secondary | ICD-10-CM

## 2021-12-20 MED ORDER — LAMOTRIGINE 150 MG PO TABS: 150.0000 mg | ORAL_TABLET | Freq: Every day | ORAL | 1 refills | Status: DC

## 2021-12-20 MED ORDER — ZOLPIDEM TARTRATE 10 MG PO TABS: 10.0000 mg | ORAL_TABLET | Freq: Every evening | ORAL | 3 refills | Status: DC | PRN

## 2021-12-20 NOTE — Assessment & Plan Note (Signed)
Deteriorated.  Pt has gained 14 lbs since last visit.  BMI now 36.18  Encouraged healthy diet and regular exercise.  Will follow.

## 2021-12-20 NOTE — Assessment & Plan Note (Signed)
Pt's PE WNL w/ exception of obesity.  UTD on pap, mammo, colonoscopy, Tdap, flu.  Will get Prevnar 20 today.  Check labs.  Anticipatory guidance provided regarding healthy diet, regular exercise, stress management.

## 2021-12-20 NOTE — Patient Instructions (Addendum)
Go to [place] and get your labs done Follow up in 6 months to recheck BP and weight loss progress We'll notify you of your lab results and make any changes if needed Continue to work on healthy diet and regular exercise- you can do it! In the midst of everything, make sure you are taking care of yourself!!! Call with any questions or concerns Stay Safe!  Stay Healthy!! Happy New Year!!!

## 2021-12-20 NOTE — Assessment & Plan Note (Signed)
Deteriorated.  Pt's husband dx'd w/ stage 4 prostate cancer.  She is having a hard time dealing w/ 'my new life'.  Would like to increase the Lamictal to 150mg - prescription sent.  Will also place referral for counselor to help her navigate this journey.  Will follow.

## 2021-12-20 NOTE — Addendum Note (Signed)
Addended by: Juliann Pulse on: 12/20/2021 11:56 AM   Modules accepted: Orders

## 2021-12-20 NOTE — Progress Notes (Signed)
Subjective:    Patient ID: Diana Vincent, female    DOB: [birth year], 66 y.o.   MRN: [id number]  HPI Here today for Welcome to Medicare CPE.  Risk Factors: HTN- chronic problem, on Amlodipine 5 mg daily and Olmesartan HCTZ 40/12.38m w/ good control Macular degeneration- following w/ Dr RZadie RhineObesity- pt has gained 14 lbs since last visit.  BMI now 36.18 Physical Activity: no regular exercise Fall Risk: pt w/ moderate risk, has had hx of falls Depression: ongoing issue for pt.  PHQ=9 today.  Currently on Lamictal.  Husband has been dx'd w/ stage 4 prostate cancer Hearing: normal to conversational tones at 6 ft ADL's: independent Cognitive: normal linear thought process, memory and attention intact Home Safety: safe at home Height, Weight, BMI, Visual Acuity: see vitals, vision corrected to 20/20 w/ glasses Counseling: UTD on pap, mammo, colonoscopy, Tdap, flu shot.  Due for pneumonia vaccine Health Care POA/Living Will: pt reports they have researched and met w/ lawyer regarding this Pt is on Ambien but no other controlled substances, does not smoke or drink, at low risk of substance abuse issues Labs Ordered: See A&P Care Plan: See A&P   Patient Care Team    Relationship Specialty Notifications Start End  TMidge Minium MD PCP - General Family Medicine  04/02/16   HBobbye Charleston MD Consulting Physician Obstetrics and Gynecology  06/10/17   PJerene Bears MD Consulting Physician Gastroenterology  09/26/20      Health Maintenance  Topic Date Due   HIV Screening  Never done   Hepatitis C Screening  Never done   Zoster Vaccines- Shingrix (1 of 2) Never done   COVID-19 Vaccine (4 - Booster for Pfizer series) 01/09/2021   Pneumonia Vaccine 66 Years old (1 - PCV) Never done   MAMMOGRAM  11/01/2022   COLONOSCOPY (Pts 45-450yrInsurance coverage will need to be confirmed)  05/24/2023   TETANUS/TDAP  11/02/2027   INFLUENZA VACCINE  Completed   DEXA SCAN  Completed   HPV  VACCINES  Aged Out      Review of Systems Patient reports no vision/ hearing changes, adenopathy,fever, weight change,  persistant/recurrent hoarseness , swallowing issues, chest pain, palpitations, edema, persistant/recurrent cough, hemoptysis, dyspnea (rest/exertional/paroxysmal nocturnal), gastrointestinal bleeding (melena, rectal bleeding), abdominal pain, significant heartburn, bowel changes, Gyn symptoms (abnormal  bleeding, pain),  syncope, focal weakness, memory loss, numbness & tingling, skin/hair/nail changes, abnormal bruising or bleeding.  + bladder incontinence  This visit occurred during the SARS-CoV-2 public health emergency.  Safety protocols were in place, including screening questions prior to the visit, additional usage of staff PPE, and extensive cleaning of exam room while observing appropriate contact time as indicated for disinfecting solutions.      Objective:   Physical Exam General Appearance:    Alert, cooperative, no distress, appears stated age  Head:    Normocephalic, without obvious abnormality, atraumatic  Eyes:    PERRL, conjunctiva/corneas clear, EOM's intact, fundi    benign, both eyes  Ears:    Normal TM's and external ear canals, both ears  Nose:   Deferred due to COVID  Throat:   Neck:   Supple, symmetrical, trachea midline, no adenopathy;    Thyroid: no enlargement/tenderness/nodules  Back:     Symmetric, no curvature, ROM normal, no CVA tenderness  Lungs:     Clear to auscultation bilaterally, respirations unlabored  Chest Wall:    No tenderness or deformity   Heart:    Regular rate and rhythm, S1  and S2 normal, no murmur, rub   or gallop  Breast Exam:    Deferred to GYN  Abdomen:     Soft, non-tender, bowel sounds active all four quadrants,    no masses, no organomegaly  Genitalia:    Deferred to GYN  Rectal:    Extremities:   Extremities normal, atraumatic, no cyanosis or edema  Pulses:   2+ and symmetric all extremities  Skin:   Skin color,  texture, turgor normal, no rashes or lesions  Lymph nodes:   Cervical, supraclavicular, and axillary nodes normal  Neurologic:   CNII-XII intact, normal strength, sensation and reflexes    throughout          Assessment & Plan:

## 2021-12-22 ENCOUNTER — Other Ambulatory Visit: Payer: Self-pay | Admitting: Family Medicine

## 2021-12-24 ENCOUNTER — Other Ambulatory Visit (INDEPENDENT_AMBULATORY_CARE_PROVIDER_SITE_OTHER): Payer: Medicare PPO

## 2021-12-24 DIAGNOSIS — I10 Essential (primary) hypertension: Secondary | ICD-10-CM

## 2021-12-24 DIAGNOSIS — E669 Obesity, unspecified: Secondary | ICD-10-CM

## 2021-12-24 LAB — CBC WITH DIFFERENTIAL/PLATELET
Basophils Absolute: 0 10*3/uL (ref 0.0–0.1)
Basophils Relative: 0.5 % (ref 0.0–3.0)
Eosinophils Absolute: 0.2 10*3/uL (ref 0.0–0.7)
Eosinophils Relative: 2.7 % (ref 0.0–5.0)
HCT: 41.7 % (ref 36.0–46.0)
Hemoglobin: 14.1 g/dL (ref 12.0–15.0)
Lymphocytes Relative: 18.6 % (ref 12.0–46.0)
Lymphs Abs: 1.2 10*3/uL (ref 0.7–4.0)
MCHC: 33.8 g/dL (ref 30.0–36.0)
MCV: 93.8 fl (ref 78.0–100.0)
Monocytes Absolute: 0.7 10*3/uL (ref 0.1–1.0)
Monocytes Relative: 11.3 % (ref 3.0–12.0)
Neutro Abs: 4.3 10*3/uL (ref 1.4–7.7)
Neutrophils Relative %: 66.9 % (ref 43.0–77.0)
Platelets: 348 10*3/uL (ref 150.0–400.0)
RBC: 4.44 Mil/uL (ref 3.87–5.11)
RDW: 13.4 % (ref 11.5–15.5)
WBC: 6.5 10*3/uL (ref 4.0–10.5)

## 2021-12-24 LAB — LIPID PANEL
Cholesterol: 127 mg/dL (ref 0–200)
HDL: 64 mg/dL (ref >39.00)
LDL Cholesterol: 41 mg/dL (ref 0–99)
NonHDL: 62.52
Total CHOL/HDL Ratio: 2
Triglycerides: 109 mg/dL (ref 0.0–149.0)
VLDL: 21.8 mg/dL (ref 0.0–40.0)

## 2021-12-24 LAB — BASIC METABOLIC PANEL
BUN: 15 mg/dL (ref 6–23)
CO2: 29 mEq/L (ref 19–32)
Calcium: 9.9 mg/dL (ref 8.4–10.5)
Chloride: 104 mEq/L (ref 96–112)
Creatinine, Ser: 0.7 mg/dL (ref 0.40–1.20)
GFR: 90.97 mL/min (ref >60.00)
Glucose, Bld: 83 mg/dL (ref 70–99)
Potassium: 4 mEq/L (ref 3.5–5.1)
Sodium: 140 mEq/L (ref 135–145)

## 2021-12-24 LAB — TSH: TSH: 2.78 u[IU]/mL (ref 0.35–5.50)

## 2021-12-24 LAB — HEPATIC FUNCTION PANEL
ALT: 15 U/L (ref 0–35)
AST: 15 U/L (ref 0–37)
Albumin: 3.9 g/dL (ref 3.5–5.2)
Alkaline Phosphatase: 74 U/L (ref 39–117)
Bilirubin, Direct: 0.1 mg/dL (ref 0.0–0.3)
Total Bilirubin: 0.4 mg/dL (ref 0.2–1.2)
Total Protein: 6.4 g/dL (ref 6.0–8.3)

## 2021-12-25 ENCOUNTER — Encounter: Payer: Self-pay | Admitting: Podiatry

## 2021-12-25 ENCOUNTER — Ambulatory Visit: Payer: Medicare PPO | Admitting: Podiatry

## 2021-12-25 ENCOUNTER — Other Ambulatory Visit: Payer: Self-pay

## 2021-12-25 DIAGNOSIS — M2041 Other hammer toe(s) (acquired), right foot: Secondary | ICD-10-CM

## 2021-12-25 DIAGNOSIS — M778 Other enthesopathies, not elsewhere classified: Secondary | ICD-10-CM | POA: Diagnosis not present

## 2021-12-25 DIAGNOSIS — L600 Ingrowing nail: Secondary | ICD-10-CM

## 2021-12-25 NOTE — Progress Notes (Signed)
Subjective:   Patient ID: Diana Vincent, female   DOB: 66 y.o.   MRN: [id number]   HPI Patient presents with pain on the top of both feet that she saw a doctor who did not do anything and a chronic ingrown toenail the right big toe that is very sore and she is tried to trim and soak herself.  Patient does not smoke likes to be active   Review of Systems  All other systems reviewed and are negative.      Objective:  Physical Exam Vitals and nursing note reviewed.  Constitutional:      Appearance: She is well-developed.  Pulmonary:     Effort: Pulmonary effort is normal.  Musculoskeletal:        General: Normal range of motion.  Skin:    General: Skin is warm.  Neurological:     Mental Status: She is alert.    Neurovascular status found to be intact muscle strength adequate range of motion adequate with patient found to have incurvated medial border right hallux painful when pressed no active drainage or redness.  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  I infiltrated the right hallux 60 mg like Marcaine mixture sterile prep done and using sterile instrumentation remove the medial border exposed matrix applied phenol 3 applications 30 seconds followed by alcohol lavage sterile dressing instructed on leaving dressing on 24 hours but take it off earlier if throbbing were to occur and to call with questions or concerns.  Discussed midfoot do not recommend current treatment but she will reappoint 4 weeks to check and nail will be checked we will get x-rays and consider injection treatment to try to control symptoms

## 2021-12-25 NOTE — Patient Instructions (Signed)

## 2022-01-18 ENCOUNTER — Other Ambulatory Visit: Payer: Self-pay | Admitting: Family Medicine

## 2022-01-19 ENCOUNTER — Other Ambulatory Visit: Payer: Self-pay | Admitting: Family Medicine

## 2022-01-20 ENCOUNTER — Ambulatory Visit (INDEPENDENT_AMBULATORY_CARE_PROVIDER_SITE_OTHER): Payer: Medicare PPO

## 2022-01-20 ENCOUNTER — Ambulatory Visit: Payer: Medicare PPO | Admitting: Podiatry

## 2022-01-20 ENCOUNTER — Other Ambulatory Visit: Payer: Self-pay

## 2022-01-20 ENCOUNTER — Encounter: Payer: Self-pay | Admitting: Podiatry

## 2022-01-20 DIAGNOSIS — L600 Ingrowing nail: Secondary | ICD-10-CM | POA: Diagnosis not present

## 2022-01-20 DIAGNOSIS — M778 Other enthesopathies, not elsewhere classified: Secondary | ICD-10-CM

## 2022-01-20 MED ORDER — TRIAMCINOLONE ACETONIDE 10 MG/ML IJ SUSP
20.0000 mg | Freq: Once | INTRAMUSCULAR | Status: AC
  Administered 2022-01-20: 20 mg

## 2022-01-21 NOTE — Progress Notes (Signed)
Subjective:   Patient ID: Diana Vincent, female   DOB: 66 y.o.   MRN: [id number]   HPI Patient presents stating the nail site right is healing well but the top of both feet have been very sore and she wants to have these treated.  States that its been an ongoing issue for her   ROS      Objective:  Physical Exam  Neurovascular status intact with nail site right healing well crusted over with inflammation of the midtarsal joint bilateral with inflammation fluid right over left and pain.  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  I went ahead today did sterile prep and injected the midtarsal complex 3 mg dexamethasone Kenalog 5 mg Xylocaine after first discussing risk of these injections and that these can only be done periodically.  Reappoint when symptomatic  X-rays indicate there is roughness around the midtarsal joint bilateral with indications of arthritic process occurring across these joint surfaces bilateral

## 2022-02-21 ENCOUNTER — Other Ambulatory Visit: Payer: Self-pay | Admitting: Family Medicine

## 2022-02-26 DIAGNOSIS — N3946 Mixed incontinence: Secondary | ICD-10-CM | POA: Diagnosis not present

## 2022-03-10 ENCOUNTER — Other Ambulatory Visit (INDEPENDENT_AMBULATORY_CARE_PROVIDER_SITE_OTHER): Payer: Medicare PPO

## 2022-03-10 ENCOUNTER — Encounter: Payer: Self-pay | Admitting: Nurse Practitioner

## 2022-03-10 ENCOUNTER — Ambulatory Visit: Payer: Medicare PPO | Admitting: Nurse Practitioner

## 2022-03-10 VITALS — BP 121/60 | HR 94 | Ht 64.0 in | Wt 209.0 lb

## 2022-03-10 DIAGNOSIS — R197 Diarrhea, unspecified: Secondary | ICD-10-CM

## 2022-03-10 LAB — C-REACTIVE PROTEIN: CRP: <1 mg/dL (ref 0.5–20.0)

## 2022-03-10 LAB — SEDIMENTATION RATE: Sed Rate: 28 mm/hr (ref 0–30)

## 2022-03-10 NOTE — Progress Notes (Signed)
? ? ?Assessment and Plan  ? ?Patient Profile:  ?Diana Vincent is a 66 y.o. female known to Dr. Hilarie Fredrickson with a past medical history of adenomatous colon polyps, diverticulosis, cholecystectomy, chronic IBS, arthritis, HTN.  Additional medical history as listed in [place] . ? ?# Intermittent diarrhea, recurrent. She was bothering by this for years but did okay for last 10 years until the last month. She gives a history of IBS but also told years ago that she had regional enteritis. I explained the those are different entities.  Functional diarrhea seems most likely but cannot yet exclude IBD, microscopic colitis or other etiologies. Bile acid diarrhea seems unlikely given the sporadic nature. CBC, CMP normal.  ? ?# History of colon polyps. Two subcentimeter adenomatous polyps removed in June 2021 ? ?Plan:   ? ?tTg ,IgA ?Stool lactoferrin ?CMP and CBC are normal. Will obtain ESR, CRP ?Asked her to keep a food and symptom diary for the next 4-6 weeks while workup for organic disease is in progress.  ?I will see her back in 4-6 weeks ? ?History of Present Illness  ? ? ?Chief Complaint : intermittent diarrhea, upper abdominal discomfort ? ?Patient has a history of colon polyps and has undergone surveillance colonoscopies with [place] but hasn't need to be seen in office for anything.  ? ?Diarrhea  ?She gives a history of IBS and says she was diagnosed with about 30 years ago with "regional enteritis". She had episodes of diarrhea for 20 years but they subsided about 10 years ago. A couple of months ago she began having intermittent diarrhea again. The pattern is such that she has several episodes of diarrhea for one day then no BMs for a few days. This prompts her to take a dose of Miralax which gives her a semi-solid BM and put her back on track for about one week and then diarrhea occurs again. She feels confident that she is not constipated prior to onset of diarrhea suggesting overflow diarrhea. No medications or dietary changes  to attribute bowel changes to. She consumes only small amounts of diary because it gives her diarrhea / abdominal discomfort. Takes Diary-Eze if she does eat diary. Otherwise she cannot correlate diarrhea to any particular food. Doesn't use artificial sweeteners. Labs in January 2023 were normal. She did lose ~ 30 pounds a couple of year ago ( prior to retirement when she was having upper abdominal discomfort but weight stable over last several months.  ? ? ?Abdominal discomfort ?Cherisse gives a history of what she describes as acid sitting in the top of her stomach which results in burning sensation. Only very seldom does she get heartburn.   She was having upper abdominal burning a few years back but episodes were much more frequent at that time. 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 Crackers and Equate acid blockers help.  She has been taking Ibuprogen a couple of times a week but only over the last month and it hasn't caused any worsening of GI symptoms. No blood in stool, no black stool  ? ? ?Laboratory data: ? ?  Latest Ref Rng & Units 12/24/2021  ?  8:44 AM 03/25/2021  ? 11:03 AM 09/26/2020  ?  1:25 PM  ?Hepatic Function  ?Total Protein 6.0 - 8.3 g/dL 6.4   6.1   6.6    ?Albumin 3.5 - 5.2 g/dL 3.9  3.7   4.2    ?AST 0 - 37 U/L 15   13   16     ?ALT 0 - 35 U/L 15   15   18     ?Alk Phosphatase 39 - 117 U/L 74   45   47    ?Total Bilirubin 0.2 - 1.2 mg/dL 0.4   0.4   0.6    ?Bilirubin, Direct 0.0 - 0.3 mg/dL 0.1   0.1   0.2    ? ? ? ?  Latest Ref Rng & Units 12/24/2021  ?  8:44 AM 03/25/2021  ? 11:03 AM 09/26/2020  ?  1:25 PM  ?CBC  ?WBC 4.0 - 10.5 K/uL 6.5   7.5   6.4    ?Hemoglobin 12.0 - 15.0 g/dL 14.1   15.0   15.7    ?Hematocrit 36.0 - 46.0 % 41.7   44.7   45.8    ?Platelets 150.0 - 400.0 K/uL 348.0   326.0   380.0    ? ? ?Previous GI Evaluations  ? ?Endoscopies:  ? ?Last colonoscopy  ?05/23/20  polyp surveillance ?-Three 3 to 7 mm polyps in the ascending colon, removed with a cold snare. Resected andretrieved. ?- One 5 mm polyp in the descending colon, removed with a cold snare. Resected and ?retrieved. ?- Two 8 to 9 mm polyps in the sigmoid colon, removed with a cold snare. Resected and ?retrieved. ?- Diverticulosis in the sigmoid colon and in the descending colon. ?- Small internal hemorrhoids ? ?Diagnosis ?1. Surgical [P], colon, ascending, polyp (3) ?- TUBULAR ADENOMA(S). ?- HIGH GRADE DYSPLASIA IS NOT IDENTIFIED. ?2. Surgical [P], colon, sigmoid and descending, polyp (3) ?- TUBULAR ADENOMA(S). ?- HYPERPLASTIC POLYP(S). ?- HIGH GRADE DYSPLASIA IS NOT IDENTIFIED ? ?Past Medical History:  ?Diagnosis Date  ? Allergic rhinitis   ? Anxiety   ? Arthritis   ? Cataract   ? Depression   ? Head injury   ? hairline fx from fall out of swing-   ? Head injury without skull fracture   ? no LOC  ? Hypertension   ? ? ?Past Surgical History:  ?Procedure Laterality Date  ? ABDOMINAL HYSTERECTOMY  1990s  ? ANTERIOR AND POSTERIOR REPAIR  07/07/2012  ? Procedure: ANTERIOR (CYSTOCELE) AND POSTERIOR REPAIR (RECTOCELE);  Surgeon: Daria Pastures, MD;  Location: [facility];  Service: Gynecology;  Laterality: N/A;  ? BLADDER SUSPENSION  07/07/2012  ? Procedure: TRANSVAGINAL TAPE (TVT) PROCEDURE;  Surgeon: Daria Pastures, MD;  Location: [facility];  Service: Gynecology;  Laterality: N/A;  ? CATARACT EXTRACTION Right   ? [place]  ? CATARACT EXTRACTION Left   ? [place]  ? CHOLECYSTECTOMY  2009  ? COLONOSCOPY    ? CYSTOSCOPY  07/07/2012  ? Procedure: CYSTOSCOPY;  Surgeon: Daria Pastures, MD;  Location: [facility];  Service: Gynecology;;  ? POLYPECTOMY    ? TOTAL HIP ARTHROPLASTY Right   ? ? ?Current Medications, Allergies, Family History and Social History were reviewed in [employer] record. ?  ?  ?Current Outpatient Medications  ?Medication Sig Dispense Refill  ? amLODipine (NORVASC) 5 MG tablet TAKE 1  TABLET(5 MG) BY MOUTH DAILY 90 tablet 0  ? Biotin 5 MG CAPS Take by mouth daily.    ? Cholecalciferol (D3 ADULT PO) Take by mouth daily.    ? lamoTRIgine (LAMICTAL) 150 MG tablet Take 1 tablet (150 mg total) by mouth daily. 90 tablet 1  ? Loratadine (CLARITIN PO) Take by mouth  daily.    ? olmesartan-hydrochlorothiazide (BENICAR HCT) 40-12.5 MG tablet TAKE 1 TABLET BY MOUTH DAILY 90 tablet 0  ? zolpidem (AMBIEN) 10 MG tablet Take 1 tablet (10 mg total) by mouth at bedtime as needed. For sleep 30 tablet 3  ? ?No current facility-administered medications for this visit.  ? ? ?Review of Systems: ?Positive for urinary leakage. No chest pain. No shortness of breath.  ? ?Physical Exam:   ? ?Wt Readings from Last 3 Encounters:  ?03/10/22 209 lb (94.8 kg)  ?12/20/21 210 lb 12.8 oz (95.6 kg)  ?03/25/21 197 lb 6.4 oz (89.5 kg)  ? ? ?BP 121/60   Pulse 94   Ht 5' 4"  (1.626 m)   Wt 209 lb (94.8 kg)   SpO2 96%   BMI 35.87 kg/m?  ?Constitutional:  Generally well appearing female in no acute distress. ?Psychiatric: Pleasant. Normal mood and affect. Behavior is normal. ?EENT: Pupils normal.  Conjunctivae are normal. No scleral icterus. ?Neck supple.  ?Cardiovascular: Normal rate, regular rhythm. No edema ?Pulmonary/chest: Effort normal and breath sounds normal. No wheezing, rales or rhonchi. ?Abdominal: Soft, nondistended, nontender. Bowel sounds active throughout. There are no masses palpable. No hepatomegaly. ?Neurological: Alert and oriented to person place and time. ?Skin: Skin is warm and dry. No rashes noted. ? ?Tye Savoy, NP  03/10/2022, 10:01 AM ? ? ? ? ? ? ? ? ? ?

## 2022-03-10 NOTE — Progress Notes (Signed)
Addendum: Reviewed and agree with assessment and management plan. Kipp Shank M, MD  

## 2022-03-10 NOTE — Patient Instructions (Addendum)
?  Your provider has requested that you go to the basement level for lab work before leaving today. Press "B" on the elevator. The lab is located at the first door on the left as you exit the elevator. ? ?Keep a food diary ? ?Please keep follow-up on: '5 3 23 '$ at 9:00am ? ?If you are age 66 or older, your body mass index should be between 23-30. Your Body mass index is 35.87 kg/m?Marland Kitchen If this is out of the aforementioned range listed, please consider follow up with your Primary Care Provider. ? ? ?The [facility] providers would like to encourage you to use [facility] to communicate with providers for non-urgent requests or questions.  Due to long hold times on the telephone, sending your provider a message by [facility] may be a faster and more efficient way to get a response.  Please allow 48 business hours for a response.  Please remember that this is for non-urgent requests.  ?_______________________________________________________ ? ?Thank you for choosing me and [facility]. ? ? ? ?

## 2022-03-11 LAB — TISSUE TRANSGLUTAMINASE ABS,IGG,IGA
(tTG) Ab, IgA: <1 U/mL
(tTG) Ab, IgG: <1 U/mL

## 2022-03-11 LAB — IGA: Immunoglobulin A: 151 mg/dL (ref 70–320)

## 2022-03-24 ENCOUNTER — Other Ambulatory Visit: Payer: Self-pay | Admitting: Family Medicine

## 2022-03-24 DIAGNOSIS — N3946 Mixed incontinence: Secondary | ICD-10-CM | POA: Diagnosis not present

## 2022-03-27 ENCOUNTER — Other Ambulatory Visit: Payer: Medicare PPO

## 2022-03-27 DIAGNOSIS — R197 Diarrhea, unspecified: Secondary | ICD-10-CM

## 2022-03-28 DIAGNOSIS — M1712 Unilateral primary osteoarthritis, left knee: Secondary | ICD-10-CM | POA: Diagnosis not present

## 2022-03-28 DIAGNOSIS — M170 Bilateral primary osteoarthritis of knee: Secondary | ICD-10-CM | POA: Diagnosis not present

## 2022-03-28 DIAGNOSIS — M1711 Unilateral primary osteoarthritis, right knee: Secondary | ICD-10-CM | POA: Diagnosis not present

## 2022-03-28 LAB — TIQ-NTM

## 2022-03-31 LAB — FECAL LACTOFERRIN, QUANT
Fecal Lactoferrin: NEGATIVE
MICRO NUMBER:: 13259587
SPECIMEN QUALITY:: ADEQUATE

## 2022-04-04 DIAGNOSIS — M170 Bilateral primary osteoarthritis of knee: Secondary | ICD-10-CM | POA: Diagnosis not present

## 2022-04-04 DIAGNOSIS — M1712 Unilateral primary osteoarthritis, left knee: Secondary | ICD-10-CM | POA: Diagnosis not present

## 2022-04-04 DIAGNOSIS — N3946 Mixed incontinence: Secondary | ICD-10-CM | POA: Diagnosis not present

## 2022-04-04 DIAGNOSIS — M1711 Unilateral primary osteoarthritis, right knee: Secondary | ICD-10-CM | POA: Diagnosis not present

## 2022-04-11 DIAGNOSIS — M1711 Unilateral primary osteoarthritis, right knee: Secondary | ICD-10-CM | POA: Diagnosis not present

## 2022-04-16 ENCOUNTER — Ambulatory Visit: Payer: Medicare PPO | Admitting: Nurse Practitioner

## 2022-04-17 ENCOUNTER — Encounter: Payer: Self-pay | Admitting: Podiatry

## 2022-04-17 ENCOUNTER — Ambulatory Visit: Payer: Medicare PPO | Admitting: Podiatry

## 2022-04-17 DIAGNOSIS — M778 Other enthesopathies, not elsewhere classified: Secondary | ICD-10-CM

## 2022-04-17 MED ORDER — TRIAMCINOLONE ACETONIDE 10 MG/ML IJ SUSP
10.0000 mg | Freq: Once | INTRAMUSCULAR | Status: AC
Start: 2022-04-17 — End: 2022-04-17
  Administered 2022-04-17: 10 mg

## 2022-04-17 NOTE — Progress Notes (Signed)
Subjective:  ? ?Patient ID: Diana Vincent, female   DOB: 66 y.o.   MRN: [id number]  ? ?HPI ?Patient presents stating ingrown's doing well but I am having a lot of pain on top of my foot but it is more on the lateral side ? ? ?ROS ? ? ?   ?Objective:  ?Physical Exam  ?Neurovascular status intact with inflammation of the extensor complex more in the lateral portion of the extensor complex ? ?   ?Assessment:  ?Extensor tendinitis right with inflammation more on the lateral side ? ?   ?Plan:  ?Explained chances for rupture and that eventually it may require CT scan MRI with possibility for surgery but again continue to try conservative and I did sterile prep injected the dorsal lateral aspect of the extensor complex 3 mg Dexasone Kenalog 5 mg Xylocaine and discussed continue topical medicines ?   ? ? ?

## 2022-04-18 ENCOUNTER — Ambulatory Visit: Payer: Medicare PPO | Admitting: Nurse Practitioner

## 2022-04-18 ENCOUNTER — Encounter: Payer: Self-pay | Admitting: Nurse Practitioner

## 2022-04-18 VITALS — BP 122/78 | HR 74 | Ht 64.0 in | Wt 210.4 lb

## 2022-04-18 DIAGNOSIS — K589 Irritable bowel syndrome without diarrhea: Secondary | ICD-10-CM

## 2022-04-18 NOTE — Progress Notes (Signed)
? ? ?, ?Assessment  ? ?Patient Profile:  ?Diana Vincent is a 66 y.o. female known to Dr. Hilarie Fredrickson with a past medical history of adenomatous colon polyps, diverticulosis, cholecystectomy, IBS, arthritis, HTN. Additional medical history as listed in [place] . ? ? ?Episodic diarrhea, suspect functional / ? maybe a food intolerance. Episodes are very sporadic.  ?tTg, systemic inflammatory markers, fecal lactoferrin were normal.  Weight is stable.  ? ?History of colon polyps.  ?Several polyps ( TA and HP) removed in June 2021.  Surveillance colonoscopy due June 2024 ? ?Plan  ? ?Follow up as needed. Happy to see her back for any changes in or worsening of symptoms ? ? ?HPI  ? ?Chief Complaint : follow up on diarrhea ? ?Diana Vincent was seen late March for evaluation of intermittent diarrhea.  She has a history of IBS but had gone for many years without symptoms.  When I saw her in March she had been having intermittent diarrhea for a couple of months. The diarrhea was very sporadic and BMs were described as normal in between times.  Her weight was stable. A few tests were ordered, she was asked to keep a food diary.  ? ?Interval history :  ?Diana Vincent returns for follow up.  ?Stool lactoferrin was negative, tTG negative, CRP and ESR were normal.  ? ?Only one episode of diarrhea since I last saw her. She kept a food diary. The days before the episode of diarrhea she had a [place] hotdog with chili.  ? ?Even though she hasn't had any recent major episodes of diarrhea since I last saw her she feels like her bowels habits have changed. Several previously tolerated foods are now causing lower abdomen to "gripe" . Though her BMs are formed she has more frequent BMs than she used to and stools are smaller in diameter. No blood in stool. Stools not oily. Her weight is still table.  ? ?Laboratory data: ? ?  Latest Ref Rng & Units 12/24/2021  ?  8:44 AM 03/25/2021  ? 11:03 AM 09/26/2020  ?  1:25 PM  ?Hepatic Function  ?Total Protein 6.0 - 8.3 g/dL 6.4    6.1   6.6    ?Albumin 3.5 - 5.2 g/dL 3.9   3.7   4.2    ?AST 0 - 37 U/L 15   13   16     ?ALT 0 - 35 U/L 15   15   18     ?Alk Phosphatase 39 - 117 U/L 74   45   47    ?Total Bilirubin 0.2 - 1.2 mg/dL 0.4   0.4   0.6    ?Bilirubin, Direct 0.0 - 0.3 mg/dL 0.1   0.1   0.2    ? ? ? ?  Latest Ref Rng & Units 12/24/2021  ?  8:44 AM 03/25/2021  ? 11:03 AM 09/26/2020  ?  1:25 PM  ?CBC  ?WBC 4.0 - 10.5 K/uL 6.5   7.5   6.4    ?Hemoglobin 12.0 - 15.0 g/dL 14.1   15.0   15.7    ?Hematocrit 36.0 - 46.0 % 41.7   44.7   45.8    ?Platelets 150.0 - 400.0 K/uL 348.0   326.0   380.0    ? ?Previous GI Evaluation  ? ?June 2021 polyp surveillance colonoscopy  ?Three 3 to 7 mm polyps in the ascending colon, removed with a cold snare. Resected and retrieved. ?- One 5 mm polyp in the descending colon, removed  with a cold snare. Resected and retrieved. ?- Two 8 to 9 mm polyps in the sigmoid colon, removed with a cold snare. Resected and retrieved. ?- Diverticulosis in the sigmoid colon and in the descending colon. ?- Small internal hemorrhoids. ? ?1.Surgical [P], colon, ascending, polyp (3) ?- TUBULAR ADENOMA(S). ?- HIGH GRADE DYSPLASIA IS NOT IDENTIFIED. ?2. Surgical [P], colon, sigmoid and descending, polyp (3) ?- TUBULAR ADENOMA(S). ?- HYPERPLASTIC POLYP(S). ?- HIGH GRADE DYSPLASIA IS NOT IDENTIFIED. ? ?Past Medical History:  ?Diagnosis Date  ? Allergic rhinitis   ? Anxiety   ? Arthritis   ? Cataract   ? Depression   ? Head injury   ? hairline fx from fall out of swing-   ? Head injury without skull fracture   ? no LOC  ? Hypertension   ? ? ?Past Surgical History:  ?Procedure Laterality Date  ? ABDOMINAL HYSTERECTOMY  1990s  ? ANTERIOR AND POSTERIOR REPAIR  07/07/2012  ? Procedure: ANTERIOR (CYSTOCELE) AND POSTERIOR REPAIR (RECTOCELE);  Surgeon: Daria Pastures, MD;  Location: [facility];  Service: Gynecology;  Laterality: N/A;  ? BLADDER SUSPENSION  07/07/2012  ? Procedure: TRANSVAGINAL TAPE (TVT) PROCEDURE;  Surgeon: Daria Pastures, MD;   Location: [facility];  Service: Gynecology;  Laterality: N/A;  ? CATARACT EXTRACTION Right   ? [place]  ? CATARACT EXTRACTION Left   ? [place]  ? CHOLECYSTECTOMY  2009  ? COLONOSCOPY    ? CYSTOSCOPY  07/07/2012  ? Procedure: CYSTOSCOPY;  Surgeon: Daria Pastures, MD;  Location: [facility];  Service: Gynecology;;  ? POLYPECTOMY    ? TOTAL HIP ARTHROPLASTY Right   ? ? ?Current Medications, Allergies, Family History and Social History were reviewed in [employer] record. ?  ?  ?Current Outpatient Medications  ?Medication Sig Dispense Refill  ? amLODipine (NORVASC) 5 MG tablet TAKE 1 TABLET(5 MG) BY MOUTH DAILY 90 tablet 0  ? Biotin 5 MG CAPS Take by mouth daily.    ? Cholecalciferol (D3 ADULT PO) Take by mouth daily.    ? lamoTRIgine (LAMICTAL) 150 MG tablet Take 1 tablet (150 mg total) by mouth daily. 90 tablet 1  ? Loratadine (CLARITIN PO) Take by mouth daily.    ? MYRBETRIQ 25 MG TB24 tablet Take by mouth.    ? olmesartan-hydrochlorothiazide (BENICAR HCT) 40-12.5 MG tablet TAKE 1 TABLET BY MOUTH DAILY 90 tablet 0  ? zolpidem (AMBIEN) 10 MG tablet Take 1 tablet (10 mg total) by mouth at bedtime as needed. For sleep 30 tablet 3  ? ?No current facility-administered medications for this visit.  ? ? ?Review of Systems: ?No chest pain. No shortness of breath. No urinary complaints.  ? ? ?Physical Exam ? ?Wt Readings from Last 3 Encounters:  ?04/18/22 210 lb 6.4 oz (95.4 kg)  ?03/10/22 209 lb (94.8 kg)  ?12/20/21 210 lb 12.8 oz (95.6 kg)  ? ? ?BP 122/78   Pulse 74   Ht 5' 4"  (1.626 m)   Wt 210 lb 6.4 oz (95.4 kg)   SpO2 97%   BMI 36.12 kg/m?  ?Constitutional:  Generally well appearing female in no acute distress. ?Psychiatric: Pleasant. Normal mood and affect. Behavior is normal. ?EENT: Pupils normal.  Conjunctivae are normal. No scleral icterus. ?Neck supple.  ?Cardiovascular: Normal rate, regular rhythm. No edema ?Pulmonary/chest: Effort normal and breath sounds normal. No wheezing, rales or  rhonchi. ?Abdominal: Soft, nondistended, nontender. Bowel sounds active throughout. There are no masses palpable. No hepatomegaly. ?Neurological: Alert and oriented  to person place and time. ?Skin: Skin is warm and dry. No rashes noted. ? ?Tye Savoy, NP  04/18/2022, 9:15 AM ? ? ? ? ? ? ? ? ? ?

## 2022-04-18 NOTE — Patient Instructions (Signed)
You may use Imodium 2 mg as needed to help with Diarrhea.  ? ?Follow up as needed.  ? ?Thank you for choosing me and [facility]. ?  ?  ?

## 2022-04-21 ENCOUNTER — Telehealth: Payer: Self-pay | Admitting: Family Medicine

## 2022-04-21 ENCOUNTER — Other Ambulatory Visit: Payer: Self-pay

## 2022-04-21 MED ORDER — ZOLPIDEM TARTRATE 10 MG PO TABS: 10.0000 mg | ORAL_TABLET | Freq: Every evening | ORAL | 3 refills | Status: DC | PRN

## 2022-04-21 MED ORDER — OLMESARTAN MEDOXOMIL-HCTZ 40-12.5 MG PO TABS: 1.0000 | ORAL_TABLET | Freq: Every day | ORAL | 0 refills | Status: DC

## 2022-04-21 NOTE — Telephone Encounter (Signed)
Pt called in asking for a refill on the olmwsartan and zolpidem  ? ?Please advise  ?

## 2022-04-21 NOTE — Telephone Encounter (Signed)
Patient aware that medications have been sent ?

## 2022-04-22 MED ORDER — ZOLPIDEM TARTRATE 10 MG PO TABS: 10.0000 mg | ORAL_TABLET | Freq: Every evening | ORAL | 3 refills | Status: DC | PRN

## 2022-04-22 NOTE — Telephone Encounter (Signed)
Patient called in stating that pharmacy has not received the Ambien and patient would like to know if we can refax it.  ?

## 2022-04-22 NOTE — Addendum Note (Signed)
Addended by: Midge Minium on: 04/22/2022 11:50 AM ? ? Modules accepted: Orders ? ?

## 2022-04-22 NOTE — Progress Notes (Signed)
Addendum: Reviewed and agree with assessment and management plan. Lititia Sen M, MD  

## 2022-05-14 DIAGNOSIS — M25552 Pain in left hip: Secondary | ICD-10-CM | POA: Diagnosis not present

## 2022-05-19 ENCOUNTER — Other Ambulatory Visit: Payer: Self-pay

## 2022-05-19 DIAGNOSIS — Z961 Presence of intraocular lens: Secondary | ICD-10-CM

## 2022-05-19 MED ORDER — LAMOTRIGINE 150 MG PO TABS: 150.0000 mg | ORAL_TABLET | Freq: Every day | ORAL | 1 refills | Status: DC

## 2022-05-23 DIAGNOSIS — R3915 Urgency of urination: Secondary | ICD-10-CM | POA: Diagnosis not present

## 2022-05-23 DIAGNOSIS — N393 Stress incontinence (female) (male): Secondary | ICD-10-CM | POA: Diagnosis not present

## 2022-05-29 DIAGNOSIS — M5134 Other intervertebral disc degeneration, thoracic region: Secondary | ICD-10-CM | POA: Diagnosis not present

## 2022-05-29 DIAGNOSIS — M9901 Segmental and somatic dysfunction of cervical region: Secondary | ICD-10-CM | POA: Diagnosis not present

## 2022-05-29 DIAGNOSIS — M9902 Segmental and somatic dysfunction of thoracic region: Secondary | ICD-10-CM | POA: Diagnosis not present

## 2022-05-29 DIAGNOSIS — M5032 Other cervical disc degeneration, mid-cervical region, unspecified level: Secondary | ICD-10-CM | POA: Diagnosis not present

## 2022-06-03 DIAGNOSIS — M9902 Segmental and somatic dysfunction of thoracic region: Secondary | ICD-10-CM | POA: Diagnosis not present

## 2022-06-03 DIAGNOSIS — M9901 Segmental and somatic dysfunction of cervical region: Secondary | ICD-10-CM | POA: Diagnosis not present

## 2022-06-03 DIAGNOSIS — M5134 Other intervertebral disc degeneration, thoracic region: Secondary | ICD-10-CM | POA: Diagnosis not present

## 2022-06-03 DIAGNOSIS — M5032 Other cervical disc degeneration, mid-cervical region, unspecified level: Secondary | ICD-10-CM | POA: Diagnosis not present

## 2022-06-04 DIAGNOSIS — M5134 Other intervertebral disc degeneration, thoracic region: Secondary | ICD-10-CM | POA: Diagnosis not present

## 2022-06-04 DIAGNOSIS — M9901 Segmental and somatic dysfunction of cervical region: Secondary | ICD-10-CM | POA: Diagnosis not present

## 2022-06-04 DIAGNOSIS — M5032 Other cervical disc degeneration, mid-cervical region, unspecified level: Secondary | ICD-10-CM | POA: Diagnosis not present

## 2022-06-04 DIAGNOSIS — M9902 Segmental and somatic dysfunction of thoracic region: Secondary | ICD-10-CM | POA: Diagnosis not present

## 2022-06-06 DIAGNOSIS — M1612 Unilateral primary osteoarthritis, left hip: Secondary | ICD-10-CM | POA: Diagnosis not present

## 2022-06-06 DIAGNOSIS — M25551 Pain in right hip: Secondary | ICD-10-CM | POA: Diagnosis not present

## 2022-06-06 DIAGNOSIS — M1712 Unilateral primary osteoarthritis, left knee: Secondary | ICD-10-CM | POA: Diagnosis not present

## 2022-06-06 DIAGNOSIS — M25552 Pain in left hip: Secondary | ICD-10-CM | POA: Diagnosis not present

## 2022-06-09 ENCOUNTER — Encounter (INDEPENDENT_AMBULATORY_CARE_PROVIDER_SITE_OTHER): Payer: Self-pay | Admitting: Ophthalmology

## 2022-06-09 ENCOUNTER — Ambulatory Visit (INDEPENDENT_AMBULATORY_CARE_PROVIDER_SITE_OTHER): Payer: Medicare PPO | Admitting: Ophthalmology

## 2022-06-09 DIAGNOSIS — H353113 Nonexudative age-related macular degeneration, right eye, advanced atrophic without subfoveal involvement: Secondary | ICD-10-CM

## 2022-06-09 DIAGNOSIS — H353123 Nonexudative age-related macular degeneration, left eye, advanced atrophic without subfoveal involvement: Secondary | ICD-10-CM | POA: Diagnosis not present

## 2022-06-09 DIAGNOSIS — H353114 Nonexudative age-related macular degeneration, right eye, advanced atrophic with subfoveal involvement: Secondary | ICD-10-CM

## 2022-06-10 DIAGNOSIS — M5032 Other cervical disc degeneration, mid-cervical region, unspecified level: Secondary | ICD-10-CM | POA: Diagnosis not present

## 2022-06-10 DIAGNOSIS — M5134 Other intervertebral disc degeneration, thoracic region: Secondary | ICD-10-CM | POA: Diagnosis not present

## 2022-06-10 DIAGNOSIS — M9902 Segmental and somatic dysfunction of thoracic region: Secondary | ICD-10-CM | POA: Diagnosis not present

## 2022-06-10 DIAGNOSIS — M9901 Segmental and somatic dysfunction of cervical region: Secondary | ICD-10-CM | POA: Diagnosis not present

## 2022-06-12 DIAGNOSIS — M5134 Other intervertebral disc degeneration, thoracic region: Secondary | ICD-10-CM | POA: Diagnosis not present

## 2022-06-12 DIAGNOSIS — M5032 Other cervical disc degeneration, mid-cervical region, unspecified level: Secondary | ICD-10-CM | POA: Diagnosis not present

## 2022-06-12 DIAGNOSIS — M9901 Segmental and somatic dysfunction of cervical region: Secondary | ICD-10-CM | POA: Diagnosis not present

## 2022-06-12 DIAGNOSIS — M9902 Segmental and somatic dysfunction of thoracic region: Secondary | ICD-10-CM | POA: Diagnosis not present

## 2022-06-18 DIAGNOSIS — M9901 Segmental and somatic dysfunction of cervical region: Secondary | ICD-10-CM | POA: Diagnosis not present

## 2022-06-18 DIAGNOSIS — M9902 Segmental and somatic dysfunction of thoracic region: Secondary | ICD-10-CM | POA: Diagnosis not present

## 2022-06-18 DIAGNOSIS — M5134 Other intervertebral disc degeneration, thoracic region: Secondary | ICD-10-CM | POA: Diagnosis not present

## 2022-06-18 DIAGNOSIS — M5032 Other cervical disc degeneration, mid-cervical region, unspecified level: Secondary | ICD-10-CM | POA: Diagnosis not present

## 2022-06-23 ENCOUNTER — Other Ambulatory Visit: Payer: Self-pay

## 2022-06-23 MED ORDER — AMLODIPINE BESYLATE 5 MG PO TABS: ORAL_TABLET | ORAL | 0 refills | Status: DC

## 2022-06-24 DIAGNOSIS — M9901 Segmental and somatic dysfunction of cervical region: Secondary | ICD-10-CM | POA: Diagnosis not present

## 2022-06-24 DIAGNOSIS — M5032 Other cervical disc degeneration, mid-cervical region, unspecified level: Secondary | ICD-10-CM | POA: Diagnosis not present

## 2022-06-24 DIAGNOSIS — M5134 Other intervertebral disc degeneration, thoracic region: Secondary | ICD-10-CM | POA: Diagnosis not present

## 2022-06-24 DIAGNOSIS — M9902 Segmental and somatic dysfunction of thoracic region: Secondary | ICD-10-CM | POA: Diagnosis not present

## 2022-07-01 DIAGNOSIS — M9902 Segmental and somatic dysfunction of thoracic region: Secondary | ICD-10-CM | POA: Diagnosis not present

## 2022-07-01 DIAGNOSIS — M5134 Other intervertebral disc degeneration, thoracic region: Secondary | ICD-10-CM | POA: Diagnosis not present

## 2022-07-01 DIAGNOSIS — M5032 Other cervical disc degeneration, mid-cervical region, unspecified level: Secondary | ICD-10-CM | POA: Diagnosis not present

## 2022-07-01 DIAGNOSIS — M9901 Segmental and somatic dysfunction of cervical region: Secondary | ICD-10-CM | POA: Diagnosis not present

## 2022-07-07 ENCOUNTER — Telehealth: Payer: Self-pay | Admitting: Family Medicine

## 2022-07-07 ENCOUNTER — Other Ambulatory Visit: Payer: Self-pay

## 2022-07-07 DIAGNOSIS — Z961 Presence of intraocular lens: Secondary | ICD-10-CM

## 2022-07-07 MED ORDER — OLMESARTAN MEDOXOMIL-HCTZ 40-12.5 MG PO TABS: 1.0000 | ORAL_TABLET | Freq: Every day | ORAL | 0 refills | Status: DC

## 2022-07-07 MED ORDER — AMLODIPINE BESYLATE 5 MG PO TABS: ORAL_TABLET | ORAL | 0 refills | Status: DC

## 2022-07-07 MED ORDER — LAMOTRIGINE 150 MG PO TABS: 150.0000 mg | ORAL_TABLET | Freq: Every day | ORAL | 1 refills | Status: DC

## 2022-07-07 NOTE — Telephone Encounter (Signed)
Patient needs a refill for the above medications.   zolpidem (AMBIEN) 10 MG tablet  lamoTRIgine (LAMICTAL) 150 MG tablet  amLODipine (NORVASC) 5 MG tablet  olmesartan-hydrochlorothiazide (BENICAR HCT) 40-12.5 MG tablet

## 2022-07-08 DIAGNOSIS — M9901 Segmental and somatic dysfunction of cervical region: Secondary | ICD-10-CM | POA: Diagnosis not present

## 2022-07-08 DIAGNOSIS — M9902 Segmental and somatic dysfunction of thoracic region: Secondary | ICD-10-CM | POA: Diagnosis not present

## 2022-07-08 DIAGNOSIS — M5134 Other intervertebral disc degeneration, thoracic region: Secondary | ICD-10-CM | POA: Diagnosis not present

## 2022-07-08 DIAGNOSIS — M5032 Other cervical disc degeneration, mid-cervical region, unspecified level: Secondary | ICD-10-CM | POA: Diagnosis not present

## 2022-07-08 MED ORDER — ZOLPIDEM TARTRATE 10 MG PO TABS
10.0000 mg | ORAL_TABLET | Freq: Every evening | ORAL | 3 refills | Status: DC | PRN
Start: 2022-07-08 — End: 2022-12-22

## 2022-07-08 MED ORDER — OLMESARTAN MEDOXOMIL-HCTZ 40-12.5 MG PO TABS
1.0000 | ORAL_TABLET | Freq: Every day | ORAL | 0 refills | Status: DC
Start: 2022-07-08 — End: 2023-01-05

## 2022-07-08 MED ORDER — AMLODIPINE BESYLATE 5 MG PO TABS: ORAL_TABLET | ORAL | 0 refills | Status: DC

## 2022-07-08 MED ORDER — LAMOTRIGINE 150 MG PO TABS: 150.0000 mg | ORAL_TABLET | Freq: Every day | ORAL | 1 refills | Status: DC

## 2022-07-08 NOTE — Addendum Note (Signed)
Addended by: Midge Minium on: 07/08/2022 07:20 AM   Modules accepted: Orders

## 2022-07-08 NOTE — Telephone Encounter (Signed)
Prescriptions filled at pt's request

## 2022-07-15 DIAGNOSIS — M9902 Segmental and somatic dysfunction of thoracic region: Secondary | ICD-10-CM | POA: Diagnosis not present

## 2022-07-15 DIAGNOSIS — M9901 Segmental and somatic dysfunction of cervical region: Secondary | ICD-10-CM | POA: Diagnosis not present

## 2022-07-15 DIAGNOSIS — M5032 Other cervical disc degeneration, mid-cervical region, unspecified level: Secondary | ICD-10-CM | POA: Diagnosis not present

## 2022-07-15 DIAGNOSIS — M5134 Other intervertebral disc degeneration, thoracic region: Secondary | ICD-10-CM | POA: Diagnosis not present

## 2022-07-25 DIAGNOSIS — M1712 Unilateral primary osteoarthritis, left knee: Secondary | ICD-10-CM | POA: Diagnosis not present

## 2022-07-25 DIAGNOSIS — M25552 Pain in left hip: Secondary | ICD-10-CM | POA: Diagnosis not present

## 2022-07-30 DIAGNOSIS — M25552 Pain in left hip: Secondary | ICD-10-CM | POA: Diagnosis not present

## 2022-09-16 ENCOUNTER — Other Ambulatory Visit: Payer: Self-pay

## 2022-09-16 DIAGNOSIS — I10 Essential (primary) hypertension: Secondary | ICD-10-CM

## 2022-09-16 MED ORDER — AMLODIPINE BESYLATE 5 MG PO TABS: ORAL_TABLET | ORAL | 1 refills | Status: DC

## 2022-09-17 ENCOUNTER — Other Ambulatory Visit: Payer: Self-pay

## 2022-09-17 DIAGNOSIS — I10 Essential (primary) hypertension: Secondary | ICD-10-CM

## 2022-09-17 MED ORDER — AMLODIPINE BESYLATE 5 MG PO TABS: ORAL_TABLET | ORAL | 1 refills | Status: DC

## 2022-10-13 ENCOUNTER — Encounter (INDEPENDENT_AMBULATORY_CARE_PROVIDER_SITE_OTHER): Payer: Medicare PPO | Admitting: Ophthalmology

## 2022-10-31 ENCOUNTER — Ambulatory Visit: Payer: Medicare PPO | Admitting: Podiatry

## 2022-10-31 ENCOUNTER — Ambulatory Visit (INDEPENDENT_AMBULATORY_CARE_PROVIDER_SITE_OTHER): Payer: Medicare PPO

## 2022-10-31 ENCOUNTER — Encounter: Payer: Self-pay | Admitting: Podiatry

## 2022-10-31 DIAGNOSIS — M778 Other enthesopathies, not elsewhere classified: Secondary | ICD-10-CM | POA: Diagnosis not present

## 2022-10-31 DIAGNOSIS — L600 Ingrowing nail: Secondary | ICD-10-CM | POA: Diagnosis not present

## 2022-10-31 MED ORDER — TRIAMCINOLONE ACETONIDE 10 MG/ML IJ SUSP: 10.0000 mg | Freq: Once | INTRAMUSCULAR | Status: DC

## 2022-10-31 NOTE — Patient Instructions (Signed)

## 2022-11-01 NOTE — Progress Notes (Signed)
Subjective:   Patient ID: Diana Vincent, female   DOB: 66 y.o.   MRN: [id number]   HPI Patient states over the last few weeks she started develop pain on top of the right foot again and she has had trouble with an ingrown toenail of the left big toe that is been tender   ROS      Objective:  Physical Exam  Neurovascular status intact with 2 separate problems with 1 being dorsal tendinitis left and secondarily an incurvated nail bed left hallux that is painful when pressed medial border     Assessment:  Chronic dorsal tendinitis which does well with conservative treatment and chronic ingrown toenail deformity left      Plan:  H&P reviewed both conditions and for the nail at 1 point permanent procedure will be necessary but she is going out of town this week so I do not recommend until she returns.  For the right I did do sterile prep I injected the dorsal tendon complex 3 mg dexamethasone Kenalog 5 mg Xylocaine advised on heat ice therapy and reappoint to recheck  X-ray dated today indicates that there is a arthritis of the midtarsal joint right explaining why she has had reoccurrence of discomfort.  I did not see significant changes when compared to previous views

## 2022-11-10 DIAGNOSIS — M170 Bilateral primary osteoarthritis of knee: Secondary | ICD-10-CM | POA: Diagnosis not present

## 2022-11-17 DIAGNOSIS — H353123 Nonexudative age-related macular degeneration, left eye, advanced atrophic without subfoveal involvement: Secondary | ICD-10-CM | POA: Diagnosis not present

## 2022-11-17 DIAGNOSIS — M170 Bilateral primary osteoarthritis of knee: Secondary | ICD-10-CM | POA: Diagnosis not present

## 2022-11-17 DIAGNOSIS — H43823 Vitreomacular adhesion, bilateral: Secondary | ICD-10-CM | POA: Diagnosis not present

## 2022-11-17 DIAGNOSIS — H353114 Nonexudative age-related macular degeneration, right eye, advanced atrophic with subfoveal involvement: Secondary | ICD-10-CM | POA: Diagnosis not present

## 2022-11-20 DIAGNOSIS — Z01419 Encounter for gynecological examination (general) (routine) without abnormal findings: Secondary | ICD-10-CM | POA: Diagnosis not present

## 2022-11-20 DIAGNOSIS — Z1231 Encounter for screening mammogram for malignant neoplasm of breast: Secondary | ICD-10-CM | POA: Diagnosis not present

## 2022-11-20 DIAGNOSIS — Z7689 Persons encountering health services in other specified circumstances: Secondary | ICD-10-CM | POA: Diagnosis not present

## 2022-11-20 LAB — HM MAMMOGRAPHY

## 2022-11-21 DIAGNOSIS — N3020 Other chronic cystitis without hematuria: Secondary | ICD-10-CM | POA: Diagnosis not present

## 2022-11-21 DIAGNOSIS — N3946 Mixed incontinence: Secondary | ICD-10-CM | POA: Diagnosis not present

## 2022-11-24 DIAGNOSIS — M170 Bilateral primary osteoarthritis of knee: Secondary | ICD-10-CM | POA: Diagnosis not present

## 2022-12-10 DIAGNOSIS — M25552 Pain in left hip: Secondary | ICD-10-CM | POA: Diagnosis not present

## 2022-12-22 ENCOUNTER — Other Ambulatory Visit: Payer: Self-pay

## 2022-12-22 ENCOUNTER — Other Ambulatory Visit: Payer: Self-pay | Admitting: Family Medicine

## 2022-12-22 MED ORDER — ZOLPIDEM TARTRATE 10 MG PO TABS: 10.0000 mg | ORAL_TABLET | Freq: Every evening | ORAL | 1 refills | Status: DC | PRN

## 2022-12-22 NOTE — Telephone Encounter (Signed)
Informed pt that we sent in her Rx to [facility]

## 2022-12-22 NOTE — Telephone Encounter (Signed)
Zolpidem 10 mg LOV: 12/20/21 Last Refill:07/08/22 Upcoming appt: no apt scheduled

## 2022-12-22 NOTE — Telephone Encounter (Signed)
Zolpidem 10 mg LOV: 12/20/21 Last Refill:07/08/22 Upcoming appt: none

## 2022-12-22 NOTE — Telephone Encounter (Signed)
Encourage patient to contact the pharmacy for refills or they can request refills through [facility]  (Please schedule appointment if patient has not been seen in over a year)    WHAT PHARMACY WOULD THEY LIKE THIS SENT TO: [id number] - [place], [address] AT [address] NAME & DOSE: zolpidem 10 mg   NOTES/COMMENTS FROM PATIENT: patient has appt on 01/05/23 with Dr.Tabori . Patient wants to know if Dr.Tabori could send in a partial refill for this medication until her appt.      [place] office please notify patient: It takes 48-72 hours to process rx refill requests Ask patient to call pharmacy to ensure rx is ready before heading there.

## 2022-12-23 NOTE — Telephone Encounter (Signed)
I did not know it had been sent in yet DR Birdie Riddle normally sends me the message back it was approved . I am calling pt now

## 2022-12-23 NOTE — Telephone Encounter (Signed)
Was this patient called and made aware that her rx was sent in?

## 2023-01-05 ENCOUNTER — Ambulatory Visit: Payer: Medicare PPO | Admitting: Family Medicine

## 2023-01-05 ENCOUNTER — Encounter: Payer: Self-pay | Admitting: Family Medicine

## 2023-01-05 VITALS — BP 136/62 | HR 86 | Ht 64.0 in | Wt 216.0 lb

## 2023-01-05 DIAGNOSIS — I10 Essential (primary) hypertension: Secondary | ICD-10-CM

## 2023-01-05 DIAGNOSIS — E669 Obesity, unspecified: Secondary | ICD-10-CM

## 2023-01-05 LAB — CBC WITH DIFFERENTIAL/PLATELET
Basophils Absolute: 0 10*3/uL (ref 0.0–0.1)
Basophils Relative: 0.2 % (ref 0.0–3.0)
Eosinophils Absolute: 0.1 10*3/uL (ref 0.0–0.7)
Eosinophils Relative: 0.9 % (ref 0.0–5.0)
HCT: 43.7 % (ref 36.0–46.0)
Hemoglobin: 14.7 g/dL (ref 12.0–15.0)
Lymphocytes Relative: 18.6 % (ref 12.0–46.0)
Lymphs Abs: 1.5 10*3/uL (ref 0.7–4.0)
MCHC: 33.8 g/dL (ref 30.0–36.0)
MCV: 92.9 fl (ref 78.0–100.0)
Monocytes Absolute: 0.8 10*3/uL (ref 0.1–1.0)
Monocytes Relative: 9.8 % (ref 3.0–12.0)
Neutro Abs: 5.6 10*3/uL (ref 1.4–7.7)
Neutrophils Relative %: 70.5 % (ref 43.0–77.0)
Platelets: 367 10*3/uL (ref 150.0–400.0)
RBC: 4.7 Mil/uL (ref 3.87–5.11)
RDW: 13.9 % (ref 11.5–15.5)
WBC: 7.9 10*3/uL (ref 4.0–10.5)

## 2023-01-05 LAB — BASIC METABOLIC PANEL
BUN: 15 mg/dL (ref 6–23)
CO2: 29 mEq/L (ref 19–32)
Calcium: 9.3 mg/dL (ref 8.4–10.5)
Chloride: 101 mEq/L (ref 96–112)
Creatinine, Ser: 0.65 mg/dL (ref 0.40–1.20)
GFR: 91.94 mL/min (ref >60.00)
Glucose, Bld: 89 mg/dL (ref 70–99)
Potassium: 4 mEq/L (ref 3.5–5.1)
Sodium: 139 mEq/L (ref 135–145)

## 2023-01-05 LAB — LIPID PANEL
Cholesterol: 137 mg/dL (ref 0–200)
HDL: 77.5 mg/dL (ref >39.00)
LDL Cholesterol: 41 mg/dL (ref 0–99)
NonHDL: 59.52
Total CHOL/HDL Ratio: 2
Triglycerides: 92 mg/dL (ref 0.0–149.0)
VLDL: 18.4 mg/dL (ref 0.0–40.0)

## 2023-01-05 LAB — HEPATIC FUNCTION PANEL
ALT: 14 U/L (ref 0–35)
AST: 16 U/L (ref 0–37)
Albumin: 4.1 g/dL (ref 3.5–5.2)
Alkaline Phosphatase: 70 U/L (ref 39–117)
Bilirubin, Direct: 0.1 mg/dL (ref 0.0–0.3)
Total Bilirubin: 0.3 mg/dL (ref 0.2–1.2)
Total Protein: 6.9 g/dL (ref 6.0–8.3)

## 2023-01-05 LAB — TSH: TSH: 1.48 u[IU]/mL (ref 0.35–5.50)

## 2023-01-05 MED ORDER — ZOLPIDEM TARTRATE 10 MG PO TABS: 10.0000 mg | ORAL_TABLET | Freq: Every evening | ORAL | 3 refills | Status: DC | PRN

## 2023-01-05 MED ORDER — OLMESARTAN MEDOXOMIL-HCTZ 40-12.5 MG PO TABS
1.0000 | ORAL_TABLET | Freq: Every day | ORAL | 0 refills | Status: DC
Start: 2023-01-05 — End: 2023-07-20

## 2023-01-05 MED ORDER — LAMOTRIGINE 150 MG PO TABS: 150.0000 mg | ORAL_TABLET | Freq: Every day | ORAL | 1 refills | Status: DC

## 2023-01-05 NOTE — Assessment & Plan Note (Signed)
Deteriorated.  Pt has gained 6 lbs since last visit.  Currently not following any particular diet and physical activity is limited due to upcoming hip surgery.  Check labs to risk stratify.  Will follow.

## 2023-01-05 NOTE — Assessment & Plan Note (Signed)
Chronic problem.  Currently doing well on Amlodipine '5mg'$  daily and Olmesartan HCTZ 40/12.'5mg'$  daily.  Asymptomatic at this tim.  Check labs due to ARB and diuretic but no anticipated med changes.  Will follow.

## 2023-01-05 NOTE — Progress Notes (Signed)
   Subjective:    Patient ID: Diana Vincent, female    DOB: 22-May-1956, 67 y.o.   MRN: [id number]  HPI HTN- chronic problem, on Amlodipine '5mg'$  daily, Olmesartan HCTZ 40/12.'5mg'$  daily w/ adequate control.  No CP, SOB, HA's visual changes, edema.  Obesity- pt has gained 6 lbs.  No abd pain, N/V.  Limited physical activity due to hip pain and upcoming surgery.  Not following any particular diet.     Review of Systems For ROS see HPI     Objective:   Physical Exam Vitals reviewed.  Constitutional:      General: She is not in acute distress.    Appearance: Normal appearance. She is well-developed. She is obese. She is not ill-appearing.  HENT:     Head: Normocephalic and atraumatic.  Eyes:     Conjunctiva/sclera: Conjunctivae normal.     Pupils: Pupils are equal, round, and reactive to light.  Neck:     Thyroid: No thyromegaly.  Cardiovascular:     Rate and Rhythm: Normal rate and regular rhythm.     Heart sounds: Normal heart sounds. No murmur heard. Pulmonary:     Effort: Pulmonary effort is normal. No respiratory distress.     Breath sounds: Normal breath sounds.  Abdominal:     General: There is no distension.     Palpations: Abdomen is soft.     Tenderness: There is no abdominal tenderness.  Musculoskeletal:     Cervical back: Normal range of motion and neck supple.  Lymphadenopathy:     Cervical: No cervical adenopathy.  Skin:    General: Skin is warm and dry.  Neurological:     General: No focal deficit present.     Mental Status: She is alert and oriented to person, place, and time.  Psychiatric:        Mood and Affect: Mood normal.        Behavior: Behavior normal.        Thought Content: Thought content normal.           Assessment & Plan:

## 2023-01-05 NOTE — Patient Instructions (Signed)
Schedule your complete physical in 6 months We'll notify you of your lab results and make any changes if needed Continue to work on healthy diet and regular physical activity as you are able- you can do it! Call with any questions or concerns Stay Safe!  Stay Healthy!! Happy New Year!!

## 2023-01-06 ENCOUNTER — Telehealth: Payer: Self-pay

## 2023-01-06 NOTE — Telephone Encounter (Signed)
-----  Message from Midge Minium, MD sent at 01/06/2023  7:51 AM EST ----- Labs look great!  No changes at this time

## 2023-01-06 NOTE — Telephone Encounter (Signed)
Informed pt of lab results  

## 2023-01-29 ENCOUNTER — Telehealth: Payer: Self-pay

## 2023-01-29 ENCOUNTER — Ambulatory Visit: Payer: Medicare PPO

## 2023-01-29 NOTE — Progress Notes (Signed)
I connected with  Diana Vincent on 01/29/23 by a  phone  enabled telemedicine application and verified that I am speaking with the correct person using two identifiers.  Patient Location: Home  Provider Location: Office/Clinic [place]  Dr Annye Asa  Visit for Medicare AWV :30 minutes   I discussed the limitations of evaluation and management by telemedicine. The patient expressed understanding and agreed to proceed.   Breck Coons

## 2023-01-29 NOTE — Telephone Encounter (Signed)
I connected with  Cassiah Harberts on 01/29/23 by a audio enabled telemedicine application and verified that I am speaking with the correct person using two identifiers.  Patient Location: Home  Provider Location: Home Office  I discussed the limitations of evaluation and management by telemedicine. The patient expressed understanding and agreed to proceed.    Breck Coons

## 2023-03-06 ENCOUNTER — Encounter: Payer: Self-pay | Admitting: Family Medicine

## 2023-03-06 ENCOUNTER — Encounter: Payer: Medicare PPO | Admitting: Family Medicine

## 2023-03-20 NOTE — Addendum Note (Signed)
Addended by: Verlan Friends on: 03/20/2023 03:23 PM   Modules accepted: Level of Service

## 2023-03-20 NOTE — Progress Notes (Signed)
Reviewed the documentation for this DOS. Documentation was incomplete. CheckIn/Appt was canceled.   AWV has to be redone.    

## 2023-04-06 ENCOUNTER — Telehealth: Payer: Self-pay

## 2023-04-06 NOTE — Telephone Encounter (Signed)
Pt is having surgery Left hip replacement on 04/28/23. Surgical clearance form placed in Dr Beverely Low to be signed . Pt has an apt w/ Dr Beverely Low on 04/07/23

## 2023-04-07 ENCOUNTER — Ambulatory Visit (INDEPENDENT_AMBULATORY_CARE_PROVIDER_SITE_OTHER): Payer: Medicare PPO | Admitting: Family Medicine

## 2023-04-07 ENCOUNTER — Encounter: Payer: Self-pay | Admitting: Family Medicine

## 2023-04-07 VITALS — BP 120/68 | HR 71 | Temp 97.8°F | Resp 16 | Ht 64.0 in | Wt 216.4 lb

## 2023-04-07 DIAGNOSIS — E538 Deficiency of other specified B group vitamins: Secondary | ICD-10-CM | POA: Diagnosis not present

## 2023-04-07 DIAGNOSIS — I10 Essential (primary) hypertension: Secondary | ICD-10-CM | POA: Diagnosis not present

## 2023-04-07 DIAGNOSIS — Z0000 Encounter for general adult medical examination without abnormal findings: Secondary | ICD-10-CM

## 2023-04-07 LAB — HEPATIC FUNCTION PANEL
ALT: 15 U/L (ref 0–35)
AST: 16 U/L (ref 0–37)
Albumin: 4.1 g/dL (ref 3.5–5.2)
Alkaline Phosphatase: 76 U/L (ref 39–117)
Bilirubin, Direct: 0.1 mg/dL (ref 0.0–0.3)
Total Bilirubin: 0.4 mg/dL (ref 0.2–1.2)
Total Protein: 6.4 g/dL (ref 6.0–8.3)

## 2023-04-07 LAB — CBC WITH DIFFERENTIAL/PLATELET
Basophils Absolute: 0 10*3/uL (ref 0.0–0.1)
Basophils Relative: 0.4 % (ref 0.0–3.0)
Eosinophils Absolute: 0.1 10*3/uL (ref 0.0–0.7)
Eosinophils Relative: 1.2 % (ref 0.0–5.0)
HCT: 43.9 % (ref 36.0–46.0)
Hemoglobin: 14.6 g/dL (ref 12.0–15.0)
Lymphocytes Relative: 18 % (ref 12.0–46.0)
Lymphs Abs: 1.4 10*3/uL (ref 0.7–4.0)
MCHC: 33.4 g/dL (ref 30.0–36.0)
MCV: 92.7 fl (ref 78.0–100.0)
Monocytes Absolute: 0.8 10*3/uL (ref 0.1–1.0)
Monocytes Relative: 10.4 % (ref 3.0–12.0)
Neutro Abs: 5.5 10*3/uL (ref 1.4–7.7)
Neutrophils Relative %: 70 % (ref 43.0–77.0)
Platelets: 425 10*3/uL — ABNORMAL HIGH (ref 150.0–400.0)
RBC: 4.73 Mil/uL (ref 3.87–5.11)
RDW: 14.1 % (ref 11.5–15.5)
WBC: 7.9 10*3/uL (ref 4.0–10.5)

## 2023-04-07 LAB — LIPID PANEL
Cholesterol: 151 mg/dL (ref 0–200)
HDL: 64.6 mg/dL (ref >39.00)
LDL Cholesterol: 59 mg/dL (ref 0–99)
NonHDL: 86.3
Total CHOL/HDL Ratio: 2
Triglycerides: 138 mg/dL (ref 0.0–149.0)
VLDL: 27.6 mg/dL (ref 0.0–40.0)

## 2023-04-07 LAB — BASIC METABOLIC PANEL
BUN: 13 mg/dL (ref 6–23)
CO2: 29 mEq/L (ref 19–32)
Calcium: 9 mg/dL (ref 8.4–10.5)
Chloride: 100 mEq/L (ref 96–112)
Creatinine, Ser: 0.64 mg/dL (ref 0.40–1.20)
GFR: 92.12 mL/min (ref >60.00)
Glucose, Bld: 78 mg/dL (ref 70–99)
Potassium: 4 mEq/L (ref 3.5–5.1)
Sodium: 138 mEq/L (ref 135–145)

## 2023-04-07 LAB — TSH: TSH: 1.53 u[IU]/mL (ref 0.35–5.50)

## 2023-04-07 LAB — B12 AND FOLATE PANEL
Folate: 9.9 ng/mL (ref >5.9)
Vitamin B-12: 275 pg/mL (ref 211–911)

## 2023-04-07 NOTE — Assessment & Plan Note (Signed)
Excellent control today on Olmesartan HCTZ 40/12.5mg  daily.  Currently asymptomatic.  EKG WNL and pt cleared for surgery.

## 2023-04-07 NOTE — Assessment & Plan Note (Signed)
Check labs and replete prn. 

## 2023-04-07 NOTE — Assessment & Plan Note (Signed)
Pt's PE WNL w/ exception of BMI.  UTD on mammo, colonoscopy, immunizations.  Check labs.  Anticipatory guidance provided.  

## 2023-04-07 NOTE — Patient Instructions (Signed)
Follow up in 6 months to recheck BP and cholesterol We'll notify you of your lab results and make any changes if needed Keep up the good work on healthy diet and regular exercise- you can do it! Call with any questions or concerns Stay Safe!  Stay Healthy! GOOD LUCK WITH SURGERY!!

## 2023-04-07 NOTE — Progress Notes (Signed)
   Subjective:    Patient ID: Diana Vincent, female    DOB: 06/14/1956, 67 y.o.   MRN: [id number]  HPI CPE- colonoscopy is coming due later this year.  UTD on mammo, vaccines  Patient Care Team    Relationship Specialty Notifications Start End  Sheliah Hatch, MD PCP - General Family Medicine  04/02/16   Carrington Clamp, MD Consulting Physician Obstetrics and Gynecology  06/10/17   Beverley Fiedler, MD Consulting Physician Gastroenterology  09/26/20      Health Maintenance  Topic Date Due   Zoster Vaccines- Shingrix (1 of 2) 07/07/2023 (Originally 11/19/2006)   COLONOSCOPY (Pts 45-29yrs Insurance coverage will need to be confirmed)  05/24/2023   INFLUENZA VACCINE  07/16/2023   MAMMOGRAM  12/11/2023   Medicare Annual Wellness (AWV)  01/30/2024   DTaP/Tdap/Td (3 - Td or Tdap) 11/02/2027   Pneumonia Vaccine 60+ Years old  Completed   DEXA SCAN  Completed   COVID-19 Vaccine  Completed   HPV VACCINES  Aged Out   Hepatitis C Screening  Discontinued      Review of Systems Patient reports no vision/ hearing changes, adenopathy,fever, weight change,  persistant/recurrent hoarseness , swallowing issues, chest pain, palpitations, edema, persistant/recurrent cough, hemoptysis, dyspnea (rest/exertional/paroxysmal nocturnal), gastrointestinal bleeding (melena, rectal bleeding), abdominal pain, significant heartburn, bowel changes, GU symptoms (dysuria, hematuria, incontinence), Gyn symptoms (abnormal  bleeding, pain),  syncope, focal weakness, memory loss, numbness & tingling, skin/hair/nail changes, abnormal bruising or bleeding, anxiety, or depression.     Objective:   Physical Exam General Appearance:    Alert, cooperative, no distress, appears stated age  Head:    Normocephalic, without obvious abnormality, atraumatic  Eyes:    PERRL, conjunctiva/corneas clear, EOM's intact both eyes  Ears:    Normal TM's and external ear canals, both ears  Nose:   Nares normal, septum midline, mucosa  normal, no drainage    or sinus tenderness  Throat:   Lips, mucosa, and tongue normal; teeth and gums normal  Neck:   Supple, symmetrical, trachea midline, no adenopathy;    Thyroid: no enlargement/tenderness/nodules  Back:     Symmetric, no curvature, ROM normal, no CVA tenderness  Lungs:     Clear to auscultation bilaterally, respirations unlabored  Chest Wall:    No tenderness or deformity   Heart:    Regular rate and rhythm, S1 and S2 normal, no murmur, rub   or gallop  Breast Exam:    Deferred to mammo  Abdomen:     Soft, non-tender, bowel sounds active all four quadrants,    no masses, no organomegaly  Genitalia:    Deferred to GYN  Rectal:    Extremities:   Extremities normal, atraumatic, no cyanosis or edema  Pulses:   2+ and symmetric all extremities  Skin:   Skin color, texture, turgor normal, no rashes or lesions  Lymph nodes:   Cervical, supraclavicular, and axillary nodes normal  Neurologic:   CNII-XII intact, normal strength, sensation and reflexes    throughout          Assessment & Plan:

## 2023-04-08 ENCOUNTER — Telehealth: Payer: Self-pay

## 2023-04-08 NOTE — Telephone Encounter (Signed)
Form completed and returned to [facility] 

## 2023-04-08 NOTE — Telephone Encounter (Signed)
-----   Message from Sheliah Hatch, MD sent at 04/08/2023  7:30 AM EDT ----- Labs look great!  No changes at this time.  Ok to proceed w/ surgery

## 2023-04-08 NOTE — Telephone Encounter (Signed)
Form faxed and placed in scan CBC and CMP has been sent as well

## 2023-04-08 NOTE — Telephone Encounter (Signed)
Pt aware of lab results 

## 2023-04-10 ENCOUNTER — Encounter: Payer: Medicare PPO | Admitting: Family Medicine

## 2023-04-15 HISTORY — PX: TOTAL HIP ARTHROPLASTY: SHX124

## 2023-04-28 DIAGNOSIS — M1612 Unilateral primary osteoarthritis, left hip: Secondary | ICD-10-CM | POA: Diagnosis not present

## 2023-05-25 DIAGNOSIS — H26492 Other secondary cataract, left eye: Secondary | ICD-10-CM | POA: Diagnosis not present

## 2023-05-25 DIAGNOSIS — H43823 Vitreomacular adhesion, bilateral: Secondary | ICD-10-CM | POA: Diagnosis not present

## 2023-05-25 DIAGNOSIS — H353114 Nonexudative age-related macular degeneration, right eye, advanced atrophic with subfoveal involvement: Secondary | ICD-10-CM | POA: Diagnosis not present

## 2023-05-25 DIAGNOSIS — H353123 Nonexudative age-related macular degeneration, left eye, advanced atrophic without subfoveal involvement: Secondary | ICD-10-CM | POA: Diagnosis not present

## 2023-06-03 DIAGNOSIS — Z5189 Encounter for other specified aftercare: Secondary | ICD-10-CM | POA: Diagnosis not present

## 2023-06-08 DIAGNOSIS — H26492 Other secondary cataract, left eye: Secondary | ICD-10-CM | POA: Diagnosis not present

## 2023-06-08 DIAGNOSIS — H353123 Nonexudative age-related macular degeneration, left eye, advanced atrophic without subfoveal involvement: Secondary | ICD-10-CM | POA: Diagnosis not present

## 2023-06-08 DIAGNOSIS — H353114 Nonexudative age-related macular degeneration, right eye, advanced atrophic with subfoveal involvement: Secondary | ICD-10-CM | POA: Diagnosis not present

## 2023-06-08 DIAGNOSIS — H43823 Vitreomacular adhesion, bilateral: Secondary | ICD-10-CM | POA: Diagnosis not present

## 2023-06-15 ENCOUNTER — Other Ambulatory Visit: Payer: Self-pay

## 2023-06-15 MED ORDER — ZOLPIDEM TARTRATE 10 MG PO TABS: 10.0000 mg | ORAL_TABLET | Freq: Every evening | ORAL | 3 refills | Status: DC | PRN

## 2023-06-15 NOTE — Telephone Encounter (Signed)
Patient is requesting a refill of the following medications: Requested Prescriptions   Pending Prescriptions Disp Refills   zolpidem (AMBIEN) 10 MG tablet 30 tablet 3    Sig: Take 1 tablet (10 mg total) by mouth at bedtime as needed for sleep.    Date of patient request: 06/15/23 Last office visit: 04/07/23 Date of last refill: 01/05/23 Last refill amount: 15* pharmacy was short on supply, pt needs 30 this time  Follow up time period per chart: 6 months

## 2023-07-20 ENCOUNTER — Other Ambulatory Visit: Payer: Self-pay

## 2023-07-20 DIAGNOSIS — I10 Essential (primary) hypertension: Secondary | ICD-10-CM

## 2023-07-20 DIAGNOSIS — H353114 Nonexudative age-related macular degeneration, right eye, advanced atrophic with subfoveal involvement: Secondary | ICD-10-CM | POA: Diagnosis not present

## 2023-07-20 DIAGNOSIS — H353123 Nonexudative age-related macular degeneration, left eye, advanced atrophic without subfoveal involvement: Secondary | ICD-10-CM | POA: Diagnosis not present

## 2023-07-20 DIAGNOSIS — H26492 Other secondary cataract, left eye: Secondary | ICD-10-CM | POA: Diagnosis not present

## 2023-07-20 DIAGNOSIS — H43823 Vitreomacular adhesion, bilateral: Secondary | ICD-10-CM | POA: Diagnosis not present

## 2023-07-20 MED ORDER — OLMESARTAN MEDOXOMIL-HCTZ 40-12.5 MG PO TABS
1.0000 | ORAL_TABLET | Freq: Every day | ORAL | 1 refills | Status: DC
Start: 2023-07-20 — End: 2024-01-18

## 2023-07-22 ENCOUNTER — Ambulatory Visit: Payer: Medicare PPO | Admitting: Podiatry

## 2023-07-22 ENCOUNTER — Encounter: Payer: Self-pay | Admitting: Podiatry

## 2023-07-22 DIAGNOSIS — M778 Other enthesopathies, not elsewhere classified: Secondary | ICD-10-CM

## 2023-07-22 MED ORDER — TRIAMCINOLONE ACETONIDE 10 MG/ML IJ SUSP
10.0000 mg | Freq: Once | INTRAMUSCULAR | Status: AC
Start: 2023-07-22 — End: 2023-07-22
  Administered 2023-07-22: 10 mg via INTRA_ARTICULAR

## 2023-07-22 NOTE — Progress Notes (Signed)
Subjective:   Patient ID: Diana Vincent, female   DOB: 67 y.o.   MRN: [id number]   HPI Patient presents stating that the top of her right foot has been really sore she is needed to do this for couple months but she had a hip replacement   ROS      Objective:  Physical Exam  Neurovascular status intact inflammation around the right dorsal tendon complex fluid buildup pain     Assessment:  Extensor tendinitis right with inflammation     Plan:  Sterile prep injected the extensor complex 3 mg dexamethasone Kenalog 5 mg Xylocaine reappoint as needed

## 2023-07-30 ENCOUNTER — Ambulatory Visit (INDEPENDENT_AMBULATORY_CARE_PROVIDER_SITE_OTHER): Payer: Medicare PPO | Admitting: *Deleted

## 2023-07-30 DIAGNOSIS — Z1211 Encounter for screening for malignant neoplasm of colon: Secondary | ICD-10-CM

## 2023-07-30 DIAGNOSIS — Z0000 Encounter for general adult medical examination without abnormal findings: Secondary | ICD-10-CM

## 2023-07-30 NOTE — Patient Instructions (Signed)
Diana Vincent , Thank you for taking time to come for your Medicare Wellness Visit. I appreciate your ongoing commitment to your health goals. Please review the following plan we discussed and let me know if I can assist you in the future.   Screening recommendations/referrals: Colonoscopy: Education provided Mammogram: Education provided Bone Density: up to date Recommended yearly ophthalmology/optometry visit for glaucoma screening and checkup Recommended yearly dental visit for hygiene and checkup  Vaccinations: Influenza vaccine: up to date Pneumococcal vaccine: Education provided Tdap vaccine: up to date Shingles vaccine: Education provided    Advanced directives: Education provided    Preventive Care 65 Years and Older, Female Preventive care refers to lifestyle choices and visits with your health care provider that can promote health and wellness. What does preventive care include? A yearly physical exam. This is also called an annual well check. Dental exams once or twice a year. Routine eye exams. Ask your health care provider how often you should have your eyes checked. Personal lifestyle choices, including: Daily care of your teeth and gums. Regular physical activity. Eating a healthy diet. Avoiding tobacco and drug use. Limiting alcohol use. Practicing safe sex. Taking low-dose aspirin every day. Taking vitamin and mineral supplements as recommended by your health care provider. What happens during an annual well check? The services and screenings done by your health care provider during your annual well check will depend on your age, overall health, lifestyle risk factors, and family history of disease. Counseling  Your health care provider may ask you questions about your: Alcohol use. Tobacco use. Drug use. Emotional well-being. Home and relationship well-being. Sexual activity. Eating habits. History of falls. Memory and ability to understand  (cognition). Work and work Astronomer. Reproductive health. Screening  You may have the following tests or measurements: Height, weight, and BMI. Blood pressure. Lipid and cholesterol levels. These may be checked every 5 years, or more frequently if you are over 71 years old. Skin check. Lung cancer screening. You may have this screening every year starting at age 71 if you have a 30-pack-year history of smoking and currently smoke or have quit within the past 15 years. Fecal occult blood test (FOBT) of the stool. You may have this test every year starting at age 51. Flexible sigmoidoscopy or colonoscopy. You may have a sigmoidoscopy every 5 years or a colonoscopy every 10 years starting at age 40. Hepatitis C blood test. Hepatitis B blood test. Sexually transmitted disease (STD) testing. Diabetes screening. This is done by checking your blood sugar (glucose) after you have not eaten for a while (fasting). You may have this done every 1-3 years. Bone density scan. This is done to screen for osteoporosis. You may have this done starting at age 49. Mammogram. This may be done every 1-2 years. Talk to your health care provider about how often you should have regular mammograms. Talk with your health care provider about your test results, treatment options, and if necessary, the need for more tests. Vaccines  Your health care provider may recommend certain vaccines, such as: Influenza vaccine. This is recommended every year. Tetanus, diphtheria, and acellular pertussis (Tdap, Td) vaccine. You may need a Td booster every 10 years. Zoster vaccine. You may need this after age 16. Pneumococcal 13-valent conjugate (PCV13) vaccine. One dose is recommended after age 49. Pneumococcal polysaccharide (PPSV23) vaccine. One dose is recommended after age 29. Talk to your health care provider about which screenings and vaccines you need and how often you need them.  This information is not intended to  replace advice given to you by your health care provider. Make sure you discuss any questions you have with your health care provider. Document Released: 12/28/2015 Document Revised: 08/20/2016 Document Reviewed: 10/02/2015 Elsevier Interactive Patient Education  2017 [employer].  Fall Prevention in the Home Falls can cause injuries. They can happen to people of all ages. There are many things you can do to make your home safe and to help prevent falls. What can I do on the outside of my home? Regularly fix the edges of walkways and driveways and fix any cracks. Remove anything that might make you trip as you walk through a door, such as a raised step or threshold. Trim any bushes or trees on the path to your home. Use bright outdoor lighting. Clear any walking paths of anything that might make someone trip, such as rocks or tools. Regularly check to see if handrails are loose or broken. Make sure that both sides of any steps have handrails. Any raised decks and porches should have guardrails on the edges. Have any leaves, snow, or ice cleared regularly. Use sand or salt on walking paths during winter. Clean up any spills in your garage right away. This includes oil or grease spills. What can I do in the bathroom? Use night lights. Install grab bars by the toilet and in the tub and shower. Do not use towel bars as grab bars. Use non-skid mats or decals in the tub or shower. If you need to sit down in the shower, use a plastic, non-slip stool. Keep the floor dry. Clean up any water that spills on the floor as soon as it happens. Remove soap buildup in the tub or shower regularly. Attach bath mats securely with double-sided non-slip rug tape. Do not have throw rugs and other things on the floor that can make you trip. What can I do in the bedroom? Use night lights. Make sure that you have a light by your bed that is easy to reach. Do not use any sheets or blankets that are too big for  your bed. They should not hang down onto the floor. Have a firm chair that has side arms. You can use this for support while you get dressed. Do not have throw rugs and other things on the floor that can make you trip. What can I do in the kitchen? Clean up any spills right away. Avoid walking on wet floors. Keep items that you use a lot in easy-to-reach places. If you need to reach something above you, use a strong step stool that has a grab bar. Keep electrical cords out of the way. Do not use floor polish or wax that makes floors slippery. If you must use wax, use non-skid floor wax. Do not have throw rugs and other things on the floor that can make you trip. What can I do with my stairs? Do not leave any items on the stairs. Make sure that there are handrails on both sides of the stairs and use them. Fix handrails that are broken or loose. Make sure that handrails are as long as the stairways. Check any carpeting to make sure that it is firmly attached to the stairs. Fix any carpet that is loose or worn. Avoid having throw rugs at the top or bottom of the stairs. If you do have throw rugs, attach them to the floor with carpet tape. Make sure that you have a light switch at the  top of the stairs and the bottom of the stairs. If you do not have them, ask someone to add them for you. What else can I do to help prevent falls? Wear shoes that: Do not have high heels. Have rubber bottoms. Are comfortable and fit you well. Are closed at the toe. Do not wear sandals. If you use a stepladder: Make sure that it is fully opened. Do not climb a closed stepladder. Make sure that both sides of the stepladder are locked into place. Ask someone to hold it for you, if possible. Clearly mark and make sure that you can see: Any grab bars or handrails. First and last steps. Where the edge of each step is. Use tools that help you move around (mobility aids) if they are needed. These  include: Canes. Walkers. Scooters. Crutches. Turn on the lights when you go into a dark area. Replace any light bulbs as soon as they burn out. Set up your furniture so you have a clear path. Avoid moving your furniture around. If any of your floors are uneven, fix them. If there are any pets around you, be aware of where they are. Review your medicines with your doctor. Some medicines can make you feel dizzy. This can increase your chance of falling. Ask your doctor what other things that you can do to help prevent falls. This information is not intended to replace advice given to you by your health care provider. Make sure you discuss any questions you have with your health care provider. Document Released: 09/27/2009 Document Revised: 05/08/2016 Document Reviewed: 01/05/2015 Elsevier Interactive Patient Education  2017 [employer].

## 2023-07-30 NOTE — Progress Notes (Signed)
Subjective:   Diana Vincent is a 66 y.o. female who presents for an Initial Medicare Annual Wellness Visit.  Visit Complete: Virtual  I connected with  Mersaydes Obst on 07/30/23 by a audio enabled telemedicine application and verified that I am speaking with the correct person using two identifiers.  Patient Location: Home  Provider Location: Home Office  I discussed the limitations of evaluation and management by telemedicine. The patient expressed understanding and agreed to proceed.  Vital Signs: Unable to obtain new vitals due to this being a telehealth visit.   Review of Systems     Cardiac Risk Factors include: advanced age (>58men, >28 women);hypertension     Objective:    There were no vitals filed for this visit. There is no height or weight on file to calculate BMI.     07/30/2023   10:01 AM 01/29/2023   10:09 AM 09/24/2017    2:04 PM 07/07/2012    2:00 PM 07/01/2012   11:40 AM  Advanced Directives  Does Patient Have a Medical Advance Directive? No Yes Yes Patient does not have advance directive Patient does not have advance directive  Type of Advance Directive  Living will Living will;Healthcare Power of Attorney    Would patient like information on creating a medical advance directive? No - Patient declined      Pre-existing out of facility DNR order (yellow form or pink MOST form)    No     Current Medications (verified) Outpatient Encounter Medications as of 07/30/2023  Medication Sig   amLODipine (NORVASC) 5 MG tablet TAKE 1 TABLET(5 MG) BY MOUTH DAILY   Biotin 5 MG CAPS Take by mouth daily.   Cholecalciferol (D3 ADULT PO) Take by mouth daily.   lamoTRIgine (LAMICTAL) 150 MG tablet Take 1 tablet (150 mg total) by mouth daily.   Loratadine (CLARITIN PO) Take by mouth daily.   MYRBETRIQ 50 MG TB24 tablet Take 50 mg by mouth daily.   olmesartan-hydrochlorothiazide (BENICAR HCT) 40-12.5 MG tablet Take 1 tablet by mouth daily.   zolpidem (AMBIEN) 10 MG tablet Take  1 tablet (10 mg total) by mouth at bedtime as needed for sleep.   Facility-Administered Encounter Medications as of 07/30/2023  Medication   triamcinolone acetonide (KENALOG) 10 MG/ML injection 10 mg    Allergies (verified) Patient has no known allergies.   History: Past Medical History:  Diagnosis Date   Allergic rhinitis    Anxiety    Arthritis    Cataract    Depression    Head injury    hairline fx from fall out of swing-    Head injury without skull fracture    no LOC   Hypertension    Past Surgical History:  Procedure Laterality Date   ABDOMINAL HYSTERECTOMY  1990s   ANTERIOR AND POSTERIOR REPAIR  07/07/2012   Procedure: ANTERIOR (CYSTOCELE) AND POSTERIOR REPAIR (RECTOCELE);  Surgeon: Loney Laurence, MD;  Location: [facility];  Service: Gynecology;  Laterality: N/A;   BLADDER SUSPENSION  07/07/2012   Procedure: TRANSVAGINAL TAPE (TVT) PROCEDURE;  Surgeon: Loney Laurence, MD;  Location: [facility];  Service: Gynecology;  Laterality: N/A;   CATARACT EXTRACTION Right    [place]at/[place]   CATARACT EXTRACTION Left    [place]at   CHOLECYSTECTOMY  2009   COLONOSCOPY     CYSTOSCOPY  07/07/2012   Procedure: CYSTOSCOPY;  Surgeon: Loney Laurence, MD;  Location: [facility];  Service: Gynecology;;   POLYPECTOMY     TOTAL HIP ARTHROPLASTY  Right    Family History  Problem Relation Age of Onset   Leukemia Mother    Hypertension Mother    Cancer Father        skin   Hypertension Father    Colon cancer Neg Hx    Colon polyps Neg Hx    Stomach cancer Neg Hx    Esophageal cancer Neg Hx    Social History   Socioeconomic History   Marital status: Married    Spouse name: Not on file   Number of children: 2   Years of education: Not on file   Highest education level: Not on file  Occupational History   Not on file  Tobacco Use   Smoking status: Never   Smokeless tobacco: Never  Vaping Use   Vaping status: Never Used  Substance and Sexual Activity   Alcohol use: Not  Currently   Drug use: No   Sexual activity: Not Currently  Other Topics Concern   Not on file  Social History Narrative   Not on file   Social Determinants of Health   Financial Resource Strain: Low Risk  (07/30/2023)   Overall Financial Resource Strain (CARDIA)    Difficulty of Paying Living Expenses: Not hard at all  Food Insecurity: No Food Insecurity (07/30/2023)   Hunger Vital Sign    Worried About Running Out of Food in the Last Year: Never true    Ran Out of Food in the Last Year: Never true  Transportation Needs: No Transportation Needs (07/30/2023)   PRAPARE - Administrator, Civil Service (Medical): No    Lack of Transportation (Non-Medical): No  Physical Activity: Insufficiently Active (07/30/2023)   Exercise Vital Sign    Days of Exercise per Week: 2 days    Minutes of Exercise per Session: 20 min  Stress: No Stress Concern Present (07/30/2023)   [employer] of Occupational Health - Occupational Stress Questionnaire    Feeling of Stress : Not at all  Social Connections: Moderately Isolated (07/30/2023)   Social Connection and Isolation Panel [NHANES]    Frequency of Communication with Friends and Family: More than three times a week    Frequency of Social Gatherings with Friends and Family: Three times a week    Attends Religious Services: Never    Active Member of Clubs or Organizations: No    Attends Engineer, structural: Never    Marital Status: Married    Tobacco Counseling Counseling given: Not Answered   Clinical Intake:  Pre-visit preparation completed: Yes  Pain : No/denies pain     Diabetes: No  How often do you need to have someone help you when you read instructions, pamphlets, or other written materials from your doctor or pharmacy?: 1 - Never  Interpreter Needed?: No  Information entered by :: Remi Haggard LPN   Activities of Daily Living    07/30/2023    9:36 AM 01/29/2023   10:11 AM  In your present state of  health, do you have any difficulty performing the following activities:  Hearing? 1 1  Comment  pt ues hearing aides  Vision? 0 0  Difficulty concentrating or making decisions? 0 0  Walking or climbing stairs? 1 1  Comment hip replacement knee issue left hip pain  Dressing or bathing? 0 0  Doing errands, shopping? 0 0  Preparing Food and eating ? N Y  Using the Toilet? N Y  In the past six months, have you accidently leaked  urine? N N  Do you have problems with loss of bowel control? N N  Managing your Medications? N Y  Managing your Finances? N Y  Housekeeping or managing your Housekeeping? N Y    Patient Care Team: Sheliah Hatch, MD as PCP - General (Family Medicine) Carrington Clamp, MD as Consulting Physician (Obstetrics and Gynecology) Rhea Belton, Carie Caddy, MD as Consulting Physician (Gastroenterology)  Indicate any recent Medical Services you may have received from other than [facility] providers in the past year (date may be approximate).     Assessment:   This is a routine wellness examination for Diana Vincent.  Hearing/Vision screen Hearing Screening - Comments:: Yes trouble hearing Vision Screening - Comments:: Up to date Has macular Degeneration  Gets injections  Dietary issues and exercise activities discussed:     Goals Addressed             This Visit's Progress    Patient Stated       No goals       Depression Screen    07/30/2023    9:43 AM 04/07/2023   10:49 AM 01/29/2023   10:17 AM 01/29/2023   10:16 AM 01/29/2023   10:11 AM 12/20/2021   11:05 AM 03/25/2021   10:28 AM  PHQ 2/9 Scores  PHQ - 2 Score 0 0 0 0 0 0 0  PHQ- 9 Score 0 2 1   9  0    Fall Risk    07/30/2023    9:55 AM 04/07/2023   10:49 AM 01/29/2023   10:11 AM 01/05/2023    1:01 PM 12/20/2021   11:05 AM  Fall Risk   Falls in the past year? 0 0 0 0 1  Number falls in past yr: 0 0  0 0  Injury with Fall? 0 0  0 1  Risk for fall due to :  No Fall Risks  No Fall Risks History of fall(s)  Follow  up Falls evaluation completed;Education provided;Falls prevention discussed Falls evaluation completed Falls evaluation completed Falls evaluation completed Falls evaluation completed    MEDICARE RISK AT HOME:  Medicare Risk at Home - 07/30/23 1024     Any stairs in or around the home? Yes    If so, are there any without handrails? No    Home free of loose throw rugs in walkways, pet beds, electrical cords, etc? Yes    Adequate lighting in your home to reduce risk of falls? Yes    Life alert? No    Use of a cane, walker or w/c? No    Grab bars in the bathroom? No    Shower chair or bench in shower? Yes    Elevated toilet seat or a handicapped toilet? No             TIMED UP AND GO:  Was the test performed? No    Cognitive Function:        07/30/2023    9:36 AM 01/29/2023   10:14 AM  6CIT Screen  What Year? 0 points 0 points  What month? 0 points 0 points  What time? 0 points 0 points  Count back from 20 0 points 0 points  Months in reverse 0 points 0 points  Repeat phrase 0 points 0 points  Total Score 0 points 0 points    Immunizations Immunization History  Administered Date(s) Administered   COVID-19, mRNA, vaccine(Comirnaty)12 years and older 10/03/2022   Influenza,inj,Quad PF,6+ Mos 10/26/2016, 11/01/2017, 10/08/2018, 09/05/2019, 09/26/2020  Influenza,inj,quad, With Preservative 10/15/2017   Influenza-Unspecified 10/17/2017, 10/03/2021, 10/03/2022   PFIZER(Purple Top)SARS-COV-2 Vaccination 02/10/2020, 03/03/2020, 11/14/2020   PNEUMOCOCCAL CONJUGATE-20 12/20/2021   Td 10/12/2009   Tdap 11/01/2017    TDAP status: Up to date  Flu Vaccine status: Up to date  Pneumococcal vaccine status: Up to date  Covid-19 vaccine status: Information provided on how to obtain vaccines.   Qualifies for Shingles Vaccine? Yes   Zostavax completed Yes   Shingrix Completed?: No.    Education has been provided regarding the importance of this vaccine. Patient has been  advised to call insurance company to determine out of pocket expense if they have not yet received this vaccine. Advised may also receive vaccine at local pharmacy or Health Dept. Verbalized acceptance and understanding.  Screening Tests Health Maintenance  Topic Date Due   Zoster Vaccines- Shingrix (1 of 2) Never done   COVID-19 Vaccine (5 - 2023-24 season) 02/03/2023   Colonoscopy  05/24/2023   INFLUENZA VACCINE  07/16/2023   MAMMOGRAM  12/11/2023   Medicare Annual Wellness (AWV)  07/29/2024   DTaP/Tdap/Td (3 - Td or Tdap) 11/02/2027   Pneumonia Vaccine 39+ Years old  Completed   DEXA SCAN  Completed   HPV VACCINES  Aged Out   Hepatitis C Screening  Discontinued    Health Maintenance  Health Maintenance Due  Topic Date Due   Zoster Vaccines- Shingrix (1 of 2) Never done   COVID-19 Vaccine (5 - 2023-24 season) 02/03/2023   Colonoscopy  05/24/2023   INFLUENZA VACCINE  07/16/2023    Colorectal cancer screening: Referral to GI placed  . Pt aware the office will call re: appt.  Mammogram status: Ordered  . Pt provided with contact info and advised to call to schedule appt.   Bone Density normal 2022  Lung Cancer Screening: (Low Dose CT Chest recommended if Age 55-80 years, 20 pack-year currently smoking OR have quit w/in 15years.) does not qualify.   Lung Cancer Screening Referral:   Additional Screening:  Hepatitis C Screening  never done  Vision Screening: Recommended annual ophthalmology exams for early detection of glaucoma and other disorders of the eye. Is the patient up to date with their annual eye exam?  Yes  Who is the provider or what is the name of the office in which the patient attends annual eye exams? Macular Dengeration If pt is not established with a provider, would they like to be referred to a provider to establish care? No .   Dental Screening: Recommended annual dental exams for proper oral hygiene    Community Resource Referral / Chronic Care  Management: CRR required this visit?  No   CCM required this visit?  No     Plan:     I have personally reviewed and noted the following in the patient's chart:   Medical and social history Use of alcohol, tobacco or illicit drugs  Current medications and supplements including opioid prescriptions. Patient is not currently taking opioid prescriptions. Functional ability and status Nutritional status Physical activity Advanced directives List of other physicians Hospitalizations, surgeries, and ER visits in previous 12 months Vitals Screenings to include cognitive, depression, and falls Referrals and appointments  In addition, I have reviewed and discussed with patient certain preventive protocols, quality metrics, and best practice recommendations. A written personalized care plan for preventive services as well as general preventive health recommendations were provided to patient.     Remi Haggard, LPN   1/61/0960   After Visit Summary: (  MyChart) Due to this being a telephonic visit, the after visit summary with patients personalized plan was offered to patient via MyChart   Nurse Notes:

## 2023-07-31 DIAGNOSIS — H9202 Otalgia, left ear: Secondary | ICD-10-CM | POA: Diagnosis not present

## 2023-07-31 DIAGNOSIS — R0981 Nasal congestion: Secondary | ICD-10-CM | POA: Diagnosis not present

## 2023-08-07 ENCOUNTER — Encounter: Payer: Self-pay | Admitting: Internal Medicine

## 2023-08-14 ENCOUNTER — Ambulatory Visit: Payer: Medicare PPO | Admitting: Family Medicine

## 2023-08-14 ENCOUNTER — Encounter: Payer: Self-pay | Admitting: Family Medicine

## 2023-08-14 VITALS — BP 102/84 | HR 87 | Temp 98.2°F | Resp 18 | Ht 64.0 in | Wt 220.4 lb

## 2023-08-14 DIAGNOSIS — E669 Obesity, unspecified: Secondary | ICD-10-CM | POA: Diagnosis not present

## 2023-08-14 DIAGNOSIS — F329 Major depressive disorder, single episode, unspecified: Secondary | ICD-10-CM

## 2023-08-14 DIAGNOSIS — Z6837 Body mass index (BMI) 37.0-37.9, adult: Secondary | ICD-10-CM | POA: Diagnosis not present

## 2023-08-14 MED ORDER — LAMOTRIGINE 200 MG PO TABS: 200.0000 mg | ORAL_TABLET | Freq: Every evening | ORAL | 1 refills | Status: DC

## 2023-08-14 NOTE — Patient Instructions (Signed)
Follow up in 6 weeks to recheck mood INCREASE the Lamotrigine to 200mg  nightly- new prescription sent They will call you to get scheduled with a therapist They will call you to schedule your weight loss appt Continue to work on low carb, low sugar diet and physical activity as able Call with any questions or concerns Hang in there!!!

## 2023-08-14 NOTE — Progress Notes (Unsigned)
   Subjective:    Patient ID: Diana Vincent, female    DOB: [birth year], 67 y.o.   MRN: [id number]  HPI Obesity- pt reports she has tried counting calories, intermittent fasting and nothing has helped.  Has limited mobility due to hip and knee arthritis.  Granddaughter (5) told her she was fat.    'i need some help'- would like a referral for a therapist.  Needs to have Friday availability.  Husband has stage 4 prostate cancer.  Having to deal w/ the loss of intimacy in their relationship.  Currently on Lamictal 150mg  daily.  Was previously on 200mg  daily and feels she needs to go back up.     Review of Systems For ROS see HPI     Objective:   Physical Exam Vitals reviewed.  Constitutional:      General: She is not in acute distress.    Appearance: Normal appearance. She is obese. She is not ill-appearing.  HENT:     Head: Normocephalic and atraumatic.  Cardiovascular:     Rate and Rhythm: Normal rate and regular rhythm.  Pulmonary:     Effort: Pulmonary effort is normal. No respiratory distress.     Breath sounds: No wheezing.  Skin:    General: Skin is warm and dry.  Neurological:     General: No focal deficit present.     Mental Status: She is alert and oriented to person, place, and time.  Psychiatric:        Behavior: Behavior normal.        Thought Content: Thought content normal.     Comments: tearful           Assessment & Plan:

## 2023-08-15 NOTE — Assessment & Plan Note (Signed)
Deteriorated.  Pt is tearful today and admits she needs help.  Her husband has stage 4 prostate cancer and is now impotent.  She is grieving this loss of intimacy and change in their relationship.  She feels overwhelmed and that there is very little she is able to control.  Will increase Lamictal to 200mg  daily and monitor closely for improvement.  Referral to therapist placed.  Pt expressed understanding and is in agreement w/ plan.

## 2023-08-15 NOTE — Assessment & Plan Note (Signed)
Ongoing issue for pt.  This is very stressing to her as her granddaughter recently told her she was fat but that it was 'ok, I love you anyway'.  She reports she has tried counting calories, intermittent fasting, and other various dieting methods w/o success.  She is limited in her ability to exercise due to hip and knee arthritis.  Will refer to [facility] for complete evaluation and tx.  Pt expressed understanding and is in agreement w/ plan.

## 2023-08-24 DIAGNOSIS — H26492 Other secondary cataract, left eye: Secondary | ICD-10-CM | POA: Diagnosis not present

## 2023-08-24 DIAGNOSIS — H43823 Vitreomacular adhesion, bilateral: Secondary | ICD-10-CM | POA: Diagnosis not present

## 2023-08-24 DIAGNOSIS — H353114 Nonexudative age-related macular degeneration, right eye, advanced atrophic with subfoveal involvement: Secondary | ICD-10-CM | POA: Diagnosis not present

## 2023-08-24 DIAGNOSIS — H353123 Nonexudative age-related macular degeneration, left eye, advanced atrophic without subfoveal involvement: Secondary | ICD-10-CM | POA: Diagnosis not present

## 2023-08-28 ENCOUNTER — Telehealth: Payer: Self-pay | Admitting: Family Medicine

## 2023-08-28 NOTE — Telephone Encounter (Signed)
Caller name: Kylei Beddoe  On DPR?: Yes  Call back number: [phone]  Provider they see: Sheliah Hatch, MD  Reason for call:   Pt states she was waiting on weight loss referral but hasn't heard anything -advise

## 2023-08-28 NOTE — Telephone Encounter (Signed)
Per last note 04/07/2023 did not see a referral was place or mention of one. Is this okay to place?

## 2023-08-31 NOTE — Telephone Encounter (Signed)
I apologize for the delay.  The referral has been placed and they should contact her shortly

## 2023-08-31 NOTE — Telephone Encounter (Signed)
Pt has been informed and will await their call

## 2023-08-31 NOTE — Addendum Note (Signed)
Addended by: Sheliah Hatch on: 08/31/2023 10:42 AM   Modules accepted: Orders

## 2023-09-21 DIAGNOSIS — E669 Obesity, unspecified: Secondary | ICD-10-CM | POA: Diagnosis not present

## 2023-09-21 DIAGNOSIS — E538 Deficiency of other specified B group vitamins: Secondary | ICD-10-CM | POA: Diagnosis not present

## 2023-09-21 DIAGNOSIS — I10 Essential (primary) hypertension: Secondary | ICD-10-CM | POA: Diagnosis not present

## 2023-09-21 DIAGNOSIS — R5383 Other fatigue: Secondary | ICD-10-CM | POA: Diagnosis not present

## 2023-09-21 DIAGNOSIS — Z1330 Encounter for screening examination for mental health and behavioral disorders, unspecified: Secondary | ICD-10-CM | POA: Diagnosis not present

## 2023-09-25 ENCOUNTER — Encounter: Payer: Self-pay | Admitting: Family Medicine

## 2023-09-25 ENCOUNTER — Ambulatory Visit: Payer: Medicare PPO | Admitting: Family Medicine

## 2023-09-25 VITALS — BP 112/70 | HR 80 | Temp 97.8°F | Ht 64.0 in | Wt 219.8 lb

## 2023-09-25 DIAGNOSIS — F329 Major depressive disorder, single episode, unspecified: Secondary | ICD-10-CM

## 2023-09-25 NOTE — Progress Notes (Signed)
   Subjective:    Patient ID: Diana Vincent, female    DOB: June 04, 1956, 67 y.o.   MRN: [id number]  HPI Depression- at last visit we increased the Lamictal to 200mg  nightly.  Pt reports increasing medication has been helpful.  Feels 'more stabilized'.  Has not been able to schedule a counseling appt.  Weight loss appointment did not go as well as she had hoped.  Was started on Phentermine but felt the woman was very blunt and didn't leave feeling very good about things.  Has a counseling appt scheduled through their office but that's not until late November.   Review of Systems For ROS see HPI     Objective:   Physical Exam Vitals reviewed.  Constitutional:      General: She is not in acute distress.    Appearance: Normal appearance. She is obese. She is not ill-appearing.  HENT:     Head: Normocephalic and atraumatic.  Skin:    General: Skin is warm and dry.  Neurological:     General: No focal deficit present.     Mental Status: She is alert and oriented to person, place, and time.  Psychiatric:        Mood and Affect: Mood normal.        Behavior: Behavior normal.        Thought Content: Thought content normal.           Assessment & Plan:

## 2023-09-25 NOTE — Assessment & Plan Note (Signed)
Ongoing issue for pt.  Somewhat better w/ increased Lamictal but is frustrated w/ her inability to get into a counselor sooner.  She reports she wants a female provider, in person, and can only do Monday mornings or Fridays.  I gave her a list of resources to see if she can get a sooner appt.  Will continue to follow.

## 2023-09-25 NOTE — Patient Instructions (Addendum)
Schedule your complete physical in late April CONTINUE the Lamotrigine nightly Consider calling [facility] [phone], [facility] [phone], [facility] [phone] Dermatology- [facility] [phone], [facility] [phone], [facility] [phone] Call with any questions or concerns Stay Safe!  Stay Healthy! Hang in there!

## 2023-10-01 ENCOUNTER — Ambulatory Visit: Payer: Medicare PPO | Admitting: Podiatry

## 2023-10-02 ENCOUNTER — Ambulatory Visit: Payer: Medicare PPO | Admitting: Podiatry

## 2023-10-05 DIAGNOSIS — H353123 Nonexudative age-related macular degeneration, left eye, advanced atrophic without subfoveal involvement: Secondary | ICD-10-CM | POA: Diagnosis not present

## 2023-10-05 DIAGNOSIS — H353114 Nonexudative age-related macular degeneration, right eye, advanced atrophic with subfoveal involvement: Secondary | ICD-10-CM | POA: Diagnosis not present

## 2023-10-05 DIAGNOSIS — H26492 Other secondary cataract, left eye: Secondary | ICD-10-CM | POA: Diagnosis not present

## 2023-10-05 DIAGNOSIS — H43823 Vitreomacular adhesion, bilateral: Secondary | ICD-10-CM | POA: Diagnosis not present

## 2023-10-06 ENCOUNTER — Telehealth: Payer: Self-pay | Admitting: Family Medicine

## 2023-10-06 NOTE — Telephone Encounter (Signed)
Called pt and she states she has a itchy scalp,face and hands swelling Pt reports she has a issues with urination. Pt reports she things this is due to the medication.

## 2023-10-06 NOTE — Telephone Encounter (Signed)
Tried to get pt in office to see NP and she states she is out of town and has a appt. To see Dr.Tabori on Friday. I advise her to be seen at Urgent Care or ER since she was out of town and she states this has been going on for a few weeks and she does not feel this is urgent.

## 2023-10-06 NOTE — Telephone Encounter (Signed)
Caller name: Marlyse Poggi  On DPR?: Yes  Call back number: [phone] (home)  Provider they see: Sheliah Hatch, MD  Reason for call:   Pt states she feels as though she's having a allergic reaction to lamoTRIgine (LAMICTAL) 200 MG tablet

## 2023-10-07 NOTE — Telephone Encounter (Signed)
Agree with advice given.  If symptoms change or worsen, she will need to be see at the ER or UC.  Otherwise, we can assess on Friday when she has her appt

## 2023-10-09 ENCOUNTER — Ambulatory Visit: Payer: Medicare PPO | Admitting: Podiatry

## 2023-10-09 ENCOUNTER — Encounter: Payer: Self-pay | Admitting: Podiatry

## 2023-10-09 ENCOUNTER — Ambulatory Visit: Payer: Medicare PPO | Admitting: Family Medicine

## 2023-10-09 ENCOUNTER — Encounter: Payer: Self-pay | Admitting: Family Medicine

## 2023-10-09 VITALS — BP 110/68 | HR 65 | Temp 97.6°F | Ht 64.0 in | Wt 212.1 lb

## 2023-10-09 VITALS — Ht 64.0 in | Wt 219.0 lb

## 2023-10-09 DIAGNOSIS — F329 Major depressive disorder, single episode, unspecified: Secondary | ICD-10-CM | POA: Diagnosis not present

## 2023-10-09 DIAGNOSIS — M778 Other enthesopathies, not elsewhere classified: Secondary | ICD-10-CM

## 2023-10-09 DIAGNOSIS — T7840XA Allergy, unspecified, initial encounter: Secondary | ICD-10-CM | POA: Diagnosis not present

## 2023-10-09 MED ORDER — TRIAMCINOLONE ACETONIDE 10 MG/ML IJ SUSP
10.0000 mg | Freq: Once | INTRAMUSCULAR | Status: AC
Start: 2023-10-09 — End: 2023-10-09
  Administered 2023-10-09: 10 mg via INTRA_ARTICULAR

## 2023-10-09 MED ORDER — LAMOTRIGINE 100 MG PO TABS: 100.0000 mg | ORAL_TABLET | Freq: Every day | ORAL | 1 refills | Status: DC

## 2023-10-09 NOTE — Progress Notes (Signed)
   Subjective:    Patient ID: Diana Vincent, female    DOB: [birth year], 67 y.o.   MRN: [id number]  HPI Medication sensitivity- pt initially thought she was having an allergic rxn to the increased dose of Lamictal.  1 week after increasing dose had a very itchy scalp, facial itching.  Denies redness, flaking, crusting.  Pt decreased Lamictal dose back to 150mg  on Monday and reports itching is somewhat improved but not resolved.  No other recent med changes or dietary changes.     Review of Systems For ROS see HPI     Objective:   Physical Exam Vitals reviewed.  Constitutional:      General: She is not in acute distress.    Appearance: Normal appearance. She is obese. She is not ill-appearing.  HENT:     Head: Normocephalic and atraumatic.  Eyes:     Extraocular Movements: Extraocular movements intact.     Conjunctiva/sclera: Conjunctivae normal.  Cardiovascular:     Rate and Rhythm: Normal rate.  Pulmonary:     Effort: Pulmonary effort is normal.  Skin:    General: Skin is warm and dry.     Findings: No erythema or rash.  Neurological:     General: No focal deficit present.     Mental Status: She is alert and oriented to person, place, and time.  Psychiatric:        Mood and Affect: Mood normal.        Behavior: Behavior normal.        Thought Content: Thought content normal.           Assessment & Plan:  Medication sensitivity- see A&P under Chronic Depression

## 2023-10-09 NOTE — Patient Instructions (Signed)
Follow up as needed or as scheduled Message me and let me know how you're doing on the 100mg  dose IF you can tolerate the 100mg - hold here If you can't tolerate the 100mg  daily dose, decrease to 1/2 tab daily Once we have determined what's causing what, we can revisit mood and consider adding something Call with any questions or concerns Hang in there!!!

## 2023-10-09 NOTE — Assessment & Plan Note (Signed)
Deteriorated.  We had increased her Lamictal dose at last visit in hopes of improving mood.  Pt reports that 1 week after increased dose had intense itching and feels it's related to the Lamictal.  Decreased the dose back to 150mg  5 days ago with only minimal improvement in itching.  Will decrease to 100mg  daily.  If she can tolerate at this dose, will hold here.  If she continues to itch, will decrease to 50mg  daily and then stop completely.  Once we know where we stan w/ the Lamictal, will plan to start Wellbutrin to help w/ mood.  Pt expressed understanding and is in agreement w/ plan.

## 2023-10-09 NOTE — Progress Notes (Signed)
Subjective:   Patient ID: Diana Vincent, female   DOB: 67 y.o.   MRN: [id number]   HPI Patient states she flared up her right foot chasing her 43-year-old and she knows it is a little earlier but it has been quite swollen and sore   ROS      Objective:  Physical Exam  Neurovascular status intact inflammation of the extensor complex right fluid buildup     Assessment:  Extensor tendinitis right     Plan:  Sterile prep injected the extensor tendon right 3 mg Dexasone Kenalog fungal Xylocaine after explaining chances for rupture patient will be easy on this will be seen back to recheck

## 2023-10-16 ENCOUNTER — Ambulatory Visit (AMBULATORY_SURGERY_CENTER): Payer: Medicare PPO

## 2023-10-16 VITALS — Ht 64.0 in | Wt 215.0 lb

## 2023-10-16 DIAGNOSIS — Z860100 Personal history of colon polyps, unspecified: Secondary | ICD-10-CM

## 2023-10-16 MED ORDER — NA SULFATE-K SULFATE-MG SULF 17.5-3.13-1.6 GM/177ML PO SOLN: 1.0000 | Freq: Once | ORAL | 0 refills | Status: AC

## 2023-10-16 NOTE — Progress Notes (Signed)
Pre visit completed via phone call; Patient verified name, DOB, and address; No egg or soy allergy known to patient;  No issues known to pt with past sedation with any surgeries or procedures; Patient denies ever being told they had issues or difficulty with intubation;  No FH of Malignant Hyperthermia; Pt is not on diet pills; Pt is not on home 02;  Pt is not on blood thinners;  Pt reports issues with constipation-patient reports she already increases oral fluids, activity, and fruits/veggies, also takes Colace if needed;  No A fib or A flutter; Have any cardiac testing pending--NO Insurance verified during PV appt--- [employer] Medicare Pt can ambulate without assistance;  Pt denies use of chewing tobacco; Discussed diabetic/weight loss medication holds; Discussed NSAID holds; Checked BMI to be less than 50; Pt instructed to use Single[employer] or GoodRx for a price reduction on prep;  Patient's chart reviewed by Cathlyn Parsons CNRA prior to previsit and patient appropriate for the [facility];  Pre visit completed and red dot placed by patient's name on their procedure day (on provider's schedule);  Instructions sent to MyChart per her request;

## 2023-10-18 ENCOUNTER — Other Ambulatory Visit: Payer: Self-pay | Admitting: Family Medicine

## 2023-10-19 DIAGNOSIS — M170 Bilateral primary osteoarthritis of knee: Secondary | ICD-10-CM | POA: Diagnosis not present

## 2023-10-19 NOTE — Telephone Encounter (Signed)
Patient is requesting a refill of the following medications: Requested Prescriptions   Pending Prescriptions Disp Refills   zolpidem (AMBIEN) 10 MG tablet [Pharmacy Med Name: ZOLPIDEM 10MG  TABLETS] 30 tablet     Sig: TAKE 1 TABLET(10 MG) BY MOUTH AT BEDTIME AS NEEDED FOR SLEEP    Date of patient request: 10/19/23 Last office visit: 10/09/23 Date of last refill: 06/15/23 Last refill amount: 30 Follow up time period per chart: as scheduled

## 2023-10-26 ENCOUNTER — Encounter: Payer: Self-pay | Admitting: Internal Medicine

## 2023-10-26 DIAGNOSIS — M170 Bilateral primary osteoarthritis of knee: Secondary | ICD-10-CM | POA: Diagnosis not present

## 2023-10-26 DIAGNOSIS — M1711 Unilateral primary osteoarthritis, right knee: Secondary | ICD-10-CM | POA: Diagnosis not present

## 2023-11-02 ENCOUNTER — Telehealth: Payer: Self-pay | Admitting: Family Medicine

## 2023-11-02 MED ORDER — BUPROPION HCL 75 MG PO TABS: 75.0000 mg | ORAL_TABLET | Freq: Two times a day (BID) | ORAL | 3 refills | Status: DC

## 2023-11-02 NOTE — Telephone Encounter (Signed)
Prescription for Wellbutrin 75mg  BID was sent to pharmacy

## 2023-11-02 NOTE — Telephone Encounter (Signed)
Last seen 10/25 This was talked about at this visit.  Please advise

## 2023-11-02 NOTE — Telephone Encounter (Signed)
Pt has been notified.

## 2023-11-02 NOTE — Telephone Encounter (Signed)
Caller name: Haidee Hung  On DPR?: Yes  Call back number: There is no such number on file (mobile).  Provider they see: Sheliah Hatch, MD  Reason for call:  Pt stated she has cut back on the Lamotrigine and is taking 50mg  at night. She also stated she is ready to start on the lowest dose of Wellbutrin if a prescription could be sent in to  [id number] - [place], [place] - [address] AT [facility] OF [address]

## 2023-11-03 DIAGNOSIS — M170 Bilateral primary osteoarthritis of knee: Secondary | ICD-10-CM | POA: Diagnosis not present

## 2023-11-06 ENCOUNTER — Ambulatory Visit: Payer: Medicare PPO | Admitting: Internal Medicine

## 2023-11-06 ENCOUNTER — Encounter: Payer: Self-pay | Admitting: Internal Medicine

## 2023-11-06 VITALS — BP 124/69 | HR 81 | Temp 98.0°F | Resp 12 | Ht 64.0 in | Wt 215.0 lb

## 2023-11-06 DIAGNOSIS — K5730 Diverticulosis of large intestine without perforation or abscess without bleeding: Secondary | ICD-10-CM

## 2023-11-06 DIAGNOSIS — F419 Anxiety disorder, unspecified: Secondary | ICD-10-CM | POA: Diagnosis not present

## 2023-11-06 DIAGNOSIS — Z860100 Personal history of colon polyps, unspecified: Secondary | ICD-10-CM

## 2023-11-06 DIAGNOSIS — F32A Depression, unspecified: Secondary | ICD-10-CM | POA: Diagnosis not present

## 2023-11-06 DIAGNOSIS — Z860101 Personal history of adenomatous and serrated colon polyps: Secondary | ICD-10-CM | POA: Diagnosis not present

## 2023-11-06 DIAGNOSIS — D123 Benign neoplasm of transverse colon: Secondary | ICD-10-CM

## 2023-11-06 DIAGNOSIS — K5790 Diverticulosis of intestine, part unspecified, without perforation or abscess without bleeding: Secondary | ICD-10-CM

## 2023-11-06 DIAGNOSIS — D124 Benign neoplasm of descending colon: Secondary | ICD-10-CM | POA: Diagnosis not present

## 2023-11-06 DIAGNOSIS — K648 Other hemorrhoids: Secondary | ICD-10-CM

## 2023-11-06 DIAGNOSIS — Z1211 Encounter for screening for malignant neoplasm of colon: Secondary | ICD-10-CM | POA: Diagnosis not present

## 2023-11-06 MED ORDER — SODIUM CHLORIDE 0.9 % IV SOLN: 500.0000 mL | Freq: Once | INTRAVENOUS | Status: DC

## 2023-11-06 NOTE — Op Note (Signed)
[facility] Patient Name: Diana Vincent Procedure Date: 11/06/2023 10:16 AM MRN: [id number] Endoscopist: Beverley Fiedler , MD, [id number] Age: 67 Referring MD:  Date of Birth: 1956/12/04 Gender: Female Account #: [id number] Procedure:                Colonoscopy Indications:              High risk colon cancer surveillance: Personal                            history of multiple adenomas, Last colonoscopy:                            June 2021 (5 TAs), Oct 2018 (multiple TAs including                            those > 1 cm) Medicines:                Monitored Anesthesia Care Procedure:                Pre-Anesthesia Assessment:                           - Prior to the procedure, a History and Physical                            was performed, and patient medications and                            allergies were reviewed. The patient's tolerance of                            previous anesthesia was also reviewed. The risks                            and benefits of the procedure and the sedation                            options and risks were discussed with the patient.                            All questions were answered, and informed consent                            was obtained. Prior Anticoagulants: The patient has                            taken no anticoagulant or antiplatelet agents. ASA                            Grade Assessment: II - A patient with mild systemic                            disease. After reviewing the risks and benefits,  the patient was deemed in satisfactory condition to                            undergo the procedure.                           After obtaining informed consent, the colonoscope                            was passed under direct vision. Throughout the                            procedure, the patient's blood pressure, pulse, and                            oxygen saturations were monitored continuously. The                             Olympus Scope SN: [id number] was introduced through                            the anus and advanced to the cecum, identified by                            the appendiceal orifice. The colonoscopy was                            performed without difficulty. The patient tolerated                            the procedure well. The quality of the bowel                            preparation was good. The ileocecal valve,                            appendiceal orifice, and rectum were photographed. Scope In: 10:50:08 AM Scope Out: 11:12:59 AM Scope Withdrawal Time: 0 hours 18 minutes 7 seconds  Total Procedure Duration: 0 hours 22 minutes 51 seconds  Findings:                 The digital rectal exam was normal.                           A 4 mm polyp was found in the transverse colon. The                            polyp was sessile. The polyp was removed with a                            cold snare. Resection and retrieval were complete.                           Multiple medium-mouthed and small-mouthed  diverticula were found in the sigmoid colon,                            descending colon and ascending colon.                           Internal hemorrhoids were found during                            retroflexion. The hemorrhoids were small. Complications:            No immediate complications. Estimated Blood Loss:     Estimated blood loss: none. Impression:               - One 4 mm polyp in the transverse colon, removed                            with a cold snare. Resected and retrieved.                           - Moderate diverticulosis in the sigmoid colon, in                            the descending colon and in the ascending colon.                           - Small internal hemorrhoids. Recommendation:           - Patient has a contact number available for                            emergencies. The signs and symptoms of potential                             delayed complications were discussed with the                            patient. Return to normal activities tomorrow.                            Written discharge instructions were provided to the                            patient.                           - Resume previous diet.                           - Continue present medications. Over the counter                            senna can be tried for constipation (as Miralax and                            Metamucil causes gas and bloating symptom)                           -  Await pathology results.                           - Repeat colonoscopy in 5 years for surveillance. Beverley Fiedler, MD 11/06/2023 11:17:09 AM This report has been signed electronically.

## 2023-11-06 NOTE — Progress Notes (Signed)
Called to room to assist during endoscopic procedure.  Patient ID and intended procedure confirmed with present staff. Received instructions for my participation in the procedure from the performing physician.  

## 2023-11-06 NOTE — Progress Notes (Signed)
Sedate, gd SR, tolerated procedure well, VSS, report to RN 

## 2023-11-06 NOTE — Progress Notes (Signed)
Pt's states no medical or surgical changes since previsit or office visit. 

## 2023-11-06 NOTE — Progress Notes (Signed)
GASTROENTEROLOGY PROCEDURE H&P NOTE   Primary Care Physician: Sheliah Hatch, MD    Reason for Procedure:  History of multiple adenomatous colon polyps  Plan:    Colonoscopy  Patient is appropriate for endoscopic procedure(s) in the ambulatory ([facility]) setting.  The nature of the procedure, as well as the risks, benefits, and alternatives were carefully and thoroughly reviewed with the patient. Ample time for discussion and questions allowed. The patient understood, was satisfied, and agreed to proceed.     HPI: Diana Vincent is a 67 y.o. female who presents for surveillance colonoscopy.  Medical history as below.  Tolerated the prep.  No recent chest pain or shortness of breath.  No abdominal pain today.  Past Medical History:  Diagnosis Date   Allergic rhinitis    Anxiety    Arthritis    Cataract    Depression    on meds   Head injury    hairline fx from fall out of swing-    Head injury without skull fracture    no LOC   Hypertension     Past Surgical History:  Procedure Laterality Date   ABDOMINAL HYSTERECTOMY  1990s   ANTERIOR AND POSTERIOR REPAIR  07/07/2012   Procedure: ANTERIOR (CYSTOCELE) AND POSTERIOR REPAIR (RECTOCELE);  Surgeon: Loney Laurence, MD;  Location: [facility];  Service: Gynecology;  Laterality: N/A;   BLADDER SUSPENSION  07/07/2012   Procedure: TRANSVAGINAL TAPE (TVT) PROCEDURE;  Surgeon: Loney Laurence, MD;  Location: [facility];  Service: Gynecology;  Laterality: N/A;   CATARACT EXTRACTION Right    Groat/[place]   CATARACT EXTRACTION Left    [place]   CHOLECYSTECTOMY  2009   COLONOSCOPY  2021   JMP-MAC-prep good-TA/tics/hems   CYSTOSCOPY  07/07/2012   Procedure: CYSTOSCOPY;  Surgeon: Loney Laurence, MD;  Location: [facility];  Service: Gynecology;;   POLYPECTOMY  2021   TA   TOTAL HIP ARTHROPLASTY Right    TOTAL HIP ARTHROPLASTY Left 04/2023    Prior to Admission medications   Medication Sig Start Date End Date Taking?  Authorizing Provider  amLODipine (NORVASC) 5 MG tablet TAKE 1 TABLET(5 MG) BY MOUTH DAILY 09/17/22  Yes Sheliah Hatch, MD  Biotin 5 MG CAPS Take by mouth daily.   Yes [provider]  Cholecalciferol (D3 ADULT PO) Take by mouth daily.   Yes [provider]  lamoTRIgine (LAMICTAL) 100 MG tablet Take 1 tablet (100 mg total) by mouth at bedtime. 10/09/23  Yes Sheliah Hatch, MD  Loratadine (CLARITIN PO) Take by mouth daily.   Yes [provider]  Multiple Vitamins-Minerals (PRESERVISION AREDS PO) Take 2 tablets by mouth daily.   Yes [provider]  MYRBETRIQ 50 MG TB24 tablet Take 50 mg by mouth daily. 01/26/23  Yes [provider]  olmesartan-hydrochlorothiazide (BENICAR HCT) 40-12.5 MG tablet Take 1 tablet by mouth daily. 07/20/23  Yes Sheliah Hatch, MD  zolpidem (AMBIEN) 10 MG tablet TAKE 1 TABLET(10 MG) BY MOUTH AT BEDTIME AS NEEDED FOR SLEEP 10/19/23  Yes Sheliah Hatch, MD  buPROPion (WELLBUTRIN) 75 MG tablet Take 1 tablet (75 mg total) by mouth 2 (two) times daily. 11/02/23   Sheliah Hatch, MD    Current Outpatient Medications  Medication Sig Dispense Refill   amLODipine (NORVASC) 5 MG tablet TAKE 1 TABLET(5 MG) BY MOUTH DAILY 90 tablet 1   Biotin 5 MG CAPS Take by mouth daily.     Cholecalciferol (D3 ADULT PO) Take by  mouth daily.     lamoTRIgine (LAMICTAL) 100 MG tablet Take 1 tablet (100 mg total) by mouth at bedtime. 30 tablet 1   Loratadine (CLARITIN PO) Take by mouth daily.     Multiple Vitamins-Minerals (PRESERVISION AREDS PO) Take 2 tablets by mouth daily.     MYRBETRIQ 50 MG TB24 tablet Take 50 mg by mouth daily.     olmesartan-hydrochlorothiazide (BENICAR HCT) 40-12.5 MG tablet Take 1 tablet by mouth daily. 90 tablet 1   zolpidem (AMBIEN) 10 MG tablet TAKE 1 TABLET(10 MG) BY MOUTH AT BEDTIME AS NEEDED FOR SLEEP 30 tablet 3   buPROPion (WELLBUTRIN) 75 MG tablet Take 1 tablet (75 mg total) by mouth 2 (two) times  daily. 60 tablet 3   Current Facility-Administered Medications  Medication Dose Route Frequency Provider Last Rate Last Admin   0.9 %  sodium chloride infusion  500 mL Intravenous Once Armie Moren, Carie Caddy, MD        Allergies as of 11/06/2023   (No Known Allergies)    Family History  Problem Relation Age of Onset   Colon polyps Mother    Leukemia Mother    Hypertension Mother    Cancer Father        skin   Hypertension Father    Colon cancer Neg Hx    Stomach cancer Neg Hx    Esophageal cancer Neg Hx    Rectal cancer Neg Hx     Social History   Socioeconomic History   Marital status: Married    Spouse name: Not on file   Number of children: 2   Years of education: Not on file   Highest education level: Not on file  Occupational History   Not on file  Tobacco Use   Smoking status: Never   Smokeless tobacco: Never  Vaping Use   Vaping status: Never Used  Substance and Sexual Activity   Alcohol use: Not Currently   Drug use: No   Sexual activity: Not Currently  Other Topics Concern   Not on file  Social History Narrative   Not on file   Social Determinants of Health   Financial Resource Strain: Low Risk  (09/20/2023)   Received from [facility]   Overall Financial Resource Strain (CARDIA)    Difficulty of Paying Living Expenses: Not hard at all  Food Insecurity: No Food Insecurity (09/20/2023)   Received from [facility]   Hunger Vital Sign    Worried About Running Out of Food in the Last Year: Never true    Ran Out of Food in the Last Year: Never true  Transportation Needs: No Transportation Needs (09/20/2023)   Received from [facility] - Transportation    Lack of Transportation (Medical): No    Lack of Transportation (Non-Medical): No  Physical Activity: Insufficiently Active (09/20/2023)   Received from [facility]   Exercise Vital Sign    Days of Exercise per Week: 3 days    Minutes of Exercise per Session: 30 min  Stress: No Stress  Concern Present (09/20/2023)   Received from [facility] - Occupational Stress Questionnaire    Feeling of Stress : Only a little  Social Connections: Somewhat Isolated (09/20/2023)   Received from [facility]   Social Network    How would you rate your social network (family, work, friends)?: Restricted participation with some degree of social isolation  Intimate Partner Violence: Not At Risk (09/20/2023)  Received from [facility]   HITS    Over the last 12 months how often did your partner physically hurt you?: Never    Over the last 12 months how often did your partner insult you or talk down to you?: Never    Over the last 12 months how often did your partner threaten you with physical harm?: Never    Over the last 12 months how often did your partner scream or curse at you?: Never    Physical Exam: Vital signs in last 24 hours: @BP  113/62   Pulse 95   Temp 98 F (36.7 C) (Skin)   Ht 5\' 4"  (1.626 m)   Wt 215 lb (97.5 kg)   SpO2 98%   BMI 36.90 kg/m  GEN: NAD EYE: Sclerae anicteric ENT: MMM CV: Non-tachycardic Pulm: CTA b/l GI: Soft, NT/ND NEURO:  Alert & Oriented x 3   Erick Blinks, MD [facility]  11/06/2023 10:43 AM

## 2023-11-06 NOTE — Patient Instructions (Addendum)
Continue present medications. Over the counter senna can be tried for constipation (as Miralax and Metamucil causes gas and bloating symptom) Await pathology results. Repeat colonoscopy in 5 years for surveillance.  Please read over handouts provided  YOU HAD AN ENDOSCOPIC PROCEDURE TODAY AT THE [facility]:   Refer to the procedure report that was given to you for any specific questions about what was found during the examination.  If the procedure report does not answer your questions, please call your gastroenterologist to clarify.  If you requested that your care partner not be given the details of your procedure findings, then the procedure report has been included in a sealed envelope for you to review at your convenience later.  YOU SHOULD EXPECT: Some feelings of bloating in the abdomen. Passage of more gas than usual.  Walking can help get rid of the air that was put into your GI tract during the procedure and reduce the bloating. If you had a lower endoscopy (such as a colonoscopy or flexible sigmoidoscopy) you may notice spotting of blood in your stool or on the toilet paper. If you underwent a bowel prep for your procedure, you may not have a normal bowel movement for a few days.  Please Note:  You might notice some irritation and congestion in your nose or some drainage.  This is from the oxygen used during your procedure.  There is no need for concern and it should clear up in a day or so.  SYMPTOMS TO REPORT IMMEDIATELY:  Following lower endoscopy (colonoscopy or flexible sigmoidoscopy):  Excessive amounts of blood in the stool  Significant tenderness or worsening of abdominal pains  Swelling of the abdomen that is new, acute  Fever of 100F or higher  For urgent or emergent issues, a gastroenterologist can be reached at any hour by calling [phone]. Do not use MyChart messaging for urgent concerns.    DIET:  We do recommend a small meal at first, but then  you may proceed to your regular diet.  Drink plenty of fluids but you should avoid alcoholic beverages for 24 hours.  ACTIVITY:  You should plan to take it easy for the rest of today and you should NOT DRIVE or use heavy machinery until tomorrow (because of the sedation medicines used during the test).    FOLLOW UP: Our staff will call the number listed on your records the next business day following your procedure.  We will call around 7:15- 8:00 am to check on you and address any questions or concerns that you may have regarding the information given to you following your procedure. If we do not reach you, we will leave a message.     If any biopsies were taken you will be contacted by phone or by letter within the next 1-3 weeks.  Please call us at [phone] if you have not heard about the biopsies in 3 weeks.    SIGNATURES/CONFIDENTIALITY: You and/or your care partner have signed paperwork which will be entered into your electronic medical record.  These signatures attest to the fact that that the information above on your After Visit Summary has been reviewed and is understood.  Full responsibility of the confidentiality of this discharge information lies with you and/or your care-partner.

## 2023-11-09 ENCOUNTER — Telehealth: Payer: Self-pay

## 2023-11-09 DIAGNOSIS — F54 Psychological and behavioral factors associated with disorders or diseases classified elsewhere: Secondary | ICD-10-CM | POA: Diagnosis not present

## 2023-11-09 DIAGNOSIS — E669 Obesity, unspecified: Secondary | ICD-10-CM | POA: Diagnosis not present

## 2023-11-09 NOTE — Telephone Encounter (Deleted)
  Follow up Call-     11/06/2023    9:51 AM  Call back number  Post procedure Call Back phone  # [phone]  Permission to leave phone message Yes     Patient questions:  Do you have a fever, pain , or abdominal swelling? No. Pain Score  0 *  Have you tolerated food without any problems? Yes.    Have you been able to return to your normal activities? Yes.    Do you have any questions about your discharge instructions: Diet   No. Medications  No. Follow up visit  No.  Do you have questions or concerns about your Care? No.  Actions: * If pain score is 4 or above: No action needed, pain <4.

## 2023-11-09 NOTE — Telephone Encounter (Signed)
Post procedure follow up call, no answer 

## 2023-11-10 ENCOUNTER — Encounter: Payer: Self-pay | Admitting: Internal Medicine

## 2023-11-10 LAB — SURGICAL PATHOLOGY

## 2023-11-16 DIAGNOSIS — H353123 Nonexudative age-related macular degeneration, left eye, advanced atrophic without subfoveal involvement: Secondary | ICD-10-CM | POA: Diagnosis not present

## 2023-11-16 DIAGNOSIS — H43823 Vitreomacular adhesion, bilateral: Secondary | ICD-10-CM | POA: Diagnosis not present

## 2023-11-16 DIAGNOSIS — H353114 Nonexudative age-related macular degeneration, right eye, advanced atrophic with subfoveal involvement: Secondary | ICD-10-CM | POA: Diagnosis not present

## 2023-11-16 DIAGNOSIS — H26492 Other secondary cataract, left eye: Secondary | ICD-10-CM | POA: Diagnosis not present

## 2023-11-28 ENCOUNTER — Other Ambulatory Visit: Payer: Self-pay | Admitting: Family Medicine

## 2023-12-03 ENCOUNTER — Ambulatory Visit: Payer: Self-pay | Admitting: Family Medicine

## 2023-12-03 NOTE — Telephone Encounter (Signed)
Copied from CRM [id number]. Topic: Clinical - Medication Question >> Dec 03, 2023  2:04 PM Larwance Sachs wrote: Reason for CRM: Patient called in regarding medication buPROPion (WELLBUTRIN) 75 MG tablet, needs further instructions regarding how to use medication . Reason for Disposition  General information question, no triage required and triager able to answer question  Answer Assessment - Initial Assessment Questions 1. REASON FOR CALL or QUESTION: "What is your reason for calling today?" or "How can I best help you?" or "What question do you have that I can help answer?"     "I was only taking 1 pill of Wellbutrin ,but noticed after I got refill I was suppose to take it 2x daily. How should I take it?  Protocols used: Information Only Call - No Triage-A-AH    Chief Complaint: medication question  Disposition: [] ED /[] Urgent Care (no appt availability in office) / [] Appointment(In office/virtual)/ []  [facility]/ [x] Home Care/ [] Refused Recommended Disposition /[] [facility]/ []  Follow-up with PCP Additional Notes: Pt noticed she wasn't taking medication correctly. She was taking it once daily- every morning and after refill she noticed the bottle said to take twice daily. Pt wanted clarification. RN educated pt on Dr orders and instructed pt to space out dosing to every 12 hours equaling 2 tabs daily. Pt verbalized understanding.

## 2023-12-07 DIAGNOSIS — I10 Essential (primary) hypertension: Secondary | ICD-10-CM | POA: Diagnosis not present

## 2023-12-07 DIAGNOSIS — E669 Obesity, unspecified: Secondary | ICD-10-CM | POA: Diagnosis not present

## 2023-12-07 DIAGNOSIS — E538 Deficiency of other specified B group vitamins: Secondary | ICD-10-CM | POA: Diagnosis not present

## 2023-12-08 ENCOUNTER — Other Ambulatory Visit: Payer: Self-pay

## 2023-12-08 DIAGNOSIS — I10 Essential (primary) hypertension: Secondary | ICD-10-CM

## 2023-12-08 MED ORDER — AMLODIPINE BESYLATE 5 MG PO TABS: ORAL_TABLET | ORAL | 1 refills | Status: DC

## 2023-12-14 ENCOUNTER — Telehealth: Payer: Self-pay

## 2023-12-14 DIAGNOSIS — M1711 Unilateral primary osteoarthritis, right knee: Secondary | ICD-10-CM | POA: Diagnosis not present

## 2023-12-14 DIAGNOSIS — M170 Bilateral primary osteoarthritis of knee: Secondary | ICD-10-CM | POA: Diagnosis not present

## 2023-12-14 MED ORDER — BUPROPION HCL ER (XL) 150 MG PO TB24: 150.0000 mg | ORAL_TABLET | Freq: Every day | ORAL | 3 refills | Status: DC

## 2023-12-14 NOTE — Addendum Note (Signed)
Addended by: Sheliah Hatch on: 12/14/2023 12:35 PM   Modules accepted: Orders

## 2023-12-14 NOTE — Telephone Encounter (Signed)
Pt has been informed.

## 2023-12-14 NOTE — Telephone Encounter (Signed)
Copied from CRM [id number]. Topic: Clinical - Medication Refill >> Dec 14, 2023 10:49 AM Tiffany H wrote: Most Recent Primary Care Visit:  Provider: Sheliah Hatch  Department: [facility]  Visit Type: OFFICE VISIT  Date: 10/09/2023  Medication: Patient would like to know if Wellbutrin can be changed to an ER 1x per day. Patient is currently struggling to remember to take Wellbutrin 2x per day.   amLODipine (NORVASC) 5 MG tablet buPROPion (WELLBUTRIN) 75 MG tablet  Has the patient contacted their pharmacy? Yes (Agent: If no, request that the patient contact the pharmacy for the refill. If patient does not wish to contact the pharmacy document the reason why and proceed with request.) (Agent: If yes, when and what did the pharmacy advise?)  Is this the correct pharmacy for this prescription? Yes If no, delete pharmacy and type the correct one.  This is the patient's preferred pharmacy:  [id number] [place], [place] - [address] AT [facility] [address] [place] [address] Phone: [phone] Fax: [phone]   Has the prescription been filled recently? Yes  Is the patient out of the medication? No  Has the patient been seen for an appointment in the last year OR does the patient have an upcoming appointment? Yes  Can we respond through MyChart? Yes  Agent: Please be advised that Rx refills may take up to 3 business days. We ask that you follow-up with your pharmacy.

## 2023-12-14 NOTE — Telephone Encounter (Signed)
Can patient be changed to the Extended release please advise

## 2023-12-14 NOTE — Telephone Encounter (Signed)
Prescription for the Wellbutrin extended release 150mg  sent to pharmacy

## 2023-12-22 ENCOUNTER — Telehealth: Payer: Self-pay | Admitting: Family Medicine

## 2023-12-22 ENCOUNTER — Other Ambulatory Visit: Payer: Self-pay | Admitting: Family Medicine

## 2023-12-22 NOTE — Telephone Encounter (Signed)
 Requested Prescriptions   Pending Prescriptions Disp Refills   zolpidem  (AMBIEN ) 10 MG tablet 30 tablet 3    Sig: TAKE 1 TABLET(10 MG) BY MOUTH AT BEDTIME AS NEEDED FOR SLEEP     Date of patient request: 12/22/2023 Last office visit: 10/09/2023 Upcoming visit: 12/22/2023 Date of last refill: 10/19/2023 Last refill amount: 30

## 2023-12-22 NOTE — Addendum Note (Signed)
 Addended by: Eldred Manges on: 12/22/2023 03:40 PM   Modules accepted: Orders

## 2023-12-22 NOTE — Telephone Encounter (Signed)
 Placed in the sign folder at the nurse station

## 2023-12-22 NOTE — Telephone Encounter (Signed)
 Type of form received: Surgical Clearance  Additional comments: Pt has appt on 01/22/24 at 1:20pm  Received by: Charlott - Front Desk  Form should be Faxed/mailed to: (address/ fax #) [phone]  Is patient requesting call for pickup: N/A  Form placed:  Labeled & placed in provider bin  Attach charge sheet.  Provider will determine charge.  Individual made aware of 3-5 business day turn around? N/A

## 2023-12-22 NOTE — Telephone Encounter (Signed)
 Encourage patient to contact the pharmacy for refills or they can request refills through [facility]  (Please schedule appointment if patient has not been seen in over a year)    WHAT PHARMACY WOULD THEY LIKE THIS SENT TO: [id number] - [place], [place] - [address] AT [address]   MEDICATION NAME & DOSE: zolpidem  (AMBIEN ) 10 MG tablet   NOTES/COMMENTS FROM PATIENT:       Front office please notify patient: It takes 48-72 hours to process rx refill requests Ask patient to call pharmacy to ensure rx is ready before heading there.

## 2023-12-23 MED ORDER — ZOLPIDEM TARTRATE 10 MG PO TABS: ORAL_TABLET | ORAL | 3 refills | Status: DC

## 2023-12-23 NOTE — Telephone Encounter (Signed)
Will address at time of appt

## 2023-12-28 DIAGNOSIS — Z1272 Encounter for screening for malignant neoplasm of vagina: Secondary | ICD-10-CM | POA: Diagnosis not present

## 2023-12-28 DIAGNOSIS — Z01419 Encounter for gynecological examination (general) (routine) without abnormal findings: Secondary | ICD-10-CM | POA: Diagnosis not present

## 2023-12-28 DIAGNOSIS — Z1231 Encounter for screening mammogram for malignant neoplasm of breast: Secondary | ICD-10-CM | POA: Diagnosis not present

## 2024-01-04 DIAGNOSIS — H353123 Nonexudative age-related macular degeneration, left eye, advanced atrophic without subfoveal involvement: Secondary | ICD-10-CM | POA: Diagnosis not present

## 2024-01-04 DIAGNOSIS — H353114 Nonexudative age-related macular degeneration, right eye, advanced atrophic with subfoveal involvement: Secondary | ICD-10-CM | POA: Diagnosis not present

## 2024-01-04 DIAGNOSIS — H26492 Other secondary cataract, left eye: Secondary | ICD-10-CM | POA: Diagnosis not present

## 2024-01-04 DIAGNOSIS — H43823 Vitreomacular adhesion, bilateral: Secondary | ICD-10-CM | POA: Diagnosis not present

## 2024-01-17 ENCOUNTER — Other Ambulatory Visit: Payer: Self-pay | Admitting: Family Medicine

## 2024-01-17 DIAGNOSIS — I10 Essential (primary) hypertension: Secondary | ICD-10-CM

## 2024-01-18 DIAGNOSIS — H26492 Other secondary cataract, left eye: Secondary | ICD-10-CM | POA: Diagnosis not present

## 2024-01-22 ENCOUNTER — Ambulatory Visit: Payer: Medicare PPO | Admitting: Family Medicine

## 2024-01-22 VITALS — BP 110/74 | HR 94 | Temp 98.0°F | Wt 197.8 lb

## 2024-01-22 DIAGNOSIS — Z01818 Encounter for other preprocedural examination: Secondary | ICD-10-CM | POA: Diagnosis not present

## 2024-01-22 DIAGNOSIS — M170 Bilateral primary osteoarthritis of knee: Secondary | ICD-10-CM | POA: Diagnosis not present

## 2024-01-22 LAB — HEPATIC FUNCTION PANEL
ALT: 18 U/L (ref 0–35)
AST: 17 U/L (ref 0–37)
Albumin: 4.2 g/dL (ref 3.5–5.2)
Alkaline Phosphatase: 89 U/L (ref 39–117)
Bilirubin, Direct: 0.1 mg/dL (ref 0.0–0.3)
Total Bilirubin: 0.3 mg/dL (ref 0.2–1.2)
Total Protein: 7 g/dL (ref 6.0–8.3)

## 2024-01-22 LAB — CBC WITH DIFFERENTIAL/PLATELET
Basophils Absolute: 0 10*3/uL (ref 0.0–0.1)
Basophils Relative: 0.2 % (ref 0.0–3.0)
Eosinophils Absolute: 0.1 10*3/uL (ref 0.0–0.7)
Eosinophils Relative: 1.1 % (ref 0.0–5.0)
HCT: 42.3 % (ref 36.0–46.0)
Hemoglobin: 14.2 g/dL (ref 12.0–15.0)
Lymphocytes Relative: 19.8 % (ref 12.0–46.0)
Lymphs Abs: 1.6 10*3/uL (ref 0.7–4.0)
MCHC: 33.6 g/dL (ref 30.0–36.0)
MCV: 90.8 fL (ref 78.0–100.0)
Monocytes Absolute: 0.6 10*3/uL (ref 0.1–1.0)
Monocytes Relative: 7.8 % (ref 3.0–12.0)
Neutro Abs: 5.9 10*3/uL (ref 1.4–7.7)
Neutrophils Relative %: 71.1 % (ref 43.0–77.0)
Platelets: 442 10*3/uL — ABNORMAL HIGH (ref 150.0–400.0)
RBC: 4.65 Mil/uL (ref 3.87–5.11)
RDW: 14.5 % (ref 11.5–15.5)
WBC: 8.3 10*3/uL (ref 4.0–10.5)

## 2024-01-22 LAB — BASIC METABOLIC PANEL
BUN: 16 mg/dL (ref 6–23)
CO2: 29 meq/L (ref 19–32)
Calcium: 9.4 mg/dL (ref 8.4–10.5)
Chloride: 99 meq/L (ref 96–112)
Creatinine, Ser: 0.62 mg/dL (ref 0.40–1.20)
GFR: 92.31 mL/min (ref >60.00)
Glucose, Bld: 97 mg/dL (ref 70–99)
Potassium: 4 meq/L (ref 3.5–5.1)
Sodium: 138 meq/L (ref 135–145)

## 2024-01-22 LAB — TSH: TSH: 1.68 u[IU]/mL (ref 0.35–5.50)

## 2024-01-22 NOTE — Progress Notes (Signed)
 Subjective:    Diana Vincent is a 68 y.o. female who presents to the office today for a preoperative consultation at the request of surgeon Aluisio who plans on performing L total knee on  TBD . This consultation is requested for the specific conditions prompting preoperative evaluation (i.e. because of potential affect on operative risk): HTN, obesity. Planned anesthesia:  TBD . The patient has the following known anesthesia issues:  no hx of issues . Patients bleeding risk: no recent abnormal bleeding, no remote history of abnormal bleeding, and use of Ca-channel blockers (see med list). Patient does not have objections to receiving blood products if needed.  The following portions of the patient's history were reviewed and updated as appropriate: allergies, current medications, past family history, past medical history, past social history, past surgical history, and problem list.  Review of Systems A comprehensive review of systems was negative.    Objective:    BP 110/74   Pulse 94   Temp 98 F (36.7 C)   Wt 197 lb 12.8 oz (89.7 kg)   SpO2 97%   BMI 33.95 kg/m   General Appearance:    Alert, cooperative, no distress, appears stated age  Head:    Normocephalic, without obvious abnormality, atraumatic  Eyes:    PERRL, conjunctiva/corneas clear, EOM's intact both eyes  Ears:    Normal TM's and external ear canals, both ears  Nose:   Nares normal, septum midline, mucosa normal, no drainage    or sinus tenderness  Throat:   Lips, mucosa, and tongue normal; teeth and gums normal  Neck:   Supple, symmetrical, trachea midline, no adenopathy;    thyroid :  no enlargement/tenderness/nodules  Back:     Symmetric, no curvature, ROM normal, no CVA tenderness  Lungs:     Clear to auscultation bilaterally, respirations unlabored  Chest Wall:    No tenderness or deformity   Heart:    Regular rate and rhythm, S1 and S2 normal, no murmur, rub   or gallop  Breast Exam:    No tenderness, masses, or  nipple abnormality  Abdomen:     Soft, non-tender, bowel sounds active all four quadrants,    no masses, no organomegaly  Genitalia:    deferred  Rectal:    Extremities:   Extremities normal, atraumatic, no cyanosis or edema  Pulses:   2+ and symmetric all extremities  Skin:   Skin color, texture, turgor normal, no rashes or lesions  Lymph nodes:   Cervical, supraclavicular, and axillary nodes normal  Neurologic:   CNII-XII intact, normal strength, sensation and reflexes    throughout    Predictors of intubation difficulty:  Morbid obesity? no  Anatomically abnormal facies? no  Prominent incisors? no  Receding mandible? no  Short, thick neck? no  Neck range of motion: normal  Dentition: No chipped, loose, or missing teeth.  Cardiographics ECG: unchanged from previous Echocardiogram: not done  Imaging Chest x-ray:  NA    Lab Review  In process    Assessment:      68 y.o. female with planned surgery as above.   Known risk factors for perioperative complications: None   Difficulty with intubation is not anticipated.  Cardiac Risk Estimation: low  Current medications which may produce withdrawal symptoms if withheld perioperatively: none    Plan:    1. Preoperative workup as follows ECG, hemoglobin, hematocrit, electrolytes, creatinine, glucose, liver function studies. 2. Change in medication regimen before surgery: none, continue medication regimen including morning of  surgery, with sip of water . 3. Prophylaxis for cardiac events with perioperative beta-blockers: not indicated. 4. Invasive hemodynamic monitoring perioperatively: at the discretion of anesthesiologist. 5. Deep vein thrombosis prophylaxis postoperatively:regimen to be chosen by surgical team. 6. Surveillance for postoperative MI with ECG immediately postoperatively and on postoperative days 1 and 2 AND troponin levels 24 hours postoperatively and on day 4 or hospital discharge (whichever comes first): at  the discretion of anesthesiologist. 7. Other measures:  none

## 2024-01-22 NOTE — Patient Instructions (Signed)
 Follow up as needed or as scheduled We'll notify you of your lab results and make any changes if needed We'll fax the paperwork to Ortho once your labs are back Keep up the good work!  You look great! Call with any questions or concerns Good Luck with surgery!!!

## 2024-01-24 ENCOUNTER — Encounter: Payer: Self-pay | Admitting: Family Medicine

## 2024-01-25 ENCOUNTER — Telehealth: Payer: Self-pay

## 2024-01-25 NOTE — Telephone Encounter (Signed)
 Pt has been notified.

## 2024-01-25 NOTE — Telephone Encounter (Signed)
-----   Message from Laymon Priest sent at 01/24/2024  4:58 PM EST ----- Labs look great!  No changes at this time

## 2024-02-08 DIAGNOSIS — E669 Obesity, unspecified: Secondary | ICD-10-CM | POA: Diagnosis not present

## 2024-02-08 DIAGNOSIS — F39 Unspecified mood [affective] disorder: Secondary | ICD-10-CM | POA: Diagnosis not present

## 2024-02-08 DIAGNOSIS — F54 Psychological and behavioral factors associated with disorders or diseases classified elsewhere: Secondary | ICD-10-CM | POA: Diagnosis not present

## 2024-02-12 ENCOUNTER — Encounter: Payer: Self-pay | Admitting: Family Medicine

## 2024-02-15 DIAGNOSIS — M25662 Stiffness of left knee, not elsewhere classified: Secondary | ICD-10-CM | POA: Diagnosis not present

## 2024-02-15 DIAGNOSIS — M1712 Unilateral primary osteoarthritis, left knee: Secondary | ICD-10-CM | POA: Diagnosis not present

## 2024-02-15 DIAGNOSIS — M25562 Pain in left knee: Secondary | ICD-10-CM | POA: Diagnosis not present

## 2024-02-18 DIAGNOSIS — D225 Melanocytic nevi of trunk: Secondary | ICD-10-CM | POA: Diagnosis not present

## 2024-02-18 DIAGNOSIS — L814 Other melanin hyperpigmentation: Secondary | ICD-10-CM | POA: Diagnosis not present

## 2024-02-18 DIAGNOSIS — C4362 Malignant melanoma of left upper limb, including shoulder: Secondary | ICD-10-CM | POA: Diagnosis not present

## 2024-02-18 DIAGNOSIS — L218 Other seborrheic dermatitis: Secondary | ICD-10-CM | POA: Diagnosis not present

## 2024-02-18 DIAGNOSIS — D2339 Other benign neoplasm of skin of other parts of face: Secondary | ICD-10-CM | POA: Diagnosis not present

## 2024-02-18 DIAGNOSIS — D485 Neoplasm of uncertain behavior of skin: Secondary | ICD-10-CM | POA: Diagnosis not present

## 2024-02-22 DIAGNOSIS — H353114 Nonexudative age-related macular degeneration, right eye, advanced atrophic with subfoveal involvement: Secondary | ICD-10-CM | POA: Diagnosis not present

## 2024-02-22 DIAGNOSIS — H4051X3 Glaucoma secondary to other eye disorders, right eye, severe stage: Secondary | ICD-10-CM | POA: Diagnosis not present

## 2024-02-22 DIAGNOSIS — H4050X3 Glaucoma secondary to other eye disorders, unspecified eye, severe stage: Secondary | ICD-10-CM | POA: Diagnosis not present

## 2024-02-22 DIAGNOSIS — H43823 Vitreomacular adhesion, bilateral: Secondary | ICD-10-CM | POA: Diagnosis not present

## 2024-02-22 DIAGNOSIS — H353123 Nonexudative age-related macular degeneration, left eye, advanced atrophic without subfoveal involvement: Secondary | ICD-10-CM | POA: Diagnosis not present

## 2024-02-23 ENCOUNTER — Telehealth: Payer: Self-pay | Admitting: Family Medicine

## 2024-02-23 DIAGNOSIS — H43823 Vitreomacular adhesion, bilateral: Secondary | ICD-10-CM | POA: Diagnosis not present

## 2024-02-23 DIAGNOSIS — H353114 Nonexudative age-related macular degeneration, right eye, advanced atrophic with subfoveal involvement: Secondary | ICD-10-CM | POA: Diagnosis not present

## 2024-02-23 DIAGNOSIS — H353123 Nonexudative age-related macular degeneration, left eye, advanced atrophic without subfoveal involvement: Secondary | ICD-10-CM | POA: Diagnosis not present

## 2024-02-23 DIAGNOSIS — H4050X3 Glaucoma secondary to other eye disorders, unspecified eye, severe stage: Secondary | ICD-10-CM | POA: Diagnosis not present

## 2024-02-23 NOTE — Telephone Encounter (Signed)
 [facility] called - stated they received the surgical clearance form but they also needed most recent labs and office notes. I have faxed the surgical clearance along with the labwork that was performed the day she came in for this appointment as well as office notes from that visit. Faxed 3/11 at 11:00am

## 2024-02-29 DIAGNOSIS — D0362 Melanoma in situ of left upper limb, including shoulder: Secondary | ICD-10-CM | POA: Diagnosis not present

## 2024-02-29 DIAGNOSIS — C4362 Malignant melanoma of left upper limb, including shoulder: Secondary | ICD-10-CM | POA: Diagnosis not present

## 2024-02-29 DIAGNOSIS — Z85820 Personal history of malignant melanoma of skin: Secondary | ICD-10-CM | POA: Diagnosis not present

## 2024-03-08 DIAGNOSIS — G8918 Other acute postprocedural pain: Secondary | ICD-10-CM | POA: Diagnosis not present

## 2024-03-08 DIAGNOSIS — M25762 Osteophyte, left knee: Secondary | ICD-10-CM | POA: Diagnosis not present

## 2024-03-08 DIAGNOSIS — M1712 Unilateral primary osteoarthritis, left knee: Secondary | ICD-10-CM | POA: Diagnosis not present

## 2024-03-10 DIAGNOSIS — M25562 Pain in left knee: Secondary | ICD-10-CM | POA: Diagnosis not present

## 2024-03-14 DIAGNOSIS — M25562 Pain in left knee: Secondary | ICD-10-CM | POA: Diagnosis not present

## 2024-03-16 DIAGNOSIS — M25562 Pain in left knee: Secondary | ICD-10-CM | POA: Diagnosis not present

## 2024-03-21 DIAGNOSIS — M25562 Pain in left knee: Secondary | ICD-10-CM | POA: Diagnosis not present

## 2024-03-23 DIAGNOSIS — M25562 Pain in left knee: Secondary | ICD-10-CM | POA: Diagnosis not present

## 2024-03-28 DIAGNOSIS — M25562 Pain in left knee: Secondary | ICD-10-CM | POA: Diagnosis not present

## 2024-03-30 DIAGNOSIS — M25562 Pain in left knee: Secondary | ICD-10-CM | POA: Diagnosis not present

## 2024-04-04 DIAGNOSIS — M25562 Pain in left knee: Secondary | ICD-10-CM | POA: Diagnosis not present

## 2024-04-04 DIAGNOSIS — Z1330 Encounter for screening examination for mental health and behavioral disorders, unspecified: Secondary | ICD-10-CM | POA: Diagnosis not present

## 2024-04-04 DIAGNOSIS — I10 Essential (primary) hypertension: Secondary | ICD-10-CM | POA: Diagnosis not present

## 2024-04-04 DIAGNOSIS — E669 Obesity, unspecified: Secondary | ICD-10-CM | POA: Diagnosis not present

## 2024-04-04 DIAGNOSIS — E538 Deficiency of other specified B group vitamins: Secondary | ICD-10-CM | POA: Diagnosis not present

## 2024-04-05 DIAGNOSIS — H353123 Nonexudative age-related macular degeneration, left eye, advanced atrophic without subfoveal involvement: Secondary | ICD-10-CM | POA: Diagnosis not present

## 2024-04-05 DIAGNOSIS — H4050X3 Glaucoma secondary to other eye disorders, unspecified eye, severe stage: Secondary | ICD-10-CM | POA: Diagnosis not present

## 2024-04-05 DIAGNOSIS — H353114 Nonexudative age-related macular degeneration, right eye, advanced atrophic with subfoveal involvement: Secondary | ICD-10-CM | POA: Diagnosis not present

## 2024-04-05 DIAGNOSIS — H43823 Vitreomacular adhesion, bilateral: Secondary | ICD-10-CM | POA: Diagnosis not present

## 2024-04-05 DIAGNOSIS — H53122 Transient visual loss, left eye: Secondary | ICD-10-CM | POA: Diagnosis not present

## 2024-04-06 DIAGNOSIS — Z85820 Personal history of malignant melanoma of skin: Secondary | ICD-10-CM | POA: Diagnosis not present

## 2024-04-06 DIAGNOSIS — M25562 Pain in left knee: Secondary | ICD-10-CM | POA: Diagnosis not present

## 2024-04-06 DIAGNOSIS — D485 Neoplasm of uncertain behavior of skin: Secondary | ICD-10-CM | POA: Diagnosis not present

## 2024-04-06 DIAGNOSIS — L988 Other specified disorders of the skin and subcutaneous tissue: Secondary | ICD-10-CM | POA: Diagnosis not present

## 2024-04-11 DIAGNOSIS — M25562 Pain in left knee: Secondary | ICD-10-CM | POA: Diagnosis not present

## 2024-04-13 ENCOUNTER — Other Ambulatory Visit: Payer: Self-pay | Admitting: Family Medicine

## 2024-04-13 DIAGNOSIS — M25562 Pain in left knee: Secondary | ICD-10-CM | POA: Diagnosis not present

## 2024-04-19 DIAGNOSIS — M25562 Pain in left knee: Secondary | ICD-10-CM | POA: Diagnosis not present

## 2024-04-21 DIAGNOSIS — M25562 Pain in left knee: Secondary | ICD-10-CM | POA: Diagnosis not present

## 2024-04-25 DIAGNOSIS — M25562 Pain in left knee: Secondary | ICD-10-CM | POA: Diagnosis not present

## 2024-04-26 DIAGNOSIS — Z5189 Encounter for other specified aftercare: Secondary | ICD-10-CM | POA: Diagnosis not present

## 2024-04-27 ENCOUNTER — Other Ambulatory Visit: Payer: Self-pay | Admitting: Family Medicine

## 2024-04-27 DIAGNOSIS — M25562 Pain in left knee: Secondary | ICD-10-CM | POA: Diagnosis not present

## 2024-05-02 DIAGNOSIS — M25562 Pain in left knee: Secondary | ICD-10-CM | POA: Diagnosis not present

## 2024-05-05 DIAGNOSIS — M25562 Pain in left knee: Secondary | ICD-10-CM | POA: Diagnosis not present

## 2024-05-13 DIAGNOSIS — M25562 Pain in left knee: Secondary | ICD-10-CM | POA: Diagnosis not present

## 2024-05-19 DIAGNOSIS — M25562 Pain in left knee: Secondary | ICD-10-CM | POA: Diagnosis not present

## 2024-05-23 DIAGNOSIS — M25562 Pain in left knee: Secondary | ICD-10-CM | POA: Diagnosis not present

## 2024-05-24 DIAGNOSIS — H53122 Transient visual loss, left eye: Secondary | ICD-10-CM | POA: Diagnosis not present

## 2024-05-24 DIAGNOSIS — H4050X3 Glaucoma secondary to other eye disorders, unspecified eye, severe stage: Secondary | ICD-10-CM | POA: Diagnosis not present

## 2024-05-24 DIAGNOSIS — H43823 Vitreomacular adhesion, bilateral: Secondary | ICD-10-CM | POA: Diagnosis not present

## 2024-05-24 DIAGNOSIS — H353114 Nonexudative age-related macular degeneration, right eye, advanced atrophic with subfoveal involvement: Secondary | ICD-10-CM | POA: Diagnosis not present

## 2024-05-24 DIAGNOSIS — H353123 Nonexudative age-related macular degeneration, left eye, advanced atrophic without subfoveal involvement: Secondary | ICD-10-CM | POA: Diagnosis not present

## 2024-06-12 ENCOUNTER — Other Ambulatory Visit: Payer: Self-pay | Admitting: Family Medicine

## 2024-06-12 DIAGNOSIS — I10 Essential (primary) hypertension: Secondary | ICD-10-CM

## 2024-06-20 ENCOUNTER — Other Ambulatory Visit: Payer: Self-pay

## 2024-06-20 ENCOUNTER — Other Ambulatory Visit: Payer: Self-pay | Admitting: Family Medicine

## 2024-06-20 DIAGNOSIS — I10 Essential (primary) hypertension: Secondary | ICD-10-CM

## 2024-06-20 MED ORDER — AMLODIPINE BESYLATE 5 MG PO TABS: ORAL_TABLET | ORAL | 1 refills | Status: DC

## 2024-06-20 NOTE — Telephone Encounter (Signed)
 Copied from CRM [id number]. Topic: Clinical - Prescription Issue >> Jun 20, 2024 12:32 PM Diana Vincent wrote: Reason for CRM: Patient called in regarding the status of prescription amLODipine  (NORVASC ) 5 MG tablet , informed her that it is pending, patient stated that she is out of medication and needs it as soon as possible

## 2024-06-27 DIAGNOSIS — E538 Deficiency of other specified B group vitamins: Secondary | ICD-10-CM | POA: Diagnosis not present

## 2024-06-27 DIAGNOSIS — I10 Essential (primary) hypertension: Secondary | ICD-10-CM | POA: Diagnosis not present

## 2024-06-27 DIAGNOSIS — E669 Obesity, unspecified: Secondary | ICD-10-CM | POA: Diagnosis not present

## 2024-06-27 DIAGNOSIS — F54 Psychological and behavioral factors associated with disorders or diseases classified elsewhere: Secondary | ICD-10-CM | POA: Diagnosis not present

## 2024-06-30 ENCOUNTER — Other Ambulatory Visit: Payer: Self-pay

## 2024-06-30 DIAGNOSIS — I10 Essential (primary) hypertension: Secondary | ICD-10-CM

## 2024-06-30 MED ORDER — OLMESARTAN MEDOXOMIL-HCTZ 40-12.5 MG PO TABS: 1.0000 | ORAL_TABLET | Freq: Every day | ORAL | 1 refills | Status: DC

## 2024-07-12 DIAGNOSIS — H353114 Nonexudative age-related macular degeneration, right eye, advanced atrophic with subfoveal involvement: Secondary | ICD-10-CM | POA: Diagnosis not present

## 2024-07-12 DIAGNOSIS — H53122 Transient visual loss, left eye: Secondary | ICD-10-CM | POA: Diagnosis not present

## 2024-07-12 DIAGNOSIS — H4050X3 Glaucoma secondary to other eye disorders, unspecified eye, severe stage: Secondary | ICD-10-CM | POA: Diagnosis not present

## 2024-07-12 DIAGNOSIS — H43823 Vitreomacular adhesion, bilateral: Secondary | ICD-10-CM | POA: Diagnosis not present

## 2024-07-12 DIAGNOSIS — H353123 Nonexudative age-related macular degeneration, left eye, advanced atrophic without subfoveal involvement: Secondary | ICD-10-CM | POA: Diagnosis not present

## 2024-07-21 DIAGNOSIS — L821 Other seborrheic keratosis: Secondary | ICD-10-CM | POA: Diagnosis not present

## 2024-07-21 DIAGNOSIS — D225 Melanocytic nevi of trunk: Secondary | ICD-10-CM | POA: Diagnosis not present

## 2024-07-21 DIAGNOSIS — C44319 Basal cell carcinoma of skin of other parts of face: Secondary | ICD-10-CM | POA: Diagnosis not present

## 2024-07-21 DIAGNOSIS — Z85828 Personal history of other malignant neoplasm of skin: Secondary | ICD-10-CM | POA: Diagnosis not present

## 2024-07-21 DIAGNOSIS — D1801 Hemangioma of skin and subcutaneous tissue: Secondary | ICD-10-CM | POA: Diagnosis not present

## 2024-07-21 DIAGNOSIS — L814 Other melanin hyperpigmentation: Secondary | ICD-10-CM | POA: Diagnosis not present

## 2024-07-21 DIAGNOSIS — L2989 Other pruritus: Secondary | ICD-10-CM | POA: Diagnosis not present

## 2024-07-21 DIAGNOSIS — D485 Neoplasm of uncertain behavior of skin: Secondary | ICD-10-CM | POA: Diagnosis not present

## 2024-07-21 DIAGNOSIS — Z85820 Personal history of malignant melanoma of skin: Secondary | ICD-10-CM | POA: Diagnosis not present

## 2024-07-23 DIAGNOSIS — M791 Myalgia, unspecified site: Secondary | ICD-10-CM | POA: Diagnosis not present

## 2024-07-23 DIAGNOSIS — R051 Acute cough: Secondary | ICD-10-CM | POA: Diagnosis not present

## 2024-07-23 DIAGNOSIS — R5381 Other malaise: Secondary | ICD-10-CM | POA: Diagnosis not present

## 2024-07-23 DIAGNOSIS — J029 Acute pharyngitis, unspecified: Secondary | ICD-10-CM | POA: Diagnosis not present

## 2024-07-23 DIAGNOSIS — U071 COVID-19: Secondary | ICD-10-CM | POA: Diagnosis not present

## 2024-07-23 DIAGNOSIS — R519 Headache, unspecified: Secondary | ICD-10-CM | POA: Diagnosis not present

## 2024-07-26 ENCOUNTER — Other Ambulatory Visit: Payer: Self-pay | Admitting: Family Medicine

## 2024-07-26 DIAGNOSIS — I10 Essential (primary) hypertension: Secondary | ICD-10-CM

## 2024-07-26 MED ORDER — AMLODIPINE BESYLATE 5 MG PO TABS: ORAL_TABLET | ORAL | 1 refills | Status: DC

## 2024-07-26 MED ORDER — OLMESARTAN MEDOXOMIL-HCTZ 40-12.5 MG PO TABS: 1.0000 | ORAL_TABLET | Freq: Every day | ORAL | 1 refills | Status: DC

## 2024-07-26 NOTE — Telephone Encounter (Signed)
 Requested Prescriptions   Pending Prescriptions Disp Refills   zolpidem  (AMBIEN ) 10 MG tablet 30 tablet 3    Sig: TAKE 1 TABLET(10 MG) BY MOUTH AT BEDTIME AS NEEDED FOR SLEEP   Signed Prescriptions Disp Refills   amLODipine  (NORVASC ) 5 MG tablet 90 tablet 1    Sig: TAKE 1 TABLET(5 MG) BY MOUTH DAILY    Authorizing Provider: GREENE, JEFFREY R    Ordering User: Kresta Templeman K   olmesartan -hydrochlorothiazide  (BENICAR  HCT) 40-12.5 MG tablet 90 tablet 1    Sig: Take 1 tablet by mouth daily.    Authorizing Provider: LEVORA REYES SAUNDERS    Ordering User: Fareeha Evon K     Date of patient request: 07/26/2024 Last office visit: 01/22/2024 Upcoming visit: Visit date not found Date of last refill: 04/27/2024 Last refill amount: 30

## 2024-07-26 NOTE — Telephone Encounter (Signed)
 Copied from CRM [id number]. Topic: Clinical - Medication Question >> Jul 26, 2024  3:12 PM Leah C wrote: Reason for CRM: Patient would like to know what if she needs to come in for an office visit for a med refill or if she can just submit the refill for zolpidem  (AMBIEN ) 10 MG tablet and blood pressure meds. She will going out of the town the last week of September. Patient would like to get ahead of the refills so she can have meds to last her for the two weeks while she is out of town.   Patient is having issues with the zolpidem  and just wants to make ensure she doesn't run into any issues.   [phone] (H) Patient contact. Patient would appreciate a call back.

## 2024-07-27 MED ORDER — ZOLPIDEM TARTRATE 10 MG PO TABS: ORAL_TABLET | ORAL | 3 refills | Status: DC

## 2024-08-01 DIAGNOSIS — L988 Other specified disorders of the skin and subcutaneous tissue: Secondary | ICD-10-CM | POA: Diagnosis not present

## 2024-08-01 DIAGNOSIS — Z85820 Personal history of malignant melanoma of skin: Secondary | ICD-10-CM | POA: Diagnosis not present

## 2024-08-01 DIAGNOSIS — Z85828 Personal history of other malignant neoplasm of skin: Secondary | ICD-10-CM | POA: Diagnosis not present

## 2024-08-01 DIAGNOSIS — D485 Neoplasm of uncertain behavior of skin: Secondary | ICD-10-CM | POA: Diagnosis not present

## 2024-08-03 MED ORDER — ZOLPIDEM TARTRATE 10 MG PO TABS: ORAL_TABLET | ORAL | 3 refills | Status: DC

## 2024-08-03 MED ORDER — BUPROPION HCL ER (XL) 150 MG PO TB24: 150.0000 mg | ORAL_TABLET | Freq: Every day | ORAL | 3 refills | Status: DC

## 2024-08-03 NOTE — Telephone Encounter (Signed)
 Copied from CRM #[id number]. Topic: Clinical - Medication Question >> Aug 03, 2024  9:31 AM Berneda FALCON wrote: Reason for CRM: Patient states she is going out of town on 9/22 and she is calling ahead of time to ensure she is not going to run out of her medications while she is out. She sent the first request about this on 8/12 and wants to be sure this is being addressed. Can we please confirm this for her?  [id number] - [place], [place] - [address] AT [facility] OF [address] & [address] [place] [address] Phone: [phone] Fax: [phone] Hours: Not open 24 hours  Patient callback is [phone] if she does not answer, please leave a vm for her.

## 2024-08-03 NOTE — Telephone Encounter (Signed)
 Called patient to verify which medications she was needing. Patient stating she is leaving out of the country on 9/22 and not returning until 10/15. Patient would like to have enough medications to last her until she returns back to the country. Patient also wanted to know if Dr.Tabori could write a note to the pharmacy stating it is okay for patient to pick these medications up before refill is due?    Requested Prescriptions   Pending Prescriptions Disp Refills   buPROPion  (WELLBUTRIN  XL) 150 MG 24 hr tablet 30 tablet 3    Sig: Take 1 tablet (150 mg total) by mouth daily.   zolpidem  (AMBIEN ) 10 MG tablet 30 tablet 3    Sig: TAKE 1 TABLET(10 MG) BY MOUTH AT BEDTIME AS NEEDED FOR SLEEP   Signed Prescriptions Disp Refills   zolpidem  (AMBIEN ) 10 MG tablet 30 tablet 3    Sig: TAKE 1 TABLET(10 MG) BY MOUTH AT BEDTIME AS NEEDED FOR SLEEP    Authorizing Provider: TABORI, KATHERINE E   amLODipine  (NORVASC ) 5 MG tablet 90 tablet 1    Sig: TAKE 1 TABLET(5 MG) BY MOUTH DAILY    Authorizing Provider: GREENE, JEFFREY R    Ordering User: HERRST, MACKENZIE K   olmesartan -hydrochlorothiazide  (BENICAR  HCT) 40-12.5 MG tablet 90 tablet 1    Sig: Take 1 tablet by mouth daily.    Authorizing Provider: LEVORA REYES SAUNDERS    Ordering User: HERRST, MACKENZIE K     Date of patient request: 08/03/2024 Last office visit: 01/22/2024 Upcoming visit: Visit date not found Date of last refill: 8/12-8/13 Last refill amount: 30 tabs 3 refills and 90 tabs 1 refill

## 2024-08-04 DIAGNOSIS — M1711 Unilateral primary osteoarthritis, right knee: Secondary | ICD-10-CM | POA: Diagnosis not present

## 2024-08-11 DIAGNOSIS — M1711 Unilateral primary osteoarthritis, right knee: Secondary | ICD-10-CM | POA: Diagnosis not present

## 2024-08-18 DIAGNOSIS — M1711 Unilateral primary osteoarthritis, right knee: Secondary | ICD-10-CM | POA: Diagnosis not present

## 2024-08-24 DIAGNOSIS — H53122 Transient visual loss, left eye: Secondary | ICD-10-CM | POA: Diagnosis not present

## 2024-08-24 DIAGNOSIS — H353114 Nonexudative age-related macular degeneration, right eye, advanced atrophic with subfoveal involvement: Secondary | ICD-10-CM | POA: Diagnosis not present

## 2024-08-24 DIAGNOSIS — H43823 Vitreomacular adhesion, bilateral: Secondary | ICD-10-CM | POA: Diagnosis not present

## 2024-08-24 DIAGNOSIS — H4050X3 Glaucoma secondary to other eye disorders, unspecified eye, severe stage: Secondary | ICD-10-CM | POA: Diagnosis not present

## 2024-08-24 DIAGNOSIS — H353123 Nonexudative age-related macular degeneration, left eye, advanced atrophic without subfoveal involvement: Secondary | ICD-10-CM | POA: Diagnosis not present

## 2024-08-26 ENCOUNTER — Ambulatory Visit: Admitting: Family Medicine

## 2024-08-26 ENCOUNTER — Encounter: Payer: Self-pay | Admitting: Family Medicine

## 2024-08-26 VITALS — BP 110/70 | HR 90 | Temp 98.2°F | Ht 64.0 in | Wt 191.0 lb

## 2024-08-26 DIAGNOSIS — L299 Pruritus, unspecified: Secondary | ICD-10-CM

## 2024-08-26 MED ORDER — HYDROXYZINE HCL 50 MG PO TABS: 50.0000 mg | ORAL_TABLET | Freq: Three times a day (TID) | ORAL | 0 refills | Status: DC | PRN

## 2024-08-26 MED ORDER — BUPROPION HCL ER (XL) 150 MG PO TB24: 150.0000 mg | ORAL_TABLET | Freq: Every day | ORAL | 3 refills | Status: DC

## 2024-08-26 MED ORDER — ZOLPIDEM TARTRATE 10 MG PO TABS: ORAL_TABLET | ORAL | 3 refills | Status: DC

## 2024-08-26 NOTE — Patient Instructions (Addendum)
 Follow up as needed or as scheduled HOLD the Phentermine to see if the itching stops Let me know via MyChart if the itching improves off the medication Once you're back from your [place] adventure- let me know if you want to adjust the Wellbutrin  Call with any questions or concerns Stay Safe!  Stay Healthy! ENJOY!!!

## 2024-08-26 NOTE — Progress Notes (Signed)
   Subjective:    Patient ID: Diana Vincent, female    DOB: 15-Jul-1956, 68 y.o.   MRN: [id number]  HPI Itching- pt reports scalp, face, neck, and upper shoulders are all very itchy.  Sxs started ~6-8 months ago.  No improvement w/ changing shampoos.  Pt feels that this may be related to Phentermine (googled this).  Stopped the Phentermine on Wednesday.  Possibly some improvement but hard to say.  Dermatologist and hair dresser had no suggestions.  At some point would like to decrease Wellbutrin  dose but not until after she returns from upcoming trip to [place].   Review of Systems For ROS see HPI     Objective:   Physical Exam Vitals reviewed.  Constitutional:      General: She is not in acute distress.    Appearance: Normal appearance. She is not ill-appearing.  HENT:     Head: Normocephalic and atraumatic.  Skin:    General: Skin is warm and dry.     Findings: No erythema or rash (no obvious rash).  Neurological:     General: No focal deficit present.     Mental Status: She is alert and oriented to person, place, and time.  Psychiatric:        Mood and Affect: Mood normal.        Behavior: Behavior normal.        Thought Content: Thought content normal.           Assessment & Plan:  Severe itching- new.  Pt reports this has been going on for months.  The only new thing or change in her routine was starting Phentermine.  She was off phentermine earlier this summer for a procedure and feels that maybe itching improved during that time.  Will continue to hold phentermine and monitor.  Will provide hydroxyzine  to use PRN.  Also discussed that Wellbutrin  can cause itching but she doesn't want to make adjustments to that until she returns from [place].

## 2024-08-31 ENCOUNTER — Encounter: Payer: Self-pay | Admitting: Family Medicine

## 2024-09-01 DIAGNOSIS — J019 Acute sinusitis, unspecified: Secondary | ICD-10-CM | POA: Diagnosis not present

## 2024-09-01 DIAGNOSIS — J3489 Other specified disorders of nose and nasal sinuses: Secondary | ICD-10-CM | POA: Diagnosis not present

## 2024-09-26 ENCOUNTER — Encounter: Payer: Self-pay | Admitting: Student in an Organized Health Care Education/Training Program

## 2024-09-26 ENCOUNTER — Ambulatory Visit: Admitting: Student in an Organized Health Care Education/Training Program

## 2024-09-26 VITALS — BP 114/66 | HR 86 | Wt 194.0 lb

## 2024-09-26 DIAGNOSIS — R55 Syncope and collapse: Secondary | ICD-10-CM | POA: Insufficient documentation

## 2024-09-26 DIAGNOSIS — E611 Iron deficiency: Secondary | ICD-10-CM | POA: Diagnosis not present

## 2024-09-26 NOTE — Addendum Note (Signed)
 Addended by: JERRELL SOLIAN T on: 09/26/2024 01:08 PM   Modules accepted: Level of Service

## 2024-09-26 NOTE — Assessment & Plan Note (Signed)
 Clinical history sounds consistent with an episode of vasovagal syncope.  Probably provoked by prolonged standing prior to the event.  Her exam today is reassuring.  No signs of orthostatic hypotension, but I am going to discontinue amlodipine  and continue with Benicar  alone.  Perhaps her blood pressure control is just a little too tight right now.  EKG was reassuring with no signs of heart strain, normal QT interval.  Will check blood work today to rule out new anemia or metabolic causes.  She is using lamotrigine , will check a lamotrigine  level as toxicity can be associated with syncope, I expect to see the level less than 10.  Last on the differential is VTE.  She did have a flight home from [place] about a week prior to the symptoms.  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  Exam is reasonable today, only minimal swelling in the left leg.  No symptoms or signs to suggest a large pulmonary embolism.  Wells score is low risk.  Will check a D-dimer today.  If it is elevated we will do CT angio.  If the D-dimer is normal, I think VTE is ruled out given her low risk.  We talked about vasovagal syncope, diagnosis of exclusion, but seems most likely.  We talked about supportive care and precautions to use in the coming days to avoid further episodes.

## 2024-09-26 NOTE — Progress Notes (Addendum)
 Acute Office Visit  Subjective:     Patient ID: Diana Vincent, female    DOB: 23-Jul-1956, 68 y.o.   MRN: [id number]  Chief Complaint  Patient presents with   Excessive Sweating    Just sitting in [employer] 2 oclock yesterday afternoon middle of back was very painful, stomach pain, cold sweats, almost passed out, pain back of neck,cold and clammy.  Diarrhea as well. BP was 110/60.     HPI  Discussed the use of AI scribe software for clinical note transcription with the patient, who gave verbal consent to proceed.  History of Present Illness Diana Vincent is a 68 year old female who presents with an episode of near syncope and diaphoresis.  She experienced a sudden episode of severe back pain across the middle of her back while sitting at a table in [employer] around 2 PM yesterday. This was accompanied by a sensation of impending syncope, with her vision 'closing in', and she felt she could hardly sit up. She experienced profuse sweating, feeling cold and clammy, with sweat rolling down her back and forehead. She did not lose consciousness but had to lay her head down on the table and was unable to move for about 20-30 minutes.  Following the episode, she managed to get up and go to the bathroom, where she experienced diarrhea. She noted that she did not quite make it to the bathroom in time. After returning home and resting, she measured her blood pressure, which was 110/60 mmHg. She felt exhausted and noted that her shirt was damp from sweat. She has never experienced anything similar, not even during childbirth or previous gallbladder attacks.  She recently returned from a 12-day [place] cruise, arriving back last Saturday from [place]. She has been feeling tired since the trip, attributing it to time changes and travel. No chest pain or breathing difficulties. She reports feeling tired today with a headache but otherwise feels back to normal. No recent changes in her medications, which  include blood pressure medications, and she is certain she took them correctly.  Her past medical history includes a gallbladder removal approximately 15 years ago and a knee replacement surgery at the end of March. She is preparing for another knee replacement. She has noticed some swelling in her leg since the surgery, with a ridge forming around her socks, but this is consistent with her post-surgical condition. She has intentionally lost about 25 pounds since January, although she gained some weight back during the recent trip.      Objective:    BP 114/66 (BP Location: Left Arm, Cuff Size: Normal)   Pulse 86   Wt 194 lb (88 kg)   SpO2 99%   BMI 33.30 kg/m   Physical Exam  Gen: Well-appearing woman Neck: Normal thyroid , no adenopathy, no JVD Heart: Regular, no murmur Lungs: Unlabored, clear throughout Abd: Soft and nontender, no organomegaly, no palpable mass Ext: Warm, left knee replacements from about 6 months ago, trace edema on the left lower leg, only slightly more than the trace edema on the right lower leg.  She has telangiectasias in both legs.  EKG: Obtained to evaluate syncope.  EKG is normal sinus rhythm, left axis deviation, no Q waves, no T wave inversions, no ST changes.     Assessment & Plan:   Problem List Items Addressed This Visit       Unprioritized   Syncope - Primary   Clinical history sounds consistent with an episode of vasovagal syncope.  Probably provoked by prolonged standing prior to the event.  Her exam today is reassuring.  No signs of orthostatic hypotension, but I am going to discontinue amlodipine  and continue with Benicar  alone.  Perhaps her blood pressure control is just a little too tight right now.  EKG was reassuring with no signs of heart strain, normal QT interval.  Will check blood work today to rule out new anemia or metabolic causes.  She is using lamotrigine , will check a lamotrigine  level as toxicity can be associated with syncope, I  expect to see the level less than 10.  Last on the differential is VTE.  She did have a flight home from [place] about a week prior to the symptoms.  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  Exam is reasonable today, only minimal swelling in the left leg.  No symptoms or signs to suggest a large pulmonary embolism.  Wells score is low risk.  Will check a D-dimer today.  If it is elevated we will do CT angio.  If the D-dimer is normal, I think VTE is ruled out given her low risk.  We talked about vasovagal syncope, diagnosis of exclusion, but seems most likely.  We talked about supportive care and precautions to use in the coming days to avoid further episodes.      Relevant Orders   EKG 12-Lead   CBC   Magnesium   D-Dimer, Quantitative   Comprehensive metabolic panel with GFR   Lamotrigine  level    Return if symptoms worsen or fail to improve.  Cleatus Debby Specking, MD

## 2024-09-26 NOTE — Patient Instructions (Signed)
  VISIT SUMMARY: Today, you were seen for an episode of near fainting and sweating that occurred yesterday. You experienced severe back pain, felt like you were going to faint, and had profuse sweating. After resting, you felt better but were very tired. You have a history of high blood pressure and recent knee surgery. You recently returned from a trip and have been feeling tired since then.  YOUR PLAN: -VASOVAGAL SYNCOPE (NEAR SYNCOPE): Vasovagal syncope is a condition where you feel faint due to a sudden drop in heart rate and blood pressure. Your episode included back pain, sweating, and lightheadedness. We will perform an EKG and blood tests to check your heart and blood levels. You should temporarily stop taking amlodipine  and monitor your blood pressure closely.  -HYPERTENSION: Hypertension is high blood pressure. Your recent low blood pressure might be due to dehydration or your medication. We have asked you to stop taking amlodipine  for now and will reassess your blood pressure management after we get your test results.  INSTRUCTIONS: Please get an EKG and blood work done as soon as possible. Monitor your blood pressure closely at home and keep a record of your readings. Follow up with us  after your test results are available to reassess your blood pressure management.

## 2024-09-27 ENCOUNTER — Ambulatory Visit (HOSPITAL_BASED_OUTPATIENT_CLINIC_OR_DEPARTMENT_OTHER)
Admission: RE | Admit: 2024-09-27 | Discharge: 2024-09-27 | Disposition: A | Discharge status: Home / Self Care | Source: Ambulatory Visit | Attending: Student in an Organized Health Care Education/Training Program | Admitting: Student in an Organized Health Care Education/Training Program

## 2024-09-27 ENCOUNTER — Ambulatory Visit: Payer: Self-pay | Admitting: Student in an Organized Health Care Education/Training Program

## 2024-09-27 DIAGNOSIS — R55 Syncope and collapse: Secondary | ICD-10-CM

## 2024-09-27 DIAGNOSIS — E611 Iron deficiency: Secondary | ICD-10-CM

## 2024-09-27 MED ORDER — IOHEXOL 350 MG/ML SOLN
75.0000 mL | Freq: Once | INTRAVENOUS | Status: AC | PRN
  Administered 2024-09-27: 75 mL via INTRAVENOUS

## 2024-09-27 NOTE — Addendum Note (Signed)
 Addended by: JERRELL SOLIAN T on: 09/27/2024 07:55 AM   Modules accepted: Orders

## 2024-09-27 NOTE — Telephone Encounter (Signed)
 Per Dr. Jerrell via Teams message to [facility]: MRN: [id number] can we please add iron studies for this patient to blood work drawn yesterday  Blood work was future ordered by [facility] and lab add on form was faxed to [place] lab.

## 2024-09-28 ENCOUNTER — Ambulatory Visit: Payer: Self-pay | Admitting: Student in an Organized Health Care Education/Training Program

## 2024-09-28 LAB — CBC
HCT: 43.9 % (ref 35.0–45.0)
Hemoglobin: 14.2 g/dL (ref 11.7–15.5)
MCH: 30.1 pg (ref 27.0–33.0)
MCHC: 32.3 g/dL (ref 32.0–36.0)
MCV: 93.2 fL (ref 80.0–100.0)
MPV: 9.9 fL (ref 7.5–12.5)
Platelets: 409 Thousand/uL — ABNORMAL HIGH (ref 140–400)
RBC: 4.71 Million/uL (ref 3.80–5.10)
RDW: 13.7 % (ref 11.0–15.0)
WBC: 7.3 Thousand/uL (ref 3.8–10.8)

## 2024-09-28 LAB — COMPREHENSIVE METABOLIC PANEL WITH GFR
AG Ratio: 1.8 (calc) (ref 1.0–2.5)
ALT: 18 U/L (ref 6–29)
AST: 15 U/L (ref 10–35)
Albumin: 4.1 g/dL (ref 3.6–5.1)
Alkaline phosphatase (APISO): 76 U/L (ref 37–153)
BUN: 14 mg/dL (ref 7–25)
CO2: 30 mmol/L (ref 20–32)
Calcium: 9.4 mg/dL (ref 8.6–10.4)
Chloride: 100 mmol/L (ref 98–110)
Creat: 0.6 mg/dL (ref 0.50–1.05)
Globulin: 2.3 g/dL (ref 1.9–3.7)
Glucose, Bld: 85 mg/dL (ref 65–99)
Potassium: 4.7 mmol/L (ref 3.5–5.3)
Sodium: 138 mmol/L (ref 135–146)
Total Bilirubin: 0.4 mg/dL (ref 0.2–1.2)
Total Protein: 6.4 g/dL (ref 6.1–8.1)
eGFR: 98 mL/min/1.73m2 (ref >=60)

## 2024-09-28 LAB — MAGNESIUM: Magnesium: 2.2 mg/dL (ref 1.5–2.5)

## 2024-09-28 LAB — D-DIMER, QUANTITATIVE: D-Dimer, Quant: 1.75 ug{FEU}/mL — ABNORMAL HIGH (ref <0.50)

## 2024-09-28 LAB — LAMOTRIGINE LEVEL: Lamotrigine Lvl: <0.5 ug/mL — ABNORMAL LOW (ref 2.5–15.0)

## 2024-10-04 ENCOUNTER — Ambulatory Visit (INDEPENDENT_AMBULATORY_CARE_PROVIDER_SITE_OTHER)

## 2024-10-04 VITALS — BP 124/84 | Ht 64.0 in | Wt 194.0 lb

## 2024-10-04 DIAGNOSIS — Z0000 Encounter for general adult medical examination without abnormal findings: Secondary | ICD-10-CM | POA: Diagnosis not present

## 2024-10-04 NOTE — Progress Notes (Signed)
 Because this visit was a virtual/telehealth visit,  certain criteria was not obtained, such a blood pressure, CBG if applicable, and timed get up and go. Any medications not marked as taking were not mentioned during the medication reconciliation part of the visit. Any vitals not documented were not able to be obtained due to this being a telehealth visit or patient was unable to self-report a recent blood pressure reading due to a lack of equipment at home via telehealth. Vitals that have been documented are verbally provided by the patient.  This visit was performed by a medical professional under my direct supervision. I was immediately available for consultation/collaboration. I have reviewed and agree with the Annual Wellness Visit documentation.  Subjective:   Diana Vincent is a 68 y.o. who presents for a Medicare Wellness preventive visit.  As a reminder, Annual Wellness Visits don't include a physical exam, and some assessments may be limited, especially if this visit is performed virtually. We may recommend an in-person follow-up visit with your provider if needed.  Visit Complete: Virtual I connected with  Diana Vincent on 10/04/24 by a audio enabled telemedicine application and verified that I am speaking with the correct person using two identifiers.  Patient Location: Home  Provider Location: Home Office  I discussed the limitations of evaluation and management by telemedicine. The patient expressed understanding and agreed to proceed.  Vital Signs: Because this visit was a virtual/telehealth visit, some criteria may be missing or patient reported. Any vitals not documented were not able to be obtained and vitals that have been documented are patient reported.  VideoDeclined- This patient declined Librarian, academic. Therefore the visit was completed with audio only.  Persons Participating in Visit: Patient.  AWV Questionnaire: No: Patient Medicare AWV  questionnaire was not completed prior to this visit.  Cardiac Risk Factors include: advanced age (>36men, >68 women);female gender;obesity (BMI >30kg/m2);hypertension     Objective:    Today's Vitals   10/04/24 0954  BP: 124/84  Weight: 194 lb (88 kg)  Height: 5' 4 (1.626 m)   Body mass index is 33.3 kg/m.     10/04/2024    9:53 AM 07/30/2023   10:01 AM 01/29/2023   10:09 AM 09/24/2017    2:04 PM 07/07/2012    2:00 PM 07/01/2012   11:40 AM  Advanced Directives  Does Patient Have a Medical Advance Directive? No No Yes Yes  Patient does not have advance directive  Patient does not have advance directive   Type of Advance Directive   Living will Living will;Healthcare Power of Attorney    Would patient like information on creating a medical advance directive? No - Patient declined No - Patient declined      Pre-existing out of facility DNR order (yellow form or pink MOST form)     No       Data saved with a previous flowsheet row definition    Current Medications (verified) Outpatient Encounter Medications as of 10/04/2024  Medication Sig   Biotin 5 MG CAPS Take by mouth daily.   buPROPion  (WELLBUTRIN  XL) 150 MG 24 hr tablet Take 1 tablet (150 mg total) by mouth daily.   Cholecalciferol (D3 ADULT PO) Take by mouth daily.   hydrOXYzine  (ATARAX ) 50 MG tablet Take 1 tablet (50 mg total) by mouth 3 (three) times daily as needed.   lamoTRIgine  (LAMICTAL ) 100 MG tablet TAKE 1 TABLET(100 MG) BY MOUTH AT BEDTIME   Loratadine (CLARITIN PO) Take by mouth daily.  Multiple Vitamins-Minerals (PRESERVISION AREDS PO) Take 2 tablets by mouth daily.   MYRBETRIQ  50 MG TB24 tablet Take 50 mg by mouth daily.   olmesartan -hydrochlorothiazide  (BENICAR  HCT) 40-12.5 MG tablet Take 1 tablet by mouth daily.   zolpidem  (AMBIEN ) 10 MG tablet TAKE 1 TABLET(10 MG) BY MOUTH AT BEDTIME AS NEEDED FOR SLEEP   No facility-administered encounter medications on file as of 10/04/2024.    Allergies  (verified) Patient has no known allergies.   History: Past Medical History:  Diagnosis Date   Allergic rhinitis    Anxiety    Arthritis    Cataract    Depression    on meds   Head injury    hairline fx from fall out of swing-    Head injury without skull fracture    no LOC   Hypertension    Past Surgical History:  Procedure Laterality Date   ABDOMINAL HYSTERECTOMY  1990s   ANTERIOR AND POSTERIOR REPAIR  07/07/2012   Procedure: ANTERIOR (CYSTOCELE) AND POSTERIOR REPAIR (RECTOCELE);  Surgeon: Rosaline DELENA Luna, MD;  Location: [facility];  Service: Gynecology;  Laterality: N/A;   BLADDER SUSPENSION  07/07/2012   Procedure: TRANSVAGINAL TAPE (TVT) PROCEDURE;  Surgeon: Rosaline DELENA Luna, MD;  Location: [facility];  Service: Gynecology;  Laterality: N/A;   CATARACT EXTRACTION Right    [facility]   CATARACT EXTRACTION Left    [place]   CHOLECYSTECTOMY  2009   COLONOSCOPY  2021   JMP-MAC-prep good-TA/tics/hems   CYSTOSCOPY  07/07/2012   Procedure: CYSTOSCOPY;  Surgeon: Rosaline DELENA Luna, MD;  Location: [facility];  Service: Gynecology;;   POLYPECTOMY  2021   TA   TOTAL HIP ARTHROPLASTY Right    TOTAL HIP ARTHROPLASTY Left 04/2023   Family History  Problem Relation Age of Onset   Colon polyps Mother    Leukemia Mother    Hypertension Mother    Cancer Father        skin   Hypertension Father    Lung cancer Brother    Colon cancer Neg Hx    Stomach cancer Neg Hx    Esophageal cancer Neg Hx    Rectal cancer Neg Hx    Social History   Socioeconomic History   Marital status: Married    Spouse name: Not on file   Number of children: 2   Years of education: Not on file   Highest education level: Not on file  Occupational History   Not on file  Tobacco Use   Smoking status: Never   Smokeless tobacco: Never  Vaping Use   Vaping status: Never Used  Substance and Sexual Activity   Alcohol use: Not Currently   Drug use: No   Sexual activity: Not Currently  Other Topics  Concern   Not on file  Social History Narrative   Not on file   Social Drivers of Health   Financial Resource Strain: Low Risk  (10/04/2024)   Overall Financial Resource Strain (CARDIA)    Difficulty of Paying Living Expenses: Not hard at all  Food Insecurity: No Food Insecurity (10/04/2024)   Hunger Vital Sign    Worried About Running Out of Food in the Last Year: Never true    Ran Out of Food in the Last Year: Never true  Transportation Needs: No Transportation Needs (10/04/2024)   PRAPARE - Administrator, Civil Service (Medical): No    Lack of Transportation (Non-Medical): No  Physical Activity: Insufficiently Active (10/04/2024)   Exercise Vital  Sign    Days of Exercise per Week: 3 days    Minutes of Exercise per Session: 30 min  Stress: No Stress Concern Present (10/04/2024)   [employer] of Occupational Health - Occupational Stress Questionnaire    Feeling of Stress: Only a little  Social Connections: Moderately Isolated (10/04/2024)   Social Connection and Isolation Panel    Frequency of Communication with Friends and Family: More than three times a week    Frequency of Social Gatherings with Friends and Family: Three times a week    Attends Religious Services: Never    Active Member of Clubs or Organizations: No    Attends Banker Meetings: Never    Marital Status: Married    Tobacco Counseling Counseling given: Not Answered    Clinical Intake:  Pre-visit preparation completed: Yes  Pain : No/denies pain     BMI - recorded: 33.3 Nutritional Status: BMI > 30  Obese Nutritional Risks: None Diabetes: No  Lab Results  Component Value Date   HGBA1C 5.0 09/01/2012     How often do you need to have someone help you when you read instructions, pamphlets, or other written materials from your doctor or pharmacy?: 1 - Never  Interpreter Needed?: No  Information entered by :: Zeynab Klett,cma   Activities of Daily Living      10/04/2024    9:57 AM  In your present state of health, do you have any difficulty performing the following activities:  Hearing? 0  Vision? 0  Difficulty concentrating or making decisions? 0  Walking or climbing stairs? 0  Dressing or bathing? 0  Doing errands, shopping? 0  Preparing Food and eating ? N  Using the Toilet? N  In the past six months, have you accidently leaked urine? N  Do you have problems with loss of bowel control? N  Managing your Medications? N  Managing your Finances? N  Housekeeping or managing your Housekeeping? N    Patient Care Team: Mahlon Comer BRAVO, MD as PCP - General (Family Medicine) Sarrah Browning, MD as Consulting Physician (Obstetrics and Gynecology) Albertus, Gordy HERO, MD as Consulting Physician (Gastroenterology)  I have updated your Care Teams any recent Medical Services you may have received from other providers in the past year.     Assessment:   This is a routine wellness examination for Diana Vincent.  Hearing/Vision screen Hearing Screening - Comments:: Patient wears hearing aids Vision Screening - Comments:: Patient wears glasses    Goals Addressed               This Visit's Progress     Patient Stated (pt-stated)        Patient would like to spend more time with family       Depression Screen     10/04/2024    9:59 AM 08/26/2024   10:21 AM 01/22/2024    1:25 PM 08/14/2023   10:17 AM 07/30/2023    9:43 AM 04/07/2023   10:49 AM 01/29/2023   10:17 AM  PHQ 2/9 Scores  PHQ - 2 Score 0 1 2 2  0 0 0  PHQ- 9 Score 1 1 6 8  0 2 1    Fall Risk     10/04/2024    9:56 AM 01/22/2024    1:25 PM 10/09/2023   12:48 PM 08/14/2023   10:17 AM 07/30/2023    9:55 AM  Fall Risk   Falls in the past year? 0 0 0 1 0  Number  falls in past yr: 0  0 0 0  Injury with Fall? 0  0 0 0  Risk for fall due to : No Fall Risks No Fall Risks No Fall Risks No Fall Risks;History of fall(s)   Follow up Falls evaluation completed Falls evaluation completed  Falls evaluation completed Falls evaluation completed Falls evaluation completed;Education provided;Falls prevention discussed    MEDICARE RISK AT HOME:  Medicare Risk at Home Any stairs in or around the home?: Yes If so, are there any without handrails?: No Home free of loose throw rugs in walkways, pet beds, electrical cords, etc?: Yes Adequate lighting in your home to reduce risk of falls?: Yes Life alert?: No Use of a cane, walker or w/c?: No Grab bars in the bathroom?: No Shower chair or bench in shower?: No Elevated toilet seat or a handicapped toilet?: No  TIMED UP AND GO:  Was the test performed?  No  Cognitive Function: 6CIT completed        10/04/2024   10:00 AM 07/30/2023    9:36 AM 01/29/2023   10:14 AM  6CIT Screen  What Year? 0 points 0 points 0 points  What month? 0 points 0 points 0 points  What time? 0 points 0 points 0 points  Count back from 20 0 points 0 points 0 points  Months in reverse 0 points 0 points 0 points  Repeat phrase 0 points 0 points 0 points  Total Score 0 points 0 points 0 points    Immunizations Immunization History  Administered Date(s) Administered   INFLUENZA, HIGH DOSE SEASONAL PF 08/30/2023   Influenza,inj,Quad PF,6+ Mos 10/26/2016, 11/01/2017, 10/08/2018, 09/05/2019, 09/26/2020   Influenza,inj,quad, With Preservative 10/15/2017   Influenza-Unspecified 10/17/2017, 10/03/2021, 10/03/2022   PFIZER(Purple Top)SARS-COV-2 Vaccination 02/10/2020, 03/03/2020, 11/14/2020   PNEUMOCOCCAL CONJUGATE-20 12/20/2021   Pfizer(Comirnaty)Fall Seasonal Vaccine 12 years and older 10/03/2022   Td 10/12/2009   Tdap 11/01/2017   Zoster Recombinant(Shingrix) 08/28/2023    Screening Tests Health Maintenance  Topic Date Due   Hepatitis C Screening  Never done   Zoster Vaccines- Shingrix (2 of 2) 10/23/2023   Mammogram  12/11/2023   Influenza Vaccine  07/15/2024   COVID-19 Vaccine (5 - 2025-26 season) 08/15/2024   Medicare Annual Wellness (AWV)   10/04/2025   Colonoscopy  11/05/2026   DTaP/Tdap/Td (3 - Td or Tdap) 11/02/2027   Pneumococcal Vaccine: 50+ Years  Completed   DEXA SCAN  Completed   Meningococcal B Vaccine  Aged Out    Health Maintenance Items Addressed:patient declined  Additional Screening:  Vision Screening: Recommended annual ophthalmology exams for early detection of glaucoma and other disorders of the eye. Is the patient up to date with their annual eye exam?  yes Dental Screening: Recommended annual dental exams for proper oral hygiene  Community Resource Referral / Chronic Care Management: CRR required this visit?  No   CCM required this visit?  No   Plan:    I have personally reviewed and noted the following in the patient's chart:   Medical and social history Use of alcohol, tobacco or illicit drugs  Current medications and supplements including opioid prescriptions. Patient is not currently taking opioid prescriptions. Functional ability and status Nutritional status Physical activity Advanced directives List of other physicians Hospitalizations, surgeries, and ER visits in previous 12 months Vitals Screenings to include cognitive, depression, and falls Referrals and appointments  In addition, I have reviewed and discussed with patient certain preventive protocols, quality metrics, and best practice recommendations. A written  personalized care plan for preventive services as well as general preventive health recommendations were provided to patient.   Lyle MARLA Right, [place]   10/04/2024   After Visit Summary: (MyChart) Due to this being a telephonic visit, the after visit summary with patients personalized plan was offered to patient via MyChart   Notes: Nothing significant to report at this time.

## 2024-10-04 NOTE — Patient Instructions (Signed)
 Diana Vincent,  Thank you for taking the time for your Medicare Wellness Visit. I appreciate your continued commitment to your health goals. Please review the care plan we discussed, and feel free to reach out if I can assist you further.  Medicare recommends these wellness visits once per year to help you and your care team stay ahead of potential health issues. These visits are designed to focus on prevention, allowing your provider to concentrate on managing your acute and chronic conditions during your regular appointments.  Please note that Annual Wellness Visits do not include a physical exam. Some assessments may be limited, especially if the visit was conducted virtually. If needed, we may recommend a separate in-person follow-up with your provider.  Ongoing Care Seeing your primary care provider every 3 to 6 months helps us  monitor your health and provide consistent, personalized care.   Referrals If a referral was made during today's visit and you haven't received any updates within two weeks, please contact the referred provider directly to check on the status.  Recommended Screenings:  Health Maintenance  Topic Date Due   Hepatitis C Screening  Never done   Zoster (Shingles) Vaccine (2 of 2) 10/23/2023   Breast Cancer Screening  12/11/2023   Flu Shot  07/15/2024   COVID-19 Vaccine (5 - 2025-26 season) 08/15/2024   Medicare Annual Wellness Visit  10/04/2025   Colon Cancer Screening  11/05/2026   DTaP/Tdap/Td vaccine (3 - Td or Tdap) 11/02/2027   Pneumococcal Vaccine for age over 44  Completed   DEXA scan (bone density measurement)  Completed   Meningitis B Vaccine  Aged Out       10/04/2024    9:53 AM  Advanced Directives  Does Patient Have a Medical Advance Directive? No  Would patient like information on creating a medical advance directive? No - Patient declined   Advance Care Planning is important because it: Ensures you receive medical care that aligns with your  values, goals, and preferences. Provides guidance to your family and loved ones, reducing the emotional burden of decision-making during critical moments.  Vision: Annual vision screenings are recommended for early detection of glaucoma, cataracts, and diabetic retinopathy. These exams can also reveal signs of chronic conditions such as diabetes and high blood pressure.  Dental: Annual dental screenings help detect early signs of oral cancer, gum disease, and other conditions linked to overall health, including heart disease and diabetes.  Please see the attached documents for additional preventive care recommendations.

## 2024-10-20 DIAGNOSIS — H938X2 Other specified disorders of left ear: Secondary | ICD-10-CM | POA: Diagnosis not present

## 2024-10-20 DIAGNOSIS — H903 Sensorineural hearing loss, bilateral: Secondary | ICD-10-CM | POA: Diagnosis not present

## 2024-10-20 DIAGNOSIS — H6992 Unspecified Eustachian tube disorder, left ear: Secondary | ICD-10-CM | POA: Diagnosis not present

## 2024-10-20 DIAGNOSIS — H6122 Impacted cerumen, left ear: Secondary | ICD-10-CM | POA: Diagnosis not present

## 2024-10-24 DIAGNOSIS — Z85820 Personal history of malignant melanoma of skin: Secondary | ICD-10-CM | POA: Diagnosis not present

## 2024-10-24 DIAGNOSIS — D2271 Melanocytic nevi of right lower limb, including hip: Secondary | ICD-10-CM | POA: Diagnosis not present

## 2024-10-24 DIAGNOSIS — L821 Other seborrheic keratosis: Secondary | ICD-10-CM | POA: Diagnosis not present

## 2024-10-24 DIAGNOSIS — L2089 Other atopic dermatitis: Secondary | ICD-10-CM | POA: Diagnosis not present

## 2024-10-24 DIAGNOSIS — Z85828 Personal history of other malignant neoplasm of skin: Secondary | ICD-10-CM | POA: Diagnosis not present

## 2024-10-24 DIAGNOSIS — D485 Neoplasm of uncertain behavior of skin: Secondary | ICD-10-CM | POA: Diagnosis not present

## 2024-10-24 DIAGNOSIS — D2272 Melanocytic nevi of left lower limb, including hip: Secondary | ICD-10-CM | POA: Diagnosis not present

## 2024-10-24 DIAGNOSIS — D225 Melanocytic nevi of trunk: Secondary | ICD-10-CM | POA: Diagnosis not present

## 2024-10-24 DIAGNOSIS — L814 Other melanin hyperpigmentation: Secondary | ICD-10-CM | POA: Diagnosis not present

## 2024-10-31 DIAGNOSIS — I10 Essential (primary) hypertension: Secondary | ICD-10-CM | POA: Diagnosis not present

## 2024-10-31 DIAGNOSIS — E669 Obesity, unspecified: Secondary | ICD-10-CM | POA: Diagnosis not present

## 2024-10-31 DIAGNOSIS — E538 Deficiency of other specified B group vitamins: Secondary | ICD-10-CM | POA: Diagnosis not present

## 2025-01-04 ENCOUNTER — Other Ambulatory Visit: Payer: Self-pay

## 2025-01-04 MED ORDER — ZOLPIDEM TARTRATE 10 MG PO TABS: ORAL_TABLET | ORAL | 3 refills | Status: AC

## 2025-01-04 NOTE — Telephone Encounter (Signed)
 Requested Prescriptions   Pending Prescriptions Disp Refills   zolpidem  (AMBIEN ) 10 MG tablet 30 tablet 3    Sig: TAKE 1 TABLET(10 MG) BY MOUTH AT BEDTIME AS NEEDED FOR SLEEP     Date of patient request: 01/04/2025 Last office visit: 09/26/2024 Upcoming visit: Visit date not found Date of last refill: 08/26/2024 Last refill amount: 30x3

## 2025-01-05 ENCOUNTER — Other Ambulatory Visit: Payer: Self-pay | Admitting: Family Medicine

## 2025-01-05 DIAGNOSIS — I10 Essential (primary) hypertension: Secondary | ICD-10-CM

## 2025-01-12 ENCOUNTER — Encounter: Payer: Self-pay | Admitting: Family Medicine

## 2025-01-12 ENCOUNTER — Ambulatory Visit: Admitting: Family Medicine

## 2025-01-12 VITALS — BP 114/70 | HR 87 | Ht 64.0 in | Wt 202.4 lb

## 2025-01-12 DIAGNOSIS — I10 Essential (primary) hypertension: Secondary | ICD-10-CM

## 2025-01-12 DIAGNOSIS — N3281 Overactive bladder: Secondary | ICD-10-CM

## 2025-01-12 LAB — LIPID PANEL
Cholesterol: 115 mg/dL (ref 28–200)
HDL: 58.2 mg/dL
LDL Cholesterol: 37 mg/dL (ref 10–99)
NonHDL: 56.69
Total CHOL/HDL Ratio: 2
Triglycerides: 99 mg/dL (ref 10.0–149.0)
VLDL: 19.8 mg/dL (ref 0.0–40.0)

## 2025-01-12 LAB — HEPATIC FUNCTION PANEL
ALT: 17 U/L (ref 3–35)
AST: 17 U/L (ref 5–37)
Albumin: 4.2 g/dL (ref 3.5–5.2)
Alkaline Phosphatase: 89 U/L (ref 39–117)
Bilirubin, Direct: 0.1 mg/dL (ref 0.1–0.3)
Total Bilirubin: 0.4 mg/dL (ref 0.2–1.2)
Total Protein: 7.2 g/dL (ref 6.0–8.3)

## 2025-01-12 LAB — CBC WITH DIFFERENTIAL/PLATELET
Basophils Absolute: 0 10*3/uL (ref 0.0–0.1)
Basophils Relative: 0.4 % (ref 0.0–3.0)
Eosinophils Absolute: 0.1 10*3/uL (ref 0.0–0.7)
Eosinophils Relative: 1.4 % (ref 0.0–5.0)
HCT: 42.3 % (ref 36.0–46.0)
Hemoglobin: 14.1 g/dL (ref 12.0–15.0)
Lymphocytes Relative: 18.9 % (ref 12.0–46.0)
Lymphs Abs: 1.4 10*3/uL (ref 0.7–4.0)
MCHC: 33.4 g/dL (ref 30.0–36.0)
MCV: 89.5 fl (ref 78.0–100.0)
Monocytes Absolute: 0.8 10*3/uL (ref 0.1–1.0)
Monocytes Relative: 10.8 % (ref 3.0–12.0)
Neutro Abs: 5.1 10*3/uL (ref 1.4–7.7)
Neutrophils Relative %: 68.5 % (ref 43.0–77.0)
Platelets: 368 10*3/uL (ref 150.0–400.0)
RBC: 4.72 Mil/uL (ref 3.87–5.11)
RDW: 14.3 % (ref 11.5–15.5)
WBC: 7.4 10*3/uL (ref 4.0–10.5)

## 2025-01-12 LAB — BASIC METABOLIC PANEL WITH GFR
BUN: 16 mg/dL (ref 6–23)
CO2: 31 meq/L (ref 19–32)
Calcium: 9.9 mg/dL (ref 8.4–10.5)
Chloride: 102 meq/L (ref 96–112)
Creatinine, Ser: 0.59 mg/dL (ref 0.40–1.20)
GFR: 92.78 mL/min
Glucose, Bld: 94 mg/dL (ref 70–99)
Potassium: 4.6 meq/L (ref 3.5–5.1)
Sodium: 139 meq/L (ref 135–145)

## 2025-01-12 LAB — TSH: TSH: 2.33 u[IU]/mL (ref 0.35–5.50)

## 2025-01-12 MED ORDER — OLMESARTAN MEDOXOMIL-HCTZ 40-12.5 MG PO TABS: 1.0000 | ORAL_TABLET | Freq: Every day | ORAL | 1 refills | Status: AC

## 2025-01-12 MED ORDER — MYRBETRIQ 50 MG PO TB24: 50.0000 mg | ORAL_TABLET | Freq: Every day | ORAL | 1 refills | Status: AC

## 2025-01-12 MED ORDER — BUPROPION HCL ER (XL) 150 MG PO TB24: 150.0000 mg | ORAL_TABLET | Freq: Every day | ORAL | 1 refills | Status: AC

## 2025-01-12 NOTE — Progress Notes (Signed)
" ° °  Subjective:    Patient ID: Diana Vincent, female    DOB: 11-Mar-1956, 69 y.o.   MRN: [id number]  HPI HTN- chronic problem, on Olmesartan  hydrochlorothiazide  40/12.5mg  daily w/ excellent control.  Had vasovagal episode in October and Amlodipine  was stopped.  No CP, SOB, HA's, visual changes, edema.  OAB- has been getting Myrbetriq  from GYN but was told that she no longer needs to be seen there.  Wants me to assume prescribing.   Review of Systems For ROS see HPI     Objective:   Physical Exam Vitals reviewed.  Constitutional:      General: She is not in acute distress.    Appearance: Normal appearance. She is well-developed. She is not ill-appearing.  HENT:     Head: Normocephalic and atraumatic.  Eyes:     Conjunctiva/sclera: Conjunctivae normal.     Pupils: Pupils are equal, round, and reactive to light.  Neck:     Thyroid : No thyromegaly.  Cardiovascular:     Rate and Rhythm: Normal rate and regular rhythm.     Pulses: Normal pulses.     Heart sounds: Normal heart sounds. No murmur heard. Pulmonary:     Effort: Pulmonary effort is normal. No respiratory distress.     Breath sounds: Normal breath sounds.  Abdominal:     General: There is no distension.     Palpations: Abdomen is soft.     Tenderness: There is no abdominal tenderness.  Musculoskeletal:     Cervical back: Normal range of motion and neck supple.     Right lower leg: No edema.     Left lower leg: No edema.  Lymphadenopathy:     Cervical: No cervical adenopathy.  Skin:    General: Skin is warm and dry.  Neurological:     General: No focal deficit present.     Mental Status: She is alert and oriented to person, place, and time.  Psychiatric:        Mood and Affect: Mood normal.        Behavior: Behavior normal.        Thought Content: Thought content normal.           Assessment & Plan:    "

## 2025-01-12 NOTE — Patient Instructions (Signed)
Schedule your complete physical in 6 months We'll notify you of your lab results and make any changes if needed Keep up the good work on healthy diet and regular exercise- you look great! Call with any questions or concerns Stay Safe!  Stay Healthy! Hang in there!

## 2025-01-13 ENCOUNTER — Ambulatory Visit: Payer: Self-pay | Admitting: Family Medicine

## 2025-01-13 NOTE — Progress Notes (Signed)
 Lab results have been discussed.   Verbalized understanding? Yes  Are there any questions? No

## 2025-01-14 NOTE — Assessment & Plan Note (Signed)
Prescription sent for [facility]

## 2025-01-14 NOTE — Assessment & Plan Note (Signed)
 Chronic problem.  On Olmesartan  hydrochlorothiazide  40/12.5mg  daily w/ excellent BP control.  Currently asymptomatic.  Check labs due to ARB and diuretic use but no anticipated med changes.

## 2025-01-16 ENCOUNTER — Other Ambulatory Visit: Payer: Self-pay | Admitting: Family Medicine
